# Patient Record
Sex: Female | Born: 1950 | Race: Black or African American | Hispanic: No | Smoking: Former smoker
Health system: Southern US, Community
[De-identification: ages and names within clinical notes are randomized; demographics above are authoritative.]

## PROBLEM LIST (undated history)

## (undated) DIAGNOSIS — I10 Essential (primary) hypertension: Secondary | ICD-10-CM

## (undated) DIAGNOSIS — J159 Unspecified bacterial pneumonia: Secondary | ICD-10-CM

## (undated) DIAGNOSIS — E785 Hyperlipidemia, unspecified: Secondary | ICD-10-CM

## (undated) DIAGNOSIS — R011 Cardiac murmur, unspecified: Secondary | ICD-10-CM

## (undated) DIAGNOSIS — C801 Malignant (primary) neoplasm, unspecified: Secondary | ICD-10-CM

## (undated) DIAGNOSIS — J45909 Unspecified asthma, uncomplicated: Secondary | ICD-10-CM

## (undated) DIAGNOSIS — G4730 Sleep apnea, unspecified: Secondary | ICD-10-CM

## (undated) HISTORY — DX: Essential (primary) hypertension: I10

## (undated) HISTORY — DX: Hyperlipidemia, unspecified: E78.5

## (undated) HISTORY — DX: Unspecified asthma, uncomplicated: J45.909

## (undated) HISTORY — DX: Unspecified bacterial pneumonia: J15.9

## (undated) HISTORY — DX: Sleep apnea, unspecified: G47.30

## (undated) HISTORY — DX: Morbid (severe) obesity due to excess calories: E66.01

---
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
## 2002-02-20 ENCOUNTER — Encounter: Payer: Self-pay | Admitting: Family Medicine

## 2002-02-20 ENCOUNTER — Ambulatory Visit (HOSPITAL_COMMUNITY): Admission: RE | Admit: 2002-02-20 | Discharge: 2002-02-20 | Payer: Self-pay | Admitting: Family Medicine

## 2002-09-19 ENCOUNTER — Ambulatory Visit (HOSPITAL_COMMUNITY): Admission: RE | Admit: 2002-09-19 | Discharge: 2002-09-19 | Payer: Self-pay | Admitting: Family Medicine

## 2002-09-19 ENCOUNTER — Encounter: Payer: Self-pay | Admitting: Family Medicine

## 2002-11-20 ENCOUNTER — Emergency Department (HOSPITAL_COMMUNITY): Admission: EM | Admit: 2002-11-20 | Discharge: 2002-11-20 | Payer: Self-pay | Admitting: Emergency Medicine

## 2002-12-08 ENCOUNTER — Emergency Department (HOSPITAL_COMMUNITY): Admission: EM | Admit: 2002-12-08 | Discharge: 2002-12-08 | Payer: Self-pay | Admitting: Emergency Medicine

## 2005-02-10 ENCOUNTER — Ambulatory Visit: Payer: Self-pay | Admitting: Family Medicine

## 2005-02-15 ENCOUNTER — Ambulatory Visit: Payer: Self-pay | Admitting: Family Medicine

## 2005-02-17 ENCOUNTER — Ambulatory Visit (HOSPITAL_COMMUNITY): Admission: RE | Admit: 2005-02-17 | Discharge: 2005-02-17 | Payer: Self-pay | Admitting: Family Medicine

## 2005-04-13 ENCOUNTER — Ambulatory Visit: Payer: Self-pay | Admitting: Family Medicine

## 2005-05-31 ENCOUNTER — Ambulatory Visit: Payer: Self-pay | Admitting: Family Medicine

## 2005-09-27 ENCOUNTER — Ambulatory Visit: Payer: Self-pay | Admitting: Family Medicine

## 2005-10-13 ENCOUNTER — Ambulatory Visit: Payer: Self-pay | Admitting: Family Medicine

## 2006-02-08 ENCOUNTER — Ambulatory Visit: Payer: Self-pay | Admitting: Family Medicine

## 2006-04-17 ENCOUNTER — Ambulatory Visit: Payer: Self-pay | Admitting: Family Medicine

## 2006-05-09 ENCOUNTER — Ambulatory Visit: Payer: Self-pay | Admitting: Family Medicine

## 2006-09-19 ENCOUNTER — Ambulatory Visit: Payer: Self-pay | Admitting: Family Medicine

## 2006-10-23 ENCOUNTER — Ambulatory Visit (HOSPITAL_COMMUNITY): Admission: RE | Admit: 2006-10-23 | Discharge: 2006-10-23 | Payer: Self-pay | Admitting: Family Medicine

## 2006-11-28 ENCOUNTER — Encounter: Payer: Self-pay | Admitting: Family Medicine

## 2006-11-28 LAB — CONVERTED CEMR LAB
ALT: 8 units/L (ref 0–35)
AST: 12 units/L (ref 0–37)
BUN: 11 mg/dL (ref 6–23)
Basophils Relative: 0 % (ref 0–1)
Chloride: 101 meq/L (ref 96–112)
Cholesterol: 207 mg/dL — ABNORMAL HIGH (ref 0–200)
Eosinophils Absolute: 0.2 10*3/uL (ref 0.0–0.7)
Eosinophils Relative: 3 % (ref 0–5)
Glucose, Bld: 94 mg/dL (ref 70–99)
LDL Cholesterol: 131 mg/dL — ABNORMAL HIGH (ref 0–99)
Lymphs Abs: 2.2 10*3/uL (ref 0.7–3.3)
Monocytes Absolute: 0.5 10*3/uL (ref 0.2–0.7)
Monocytes Relative: 7 % (ref 3–11)
Potassium: 4.1 meq/L (ref 3.5–5.3)
RBC: 5.19 M/uL — ABNORMAL HIGH (ref 3.87–5.11)
RDW: 15.6 % — ABNORMAL HIGH (ref 11.5–14.0)
Sodium: 139 meq/L (ref 135–145)
Total Bilirubin: 0.6 mg/dL (ref 0.3–1.2)
VLDL: 15 mg/dL (ref 0–40)
WBC: 7.6 10*3/uL (ref 4.0–10.5)

## 2006-12-04 ENCOUNTER — Ambulatory Visit: Payer: Self-pay | Admitting: Family Medicine

## 2007-05-14 ENCOUNTER — Ambulatory Visit: Payer: Self-pay | Admitting: Family Medicine

## 2007-09-25 ENCOUNTER — Ambulatory Visit: Payer: Self-pay | Admitting: Family Medicine

## 2007-09-27 DIAGNOSIS — E66813 Obesity, class 3: Secondary | ICD-10-CM | POA: Insufficient documentation

## 2007-09-27 DIAGNOSIS — Z6841 Body Mass Index (BMI) 40.0 and over, adult: Secondary | ICD-10-CM

## 2007-09-27 DIAGNOSIS — I10 Essential (primary) hypertension: Secondary | ICD-10-CM

## 2007-09-27 DIAGNOSIS — J45991 Cough variant asthma: Secondary | ICD-10-CM

## 2007-11-18 ENCOUNTER — Telehealth: Payer: Self-pay | Admitting: Family Medicine

## 2007-11-23 ENCOUNTER — Emergency Department (HOSPITAL_COMMUNITY): Admission: EM | Admit: 2007-11-23 | Discharge: 2007-11-23 | Payer: Self-pay | Admitting: Emergency Medicine

## 2008-05-13 ENCOUNTER — Ambulatory Visit: Payer: Self-pay | Admitting: Family Medicine

## 2008-05-16 ENCOUNTER — Encounter: Payer: Self-pay | Admitting: Family Medicine

## 2009-01-15 ENCOUNTER — Ambulatory Visit: Payer: Self-pay | Admitting: Family Medicine

## 2009-01-15 DIAGNOSIS — R5383 Other fatigue: Secondary | ICD-10-CM

## 2009-01-15 DIAGNOSIS — R5381 Other malaise: Secondary | ICD-10-CM

## 2009-01-15 DIAGNOSIS — H547 Unspecified visual loss: Secondary | ICD-10-CM | POA: Insufficient documentation

## 2009-01-16 ENCOUNTER — Telehealth: Payer: Self-pay | Admitting: Family Medicine

## 2009-01-16 DIAGNOSIS — B369 Superficial mycosis, unspecified: Secondary | ICD-10-CM | POA: Insufficient documentation

## 2009-01-19 ENCOUNTER — Telehealth: Payer: Self-pay | Admitting: Family Medicine

## 2009-05-26 ENCOUNTER — Encounter (INDEPENDENT_AMBULATORY_CARE_PROVIDER_SITE_OTHER): Payer: Self-pay | Admitting: *Deleted

## 2009-08-19 ENCOUNTER — Encounter: Payer: Self-pay | Admitting: Family Medicine

## 2009-08-20 ENCOUNTER — Inpatient Hospital Stay (HOSPITAL_COMMUNITY): Admission: EM | Admit: 2009-08-20 | Discharge: 2009-08-22 | Payer: Self-pay | Admitting: Emergency Medicine

## 2009-08-22 ENCOUNTER — Encounter: Payer: Self-pay | Admitting: Family Medicine

## 2009-08-24 ENCOUNTER — Telehealth: Payer: Self-pay | Admitting: Family Medicine

## 2009-08-26 ENCOUNTER — Telehealth: Payer: Self-pay | Admitting: Physician Assistant

## 2009-08-27 ENCOUNTER — Encounter: Payer: Self-pay | Admitting: Family Medicine

## 2009-09-10 ENCOUNTER — Ambulatory Visit: Payer: Self-pay | Admitting: Family Medicine

## 2009-09-10 DIAGNOSIS — J159 Unspecified bacterial pneumonia: Secondary | ICD-10-CM | POA: Insufficient documentation

## 2009-09-10 DIAGNOSIS — G4730 Sleep apnea, unspecified: Secondary | ICD-10-CM | POA: Insufficient documentation

## 2009-09-11 ENCOUNTER — Encounter: Payer: Self-pay | Admitting: Family Medicine

## 2009-09-14 ENCOUNTER — Telehealth: Payer: Self-pay | Admitting: Family Medicine

## 2009-09-21 ENCOUNTER — Telehealth: Payer: Self-pay | Admitting: Family Medicine

## 2009-10-26 ENCOUNTER — Telehealth: Payer: Self-pay | Admitting: Family Medicine

## 2009-12-15 ENCOUNTER — Emergency Department (HOSPITAL_COMMUNITY): Admission: EM | Admit: 2009-12-15 | Discharge: 2009-12-15 | Payer: Self-pay | Admitting: Emergency Medicine

## 2009-12-28 ENCOUNTER — Telehealth: Payer: Self-pay | Admitting: Family Medicine

## 2010-01-11 ENCOUNTER — Ambulatory Visit: Payer: Self-pay | Admitting: Family Medicine

## 2010-01-11 DIAGNOSIS — R7301 Impaired fasting glucose: Secondary | ICD-10-CM

## 2010-01-12 ENCOUNTER — Encounter: Payer: Self-pay | Admitting: Family Medicine

## 2010-01-14 LAB — CONVERTED CEMR LAB
Basophils Absolute: 0 10*3/uL (ref 0.0–0.1)
Calcium: 10 mg/dL (ref 8.4–10.5)
Chloride: 101 meq/L (ref 96–112)
Cholesterol: 205 mg/dL — ABNORMAL HIGH (ref 0–200)
Glucose, Bld: 101 mg/dL — ABNORMAL HIGH (ref 70–99)
HCT: 42.6 % (ref 36.0–46.0)
HDL: 50 mg/dL (ref >39)
Hemoglobin: 13.7 g/dL (ref 12.0–15.0)
LDL Cholesterol: 125 mg/dL — ABNORMAL HIGH (ref 0–99)
Lymphocytes Relative: 31 % (ref 12–46)
MCHC: 32.2 g/dL (ref 30.0–36.0)
Potassium: 4.2 meq/L (ref 3.5–5.3)
TSH: 1.609 microintl units/mL (ref 0.350–4.500)
Triglycerides: 149 mg/dL (ref <150)

## 2010-05-17 ENCOUNTER — Encounter: Payer: Self-pay | Admitting: Family Medicine

## 2010-05-24 ENCOUNTER — Ambulatory Visit: Payer: Self-pay | Admitting: Family Medicine

## 2010-05-31 ENCOUNTER — Encounter: Payer: Self-pay | Admitting: Family Medicine

## 2010-05-31 LAB — CONVERTED CEMR LAB: Hgb A1c MFr Bld: 5.6 % (ref <5.7)

## 2010-07-04 ENCOUNTER — Encounter: Payer: Self-pay | Admitting: Family Medicine

## 2010-07-05 ENCOUNTER — Encounter: Payer: Self-pay | Admitting: Family Medicine

## 2010-07-13 NOTE — Assessment & Plan Note (Signed)
Summary: office visit   Vital Signs:  Patient profile:   60 year old female Menstrual status:  postmenopausal Height:      65 inches Weight:      345 pounds BMI:     57.62 O2 Sat:      92 % Pulse rate:   78 / minute Pulse rhythm:   regular Resp:     16 per minute BP sitting:   130 / 78  (left arm) Cuff size:   xl  Vitals Entered By: Everitt Amber LPN (September 10, 2009 3:11 PM)  Nutrition Counseling: Patient's BMI is greater than 25 and therefore counseled on weight management options. CC: ER follow up for pneumonia   CC:  ER follow up for pneumonia.  History of Present Illness: Pt hospitalised March 9 to 12 for left lower lobe pneumonia, she was also d/cd on oxygen for use with ambulation through advanced.  She has gained weight, and states she has neither been exercising or sticking with a diet to lose weight. She denies depression or anxiety. Shedenies wheezing or chest congestion with productive cough. She has some stiffness of the large joints as wel as back pain. Sehc/o high cost of her inhalers and is requesting help.  Pt aMBULATED IN THE OFFICE AND DESATRATED UNDER 90%WITHIN 3 MONUTES. She reports excessive snoring and daytime sleepiness, needs sleep study  Current Medications (verified): 1)  Proventil Hfa 108 (90 Base) Mcg/act  Aers (Albuterol Sulfate) .... Two Puffs Every Four To Six Hours As Needed 2)  Symbicort 80-4.5 Mcg/act  Aero (Budesonide-Formoterol Fumarate) .... Two Puffs Two Times A Day 3)  Maxzide-25 37.5-25 Mg Tabs (Triamterene-Hctz) .... Take 1 Tablet By Mouth Once A Day  Allergies (verified): 1)  ! * Tessalon Perles 2)  ! * Altace  Review of Systems      See HPI General:  Complains of fatigue. Eyes:  Denies blurring and discharge. ENT:  Denies nasal congestion, postnasal drainage, and sinus pressure. CV:  Denies chest pain or discomfort, palpitations, and swelling of feet. GI:  Denies abdominal pain, constipation, diarrhea, nausea, and  vomiting. GU:  Denies dysuria and urinary frequency. MS:  See HPI. Endo:  Denies cold intolerance, excessive hunger, excessive thirst, excessive urination, heat intolerance, polyuria, and weight change. Heme:  Denies abnormal bruising and bleeding. Allergy:  Complains of seasonal allergies.  Physical Exam  General:  alert, well-hydrated, and overweight-appearing.  HEENT: No facial asymmetry,  EOMI, No sinus tenderness, TM's Clear, oropharynx  pink and moist.   Chest:adequate airentry throughout, no crackles or wheezes CVS: S1, S2, No murmurs, No S3.   Abd: Soft, Nontender.  MS: Adequate ROM spine, hips, shoulders and knees.  Ext: No edema.   CNS: CN 2-12 intact, power tone and sensation normal throughout.   Skinno breakldown or exudate Psych: Good eye contact, normal affect.  Memory intact, not anxious or depressed appearing.     Impression & Recommendations:  Problem # 1:  BACTERIAL PNEUMONIA (ICD-482.9) Assessment Improved  Orders: CXR- 2view (CXR), rept between 4 to 6 weeks after presentation to ensure clearing  Problem # 2:  ASTHMA (ICD-493.90) Assessment: Improved  The following medications were removed from the medication list:    Albuterol Sulfate (2.5 Mg/77ml) 0.083% Nebu (Albuterol sulfate) ..... One q 4 hrs as needed via nebulizer Her updated medication list for this problem includes:    Proventil Hfa 108 (90 Base) Mcg/act Aers (Albuterol sulfate) .Marland Kitchen..Marland Kitchen Two puffs every four to six hours as needed  Symbicort 80-4.5 Mcg/act Aero (Budesonide-formoterol fumarate) .Marland Kitchen..Marland Kitchen Two puffs two times a day  Problem # 3:  HYPERTENSION (ICD-401.9) Assessment: Unchanged  Her updated medication list for this problem includes:    Maxzide-25 37.5-25 Mg Tabs (Triamterene-hctz) .Marland Kitchen... Take 1 tablet by mouth once a day  BP today: 130/78 Prior BP: 110/80 (01/15/2009)  Labs Reviewed: K+: 4.1 (11/28/2006) Creat: : 0.64 (11/28/2006)   Chol: 207 (11/28/2006)   HDL: 61 (11/28/2006)    LDL: 131 (11/28/2006)   TG: 73 (11/28/2006)  Problem # 4:  OBESITY (ICD-278.00) Assessment: Deteriorated  Ht: 65 (09/10/2009)   Wt: 345 (09/10/2009)   BMI: 57.62 (09/10/2009)  Complete Medication List: 1)  Proventil Hfa 108 (90 Base) Mcg/act Aers (Albuterol sulfate) .... Two puffs every four to six hours as needed 2)  Symbicort 80-4.5 Mcg/act Aero (Budesonide-formoterol fumarate) .... Two puffs two times a day 3)  Maxzide-25 37.5-25 Mg Tabs (Triamterene-hctz) .... Take 1 tablet by mouth once a day  Other Orders: Neurology Referral (Neuro)  Patient Instructions: 1)  F/U end April 2)  You will be referred for a sleep study I believe you have sleep apnea. 3)  You need a CXr  September 21, 2009 4)  Continue current meds  Patient walked up and down hall 4 times and her o2 dropped to 88%.

## 2010-07-13 NOTE — Assessment & Plan Note (Signed)
Summary: office visit   Vital Signs:  Patient profile:   60 year old female Menstrual status:  postmenopausal Height:      65 inches Weight:      335 pounds BMI:     55.95 O2 Sat:      94 % Pulse rate:   68 / minute Pulse rhythm:   regular Resp:     16 per minute BP sitting:   122 / 80  (left arm) Cuff size:   xl   Vitals Entered By: Everitt Amber LPN (January 11, 2010 8:16 AM)  Nutrition Counseling: Patient's BMI is greater than 25 and therefore counseled on weight management options. CC: ER follow up, pneumonia in left lung   CC:  ER follow up and pneumonia in left lung.  History of Present Illness: Brandy Dean is in for routine f/u as well as f/u from a hospitalisation in March for bacterial pneumonia. sdhe clearly missed the initial f/u appt and needs a cXR. She denies any current cough , sputum, fevr or chills. She has no wheezingf, and no longer smokes.ng on dietary change and has succeeded in a 10 pound weight loss. She still is not exerciing. She does have some styressd and mild depression, however , much less han beforej  Current Medications (verified): 1)  Proventil Hfa 108 (90 Base) Mcg/act  Aers (Albuterol Sulfate) .... Two Puffs Every Four To Six Hours As Needed 2)  Symbicort 80-4.5 Mcg/act  Aero (Budesonide-Formoterol Fumarate) .... Two Puffs Two Times A Day 3)  Maxzide-25 37.5-25 Mg Tabs (Triamterene-Hctz) .... Take 1 Tablet By Mouth Once A Day 4)  Albuterol Sulfate (2.5 Mg/35ml) 0.083% Nebu (Albuterol Sulfate) .... One Vial Per Nebulizer Every 4 Hours As Needed 5)  Clotrimazole-Betamethasone 1-0.05 % Crea (Clotrimazole-Betamethasone) .... Apply Two Times A Day As Needed To Affected Areas  Allergies (verified): 1)  ! * Tessalon Perles 2)  ! * Altace  Past History:  Past medical, surgical, family and social histories (including risk factors) reviewed for relevance to current acute and chronic problems.  Past Medical History: Hypertension Morbid Obesity Bronchial  asthma Nicotine addiction.quit in 2009 Prob sleep apnea  Past Surgical History: Reviewed history from 09/27/2007 and no changes required. Caesarean section x2 (1988 and 1991) ? Right and Left keloid ears? 1968 Partial left lobectomy? 1968  Family History: Reviewed history from 09/27/2007 and no changes required. Mom deceased CVA,Htn, Dad deceased Esophgeal cancer Sister 1 obesity Brother 2 healthy  Social History: Reviewed history from 09/27/2007 and no changes required. employed separated x 2 years(2009) 3 children Quit smoking since 2007  Alcohol use-no Drug use-no  Review of Systems      See HPI General:  Complains of fatigue and sleep disorder; denies chills, loss of appetite, and malaise; classic signs of sleep apnea, needs study. Eyes:  Denies blurring and discharge. ENT:  Denies hoarseness, nasal congestion, sinus pressure, and sore throat. CV:  Complains of shortness of breath with exertion; denies chest pain or discomfort, palpitations, and swelling of feet. Resp:  Complains of shortness of breath; denies cough, sputum productive, and wheezing. GI:  Denies abdominal pain, constipation, diarrhea, nausea, and vomiting. GU:  Denies dysuria and urinary frequency. Brandy:  Denies joint pain and stiffness. Derm:  Denies itching and rash. Neuro:  Denies headaches and seizures. Psych:  Complains of anxiety and depression; denies easily tearful, irritability, mental problems, suicidal thoughts/plans, thoughts of violence, and unusual visions or sounds. Endo:  Denies excessive thirst and heat intolerance. Heme:  Denies abnormal bruising and bleeding. Allergy:  Complains of seasonal allergies.  Physical Exam  General:  Well-developed,morbidly obese,in no acute distress; alert,appropriate and cooperative throughout examination HEENT: No facial asymmetry,  EOMI, No sinus tenderness, TM's Clear, oropharynx  pink and moist.   Chest: Clear to auscultation bilaterally.  CVS: S1,  S2, No murmurs, No S3.   Abd: Soft, Nontender.  Brandy: Adequate ROM spine, hips, shoulders and knees.  Ext: No edema.   CNS: CN 2-12 intact, power tone and sensation normal throughout.   Skin: Intact, no visible lesions or rashes.  Psych: Good eye contact, normal affect.  Memory intact, not anxious or depressed appearing.    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  Problem # 2:  BACTERIAL PNEUMONIA (ICD-482.9) Assessment: Improved  Orders: CXR- 2view (CXR)  Problem # 3:  SLEEP APNEA (ICD-780.57) Assessment: Comment Only pt to reschedule study  Problem # 4:  OBESITY (ICD-278.00) Assessment: Improved  Ht: 65 (01/11/2010)   Wt: 335 (01/11/2010)   BMI: 55.95 (01/11/2010)  Problem # 5:  HYPERTENSION (ICD-401.9) Assessment: Unchanged  Her updated medication list for this problem includes:    Maxzide-25 37.5-25 Mg Tabs (Triamterene-hctz) .Marland Kitchen... Take 1 tablet by mouth once a day  Orders: T-Basic Metabolic Panel [phone])  BP today: 122/80 Prior BP: 130/78 (09/10/2009)  Labs Reviewed: K+: 4.1 (11/28/2006) Creat: : 0.64 (11/28/2006)   Chol: 207 (11/28/2006)   HDL: 61 (11/28/2006)   LDL: 131 (11/28/2006)   TG: 73 (11/28/2006)  Problem # 6:  ASTHMA (ICD-493.90) Assessment: Unchanged  Her updated medication list for this problem includes:    Proventil Hfa 108 (90 Base) Mcg/act Aers (Albuterol sulfate) .Marland Kitchen..Marland Kitchen Two puffs every four to six hours as needed    Symbicort 80-4.5 Mcg/act Aero (Budesonide-formoterol fumarate) .Marland Kitchen..Marland Kitchen Two puffs two times a day    Albuterol Sulfate (2.5 Mg/70ml) 0.083% Nebu (Albuterol sulfate) ..... One vial per nebulizer every 4 hours as needed  Complete Medication List: 1)  Proventil Hfa 108 (90 Base) Mcg/act Aers (Albuterol sulfate) .... Two puffs every four to six  hours as needed 2)  Symbicort 80-4.5 Mcg/act Aero (Budesonide-formoterol fumarate) .... Two puffs two times a day 3)  Maxzide-25 37.5-25 Mg Tabs (Triamterene-hctz) .... Take 1 tablet by mouth once a day 4)  Albuterol Sulfate (2.5 Mg/25ml) 0.083% Nebu (Albuterol sulfate) .... One vial per nebulizer every 4 hours as needed 5)  Clotrimazole-betamethasone 1-0.05 % Crea (Clotrimazole-betamethasone) .... Apply two times a day as needed to affected areas  Other Orders: T-Lipid Profile ([id number]) T-TSH [phone]) T-CBC w/Diff [id number]) Radiology Referral (Radiology)  Patient Instructions: 1)  Please schedule a follow-up appointment in 4 months. 2)  It is important that you exercise regularly at least 20 minutes 5 times a week. If you develop chest pain, have severe difficulty breathing, or feel very tired , stop exercising immediately and seek medical attention. 3)  You need to lose weight. Consider a lower calorie diet and regular exercise.  4)  BMP prior to visit, ICD-9: 5)  Lipid Panel prior to visit, ICD-9: 6)  TSH prior to visit, ICD-9:   fasting today 7)  CBC w/ Diff prior to visit, ICD-9: 8)  HbgA1C prior to visit, ICD-9: 9)  mamo will be scheduled, also you need a cxr to ensure the pnemonia is cleared. 10)  pls contact Dr Ronal Fear office  and attempt to reschedule your sleep study, you absolutely need it. Prescriptions: MAXZIDE-25 37.5-25 MG TABS (TRIAMTERENE-HCTZ) Take 1 tablet by mouth once a day  #30.0 Each x 3   Entered by:   Everitt Amber LPN   Authorized by:   Syliva Overman MD   Signed by:   Everitt Amber LPN on 16/03/9603   Method used:   Electronically to        [facility] [address] [phone]* (retail)       [address] [place], [place]  [address]       Ph: [phone]       Fax: [phone]   RxID:   [id number] SYMBICORT 80-4.5 MCG/ACT  AERO (BUDESONIDE-FORMOTEROL FUMARATE) two puffs two times a day  #1 x 3   Entered by:   Everitt Amber LPN   Authorized  by:   Syliva Overman MD   Signed by:   Everitt Amber LPN on 53/66/4403   Method used:   Electronically to        [facility] [address] [phone]* (retail)       [address] [place], [place]  [address]       Ph: [phone]       Fax: [phone]   RxID:   [id number] PROVENTIL HFA 108 (90 BASE) MCG/ACT  AERS (ALBUTEROL SULFATE) two puffs every four to six hours as needed  #1 x 3   Entered by:   Everitt Amber LPN   Authorized by:   Syliva Overman MD   Signed by:   Everitt Amber LPN on 25/42/7062   Method used:   Electronically to        [facility] [address] [phone]* (retail)       [address] [place]  [address]       Ph: [phone]       Fax: [phone]   RxID:   [id number]

## 2010-07-13 NOTE — Progress Notes (Signed)
Summary: rx  Phone Note Call from Patient   Summary of Call: pt needs to get albutrol for neb. [facility] [phone] Initial call taken by: Rudene Anda,  August 24, 2009 10:38 AM  Follow-up for Phone Call        refill x1 only, advise her she needs ov , she is asthmatic see old med list pls Follow-up by: Adella Hare LPN,  August 24, 2009 10:57 AM  Additional Follow-up for Phone Call Additional follow up Details #1::        there is no nebulizer meds on med list past or present Additional Follow-up by: Adella Hare LPN,  August 24, 2009 2:44 PM    Additional Follow-up for Phone Call Additional follow up Details #2::    I sent prescription to pharmacy for albuterol inhalation soln for nebulizer treatments. Pls make sure pt schedules an appt  Follow-up by: Esperanza Sheets PA,  August 24, 2009 3:19 PM  Additional Follow-up for Phone Call Additional follow up Details #3:: Details for Additional Follow-up Action Taken: appt 3/24 Additional Follow-up by: Adella Hare LPN,  August 24, 2009 4:24 PM  New/Updated Medications: ALBUTEROL SULFATE (2.5 MG/3ML) 0.083% NEBU (ALBUTEROL SULFATE) one q 4 hrs as needed via nebulizer Prescriptions: ALBUTEROL SULFATE (2.5 MG/3ML) 0.083% NEBU (ALBUTEROL SULFATE) one q 4 hrs as needed via nebulizer  #100 x 0   Entered and Authorized by:   Esperanza Sheets PA   Signed by:   Esperanza Sheets PA on 08/24/2009   Method used:   Electronically to        [facility] [address] [phone]* (retail)       [address], [place]  [address]       Ph: [phone]       Fax: [phone]   RxID:   [id number]

## 2010-07-13 NOTE — Progress Notes (Signed)
Summary: NO SHOWS  Phone Note Call from Patient   Summary of Call: DR. Harriett Sine OFFICE CALLED AND SAID SHE DID NOT AGAIN TODAY THAT IS THREE NO SHOWS Initial call taken by: Lind Guest,  December 28, 2009 4:40 PM  Follow-up for Phone Call        noted Follow-up by: Syliva Overman MD,  December 28, 2009 5:35 PM

## 2010-07-13 NOTE — Letter (Signed)
Summary: 2nd missed letter  2nd missed letter   Imported By: Lind Guest 05/19/2010 10:49:41  _____________________________________________________________________  External Attachment:    Type:   Image     Comment:   External Document

## 2010-07-13 NOTE — Progress Notes (Signed)
Summary: MASK AND TUBING  Phone Note Call from Patient   Summary of Call: NEEDS  MASK AND TUBING FOR THE NEUB. SEND TO [place] [facility] Initial call taken by: Lind Guest,  August 26, 2009 11:08 AM  Follow-up for Phone Call        if you want to give can you hand write this out for me? Follow-up by: Everitt Amber LPN,  August 26, 2009 11:17 AM  Additional Follow-up for Phone Call Additional follow up Details #1::        Handwritten Additional Follow-up by: Esperanza Sheets PA,  August 26, 2009 11:27 AM

## 2010-07-13 NOTE — Medication Information (Signed)
Summary: Tax adviser   Imported By: Lind Guest 08/27/2009 07:59:34  _____________________________________________________________________  External Attachment:    Type:   Image     Comment:   External Document

## 2010-07-13 NOTE — Letter (Signed)
Summary: handicapp card  handicapp card   Imported By: Lind Guest 09/11/2009 13:40:03  _____________________________________________________________________  External Attachment:    Type:   Image     Comment:   External Document

## 2010-07-13 NOTE — Letter (Signed)
Summary: Discharge Summary  Discharge Summary   Imported By: Lind Guest 09/11/2009 08:30:14  _____________________________________________________________________  External Attachment:    Type:   Image     Comment:   External Document

## 2010-07-13 NOTE — Letter (Signed)
Summary: asking patient to contact our office  asking patient to contact our office   Imported By: Curtis Sites 01/14/2010 14:34:50  _____________________________________________________________________  External Attachment:    Type:   Image     Comment:   External Document

## 2010-07-13 NOTE — Progress Notes (Signed)
Summary: NO SHOW  Phone Note Call from Patient   Summary of Call: WAS A NO SHOW AT DR. Harriett Sine TODAY Initial call taken by: Lind Guest,  Oct 26, 2009 4:40 PM  Follow-up for Phone Call        noted Follow-up by: Syliva Overman MD,  Oct 26, 2009 4:43 PM

## 2010-07-13 NOTE — Progress Notes (Signed)
  Phone Note From Pharmacy   Caller: advanced Summary of Call: recieved order for an overnite for overnight pulse ox to see if she qualifies for nocturnal oxygen, however patient is already on 3 liters continuously, day and night  [phone] darilyn Initial call taken by: Adella Hare LPN,  September 14, 2009 9:21 AM  Follow-up for Phone Call        oxernight order cancelled. Patient on continuous per dr Sherrie Mustache Follow-up by: Everitt Amber LPN,  September 14, 2009 2:43 PM

## 2010-07-13 NOTE — Letter (Signed)
Summary: home health advance  home health advance   Imported By: Lind Guest 09/11/2009 13:40:49  _____________________________________________________________________  External Attachment:    Type:   Image     Comment:   External Document

## 2010-07-13 NOTE — Progress Notes (Signed)
Summary: advance  Phone Note Call from Patient   Summary of Call: pt has a question about advance coming out to do her overnight pulse [phone] Initial call taken by: Rudene Anda,  September 21, 2009 11:38 AM  Follow-up for Phone Call        returned call, left message Follow-up by: Adella Hare LPN,  September 21, 2009 3:35 PM  Additional Follow-up for Phone Call Additional follow up Details #1::        patient aware Additional Follow-up by: Adella Hare LPN,  September 21, 2009 4:33 PM

## 2010-07-15 NOTE — Assessment & Plan Note (Signed)
Summary: office visit   Vital Signs:  Patient profile:   60 year old female Menstrual status:  postmenopausal Height:      65 inches Weight:      336.75 pounds BMI:     56.24 O2 Sat:      97 % on Room air Pulse rate:   72 / minute Pulse rhythm:   regular Resp:     16 per minute BP sitting:   110 / 82  (left arm)  Vitals Entered By: Adella Hare LPN (May 31, 2010 5:01 PM)  Nutrition Counseling: Patient's BMI is greater than 25 and therefore counseled on weight management options.  O2 Flow:  Room air CC: follow-up visit Is Patient Diabetic? No Comments did not bring meds but states med list is accurate   CC:  follow-up visit.  History of Present Illness: Reports  that she has been doing well She unfortunately has not been persitent in her weight loss efforts, and her weightis unchanged.she intends to change this.. Denies recent fever or chills. Denies sinus pressure, nasal congestion , ear pain or sore throat. Denies chest congestion, or cough productive of sputum. She has had no recent astma flare Denies chest pain, palpitations, PND, orthopnea or leg swelling. Denies abdominal pain, nausea, vomitting, diarrhea or constipation. Denies change in bowel movements or bloody stool. Denies dysuria , frequency, incontinence or hesitancy. Denies  joint pain, swelling, or reduced mobility. Denies headaches, vertigo, seizures. Denies depression, anxiety or insomnia. Denies  rash, lesions, or itch.     Current Medications (verified): 1)  Proventil Hfa 108 (90 Base) Mcg/act  Aers (Albuterol Sulfate) .... Two Puffs Every Four To Six Hours As Needed 2)  Symbicort 80-4.5 Mcg/act  Aero (Budesonide-Formoterol Fumarate) .... Two Puffs Two Times A Day 3)  Maxzide-25 37.5-25 Mg Tabs (Triamterene-Hctz) .... Take 1 Tablet By Mouth Once A Day 4)  Albuterol Sulfate (2.5 Mg/63ml) 0.083% Nebu (Albuterol Sulfate) .... One Vial Per Nebulizer Every 4 Hours As Needed 5)   Clotrimazole-Betamethasone 1-0.05 % Crea (Clotrimazole-Betamethasone) .... Apply Two Times A Day As Needed To Affected Areas  Allergies (verified): 1)  ! * Tessalon Perles 2)  ! * Altace  Past History:  Past medical history reviewed for relevance to current acute and chronic problems.  Past Medical History: Hypertension Morbid Obesity Bronchial asthma Nicotine addiction.quit in 2009 Prob sleep apnea hospitalised March 9 to 12,2011 with bacterial pneumonia  Review of Systems      See HPI Eyes:  Denies blurring and discharge. Endo:  Denies cold intolerance, excessive hunger, excessive thirst, excessive urination, and heat intolerance. Heme:  Denies abnormal bruising, bleeding, and enlarge lymph nodes. Allergy:  Complains of seasonal allergies; denies hives or rash and itching eyes.  Physical Exam  General:  Well-developed,morbidly obese,in no acute distress; alert,appropriate and cooperative throughout examination HEENT: No facial asymmetry,  EOMI, No sinus tenderness, TM's Clear, oropharynx  pink and moist.   Chest: Clear to auscultation bilaterally.  CVS: S1, S2, No murmurs, No S3.   Abd: Soft, Nontender.  MS: Adequate ROM spine, hips, shoulders and knees.  Ext: No edema.   CNS: CN 2-12 intact, power tone and sensation normal throughout.   Skin: Intact, no visible lesions or rashes.  Psych: Good eye contact, normal affect.  Memory intact, not anxious or depressed appearing.    Impression & Recommendations:  Problem # 1:  IMPAIRED FASTING GLUCOSE (ICD-790.21) Assessment Comment Only  Orders: T- Hemoglobin A1C (16109-60454), was 6.1 in sept 2011 Pt advised  to reduce carbohydrate intake, espescially sweets, and to start regular physical activity, at least 30 minutes 5 days weekly, to enable weight loss, and reduce the risk of becoming diabetic   Problem # 2:  OBESITY (ICD-278.00) Assessment: Unchanged  Ht: 65 (05/31/2010)   Wt: 336.75 (05/31/2010)   BMI: 56.24  (05/31/2010) therapeutic lifestyle change discussed and encouraged  Problem # 3:  HYPERTENSION (ICD-401.9) Assessment: Improved  Her updated medication list for this problem includes:    Maxzide-25 37.5-25 Mg Tabs (Triamterene-hctz) .Marland Kitchen... Take 1 tablet by mouth once a day  BP today: 110/82 Prior BP: 122/80 (01/11/2010)  Labs Reviewed: K+: 4.2 (01/11/2010) Creat: : 0.61 (01/11/2010)   Chol: 205 (01/11/2010)   HDL: 50 (01/11/2010)   LDL: 125 (01/11/2010)   TG: 149 (01/11/2010)  Complete Medication List: 1)  Proventil Hfa 108 (90 Base) Mcg/act Aers (Albuterol sulfate) .... Two puffs every four to six hours as needed 2)  Symbicort 80-4.5 Mcg/act Aero (Budesonide-formoterol fumarate) .... Two puffs two times a day 3)  Maxzide-25 37.5-25 Mg Tabs (Triamterene-hctz) .... Take 1 tablet by mouth once a day 4)  Albuterol Sulfate (2.5 Mg/20ml) 0.083% Nebu (Albuterol sulfate) .... One vial per nebulizer every 4 hours as needed 5)  Clotrimazole-betamethasone 1-0.05 % Crea (Clotrimazole-betamethasone) .... Apply two times a day as needed to affected areas  Patient Instructions: 1)  Please schedule a follow-up appointment in 4 months. 2)  It is important that you exercise regularly at least 20 minutes 5 times a week. If you develop chest pain, have severe difficulty breathing, or feel very tired , stop exercising immediately and seek medical attention. 3)  You need to lose weight. Consider a lower calorie diet and regular exercise.  4)  Pt advised to reduce carbohydrate intake, espescially sweets, and to start regular physical activity, at least 30 minutes 5 days weekly, to enable weight loss, and reduce the risk of becoming diabetic  5)  HbgA1C prior to visit, ICD-9:  today 6)  [place] is due pls schedule   Orders Added: 1)  Est. Patient Level IV [[address]109] 2)  T- Hemoglobin A1C [83036-23375]   symbicort 80/4.5 samples given [id number] D00 03/13

## 2010-07-30 ENCOUNTER — Emergency Department (HOSPITAL_COMMUNITY): Payer: BC Managed Care – PPO

## 2010-07-30 ENCOUNTER — Emergency Department (HOSPITAL_COMMUNITY)
Admission: EM | Admit: 2010-07-30 | Discharge: 2010-07-30 | Disposition: A | Discharge status: Home / Self Care | Payer: BC Managed Care – PPO | Attending: Emergency Medicine | Admitting: Emergency Medicine

## 2010-07-30 DIAGNOSIS — R059 Cough, unspecified: Secondary | ICD-10-CM | POA: Insufficient documentation

## 2010-07-30 DIAGNOSIS — J45909 Unspecified asthma, uncomplicated: Secondary | ICD-10-CM | POA: Insufficient documentation

## 2010-07-30 DIAGNOSIS — R0789 Other chest pain: Secondary | ICD-10-CM | POA: Insufficient documentation

## 2010-07-30 DIAGNOSIS — R05 Cough: Secondary | ICD-10-CM | POA: Insufficient documentation

## 2010-07-30 DIAGNOSIS — R0609 Other forms of dyspnea: Secondary | ICD-10-CM | POA: Insufficient documentation

## 2010-07-30 DIAGNOSIS — R0602 Shortness of breath: Secondary | ICD-10-CM | POA: Insufficient documentation

## 2010-07-30 DIAGNOSIS — E669 Obesity, unspecified: Secondary | ICD-10-CM | POA: Insufficient documentation

## 2010-07-30 DIAGNOSIS — I10 Essential (primary) hypertension: Secondary | ICD-10-CM | POA: Insufficient documentation

## 2010-07-30 DIAGNOSIS — R0989 Other specified symptoms and signs involving the circulatory and respiratory systems: Secondary | ICD-10-CM | POA: Insufficient documentation

## 2010-08-17 ENCOUNTER — Telehealth (INDEPENDENT_AMBULATORY_CARE_PROVIDER_SITE_OTHER): Payer: Self-pay | Admitting: *Deleted

## 2010-08-24 ENCOUNTER — Telehealth (INDEPENDENT_AMBULATORY_CARE_PROVIDER_SITE_OTHER): Payer: Self-pay | Admitting: *Deleted

## 2010-08-24 NOTE — Progress Notes (Signed)
Summary: sample  Phone Note Call from Patient   Summary of Call: pt wants to know if we have a sample of symcort. [phone] Initial call taken by: Rudene Anda,  August 17, 2010 3:47 PM  Follow-up for Phone Call        advised we are expecting samples early next week and will call her Follow-up by: Everitt Amber LPN,  August 19, 2010 4:15 PM

## 2010-08-26 ENCOUNTER — Telehealth: Payer: Self-pay | Admitting: Family Medicine

## 2010-08-31 NOTE — Progress Notes (Signed)
  Phone Note Call from Patient   Summary of Call: Pt called in wanting Symbicort. I told her we were expecting it anytime. She said her asthma was flaring up without it and we did not have any openings per [facility] so patient was going to go to the ER and I told her that as soon as the samples came I would call her  Called back and said she had a coupon for free symbicort and to send rx to [facility] Initial call taken by: Everitt Amber LPN,  August 24, 2010 9:53 AM    Prescriptions: SYMBICORT 80-4.5 MCG/ACT  AERO (BUDESONIDE-FORMOTEROL FUMARATE) two puffs two times a day  #1 x 0   Entered by:   Everitt Amber LPN   Authorized by:   Syliva Overman MD   Signed by:   Everitt Amber LPN on 56/21/3086   Method used:   Electronically to        [facility] [address] [phone]* (retail)       [address], [place]  [address]       Ph: [phone]       Fax: [phone]   RxID:   [id number]

## 2010-08-31 NOTE — Progress Notes (Signed)
Summary: bp medicine  Phone Note Call from Patient   Summary of Call: send  bp medicine to [facility] in [place] Initial call taken by: Lind Guest,  August 26, 2010 3:10 PM    Prescriptions: MAXZIDE-25 37.5-25 MG TABS (TRIAMTERENE-HCTZ) Take 1 tablet by mouth once a day  #30.0 Each x 1   Entered by:   Adella Hare LPN   Authorized by:   Syliva Overman MD   Signed by:   Adella Hare LPN on 16/03/9603   Method used:   Electronically to        [facility]  [address] (retail)       [address]       [place], [place]  [address]       Ph: [phone]       Fax: [phone]   RxID:   [id number]

## 2010-09-05 LAB — CBC
Hemoglobin: 12.9 g/dL (ref 12.0–15.0)
Hemoglobin: 13.5 g/dL (ref 12.0–15.0)
MCHC: 34.3 g/dL (ref 30.0–36.0)
MCV: 82.5 fL (ref 78.0–100.0)
RBC: 4.57 MIL/uL (ref 3.87–5.11)
RDW: 15.2 % (ref 11.5–15.5)
RDW: 15.7 % — ABNORMAL HIGH (ref 11.5–15.5)
WBC: 10.8 10*3/uL — ABNORMAL HIGH (ref 4.0–10.5)
WBC: 9.8 10*3/uL (ref 4.0–10.5)

## 2010-09-05 LAB — COMPREHENSIVE METABOLIC PANEL
ALT: 12 U/L (ref 0–35)
CO2: 30 mEq/L (ref 19–32)
Calcium: 9.8 mg/dL (ref 8.4–10.5)
Creatinine, Ser: 0.53 mg/dL (ref 0.4–1.2)
Potassium: 4.4 mEq/L (ref 3.5–5.1)
Sodium: 138 mEq/L (ref 135–145)
Total Protein: 8 g/dL (ref 6.0–8.3)

## 2010-09-05 LAB — DIFFERENTIAL
Basophils Relative: 0 % (ref 0–1)
Eosinophils Relative: 0 % (ref 0–5)
Lymphocytes Relative: 30 % (ref 12–46)
Lymphs Abs: 0.8 10*3/uL (ref 0.7–4.0)
Lymphs Abs: 2.9 10*3/uL (ref 0.7–4.0)
Monocytes Absolute: 0.3 10*3/uL (ref 0.1–1.0)
Monocytes Relative: 3 % (ref 3–12)
Monocytes Relative: 8 % (ref 3–12)
Neutro Abs: 5.8 10*3/uL (ref 1.7–7.7)
Neutro Abs: 9.6 10*3/uL — ABNORMAL HIGH (ref 1.7–7.7)

## 2010-09-05 LAB — GLUCOSE, CAPILLARY
Glucose-Capillary: 100 mg/dL — ABNORMAL HIGH (ref 70–99)
Glucose-Capillary: 109 mg/dL — ABNORMAL HIGH (ref 70–99)
Glucose-Capillary: 116 mg/dL — ABNORMAL HIGH (ref 70–99)
Glucose-Capillary: 132 mg/dL — ABNORMAL HIGH (ref 70–99)
Glucose-Capillary: 176 mg/dL — ABNORMAL HIGH (ref 70–99)

## 2010-09-05 LAB — BASIC METABOLIC PANEL
BUN: 12 mg/dL (ref 6–23)
CO2: 33 mEq/L — ABNORMAL HIGH (ref 19–32)
Calcium: 9.4 mg/dL (ref 8.4–10.5)
Chloride: 98 mEq/L (ref 96–112)
Creatinine, Ser: 0.64 mg/dL (ref 0.4–1.2)
GFR calc non Af Amer: >60 mL/min (ref >60)

## 2010-09-05 LAB — POCT CARDIAC MARKERS
CKMB, poc: 1.6 ng/mL (ref 1.0–8.0)
Myoglobin, poc: 57 ng/mL (ref 12–200)

## 2010-09-05 LAB — T4, FREE: Free T4: 1.2 ng/dL (ref 0.80–1.80)

## 2010-09-30 ENCOUNTER — Encounter: Payer: Self-pay | Admitting: Family Medicine

## 2010-10-01 ENCOUNTER — Encounter: Payer: Self-pay | Admitting: Family Medicine

## 2010-10-04 ENCOUNTER — Ambulatory Visit: Payer: Self-pay | Admitting: Family Medicine

## 2010-11-24 ENCOUNTER — Encounter: Payer: Self-pay | Admitting: Family Medicine

## 2010-11-25 ENCOUNTER — Encounter: Payer: Self-pay | Admitting: Family Medicine

## 2010-11-29 ENCOUNTER — Encounter: Payer: Self-pay | Admitting: Family Medicine

## 2010-11-29 ENCOUNTER — Other Ambulatory Visit: Payer: Self-pay | Admitting: Family Medicine

## 2010-11-29 ENCOUNTER — Encounter: Payer: BC Managed Care – PPO | Admitting: Family Medicine

## 2010-11-29 ENCOUNTER — Encounter: Payer: Self-pay | Admitting: *Deleted

## 2010-11-29 ENCOUNTER — Ambulatory Visit (INDEPENDENT_AMBULATORY_CARE_PROVIDER_SITE_OTHER): Payer: BC Managed Care – PPO | Admitting: Family Medicine

## 2010-11-29 VITALS — BP 130/88 | HR 68 | Resp 16 | Ht 63.0 in | Wt 338.4 lb

## 2010-11-29 DIAGNOSIS — J45909 Unspecified asthma, uncomplicated: Secondary | ICD-10-CM

## 2010-11-29 DIAGNOSIS — R7301 Impaired fasting glucose: Secondary | ICD-10-CM

## 2010-11-29 DIAGNOSIS — R3129 Other microscopic hematuria: Secondary | ICD-10-CM

## 2010-11-29 DIAGNOSIS — H547 Unspecified visual loss: Secondary | ICD-10-CM

## 2010-11-29 DIAGNOSIS — Z0000 Encounter for general adult medical examination without abnormal findings: Secondary | ICD-10-CM

## 2010-11-29 DIAGNOSIS — Z0289 Encounter for other administrative examinations: Secondary | ICD-10-CM

## 2010-11-29 DIAGNOSIS — I10 Essential (primary) hypertension: Secondary | ICD-10-CM

## 2010-11-29 DIAGNOSIS — E669 Obesity, unspecified: Secondary | ICD-10-CM

## 2010-11-29 LAB — POCT URINALYSIS DIPSTICK
Bilirubin, UA: NEGATIVE
Ketones, UA: NEGATIVE
Leukocytes, UA: NEGATIVE
Protein, UA: NEGATIVE
Spec Grav, UA: 1.02
pH, UA: 5.5

## 2010-11-29 MED ORDER — CLOTRIMAZOLE-BETAMETHASONE 1-0.05 % EX CREA: TOPICAL_CREAM | Freq: Two times a day (BID) | CUTANEOUS | Status: DC

## 2010-11-29 NOTE — Patient Instructions (Addendum)
F/u in 4 months.  It is important that you exercise regularly at least 30 minutes 5 times a week. If you develop chest pain, have severe difficulty breathing, or feel very tired, stop exercising immediately and seek medical attention    A healthy diet is rich in fruit, vegetables and whole grains. Poultry fish, nuts and beans are a healthy choice for protein rather then red meat. A low sodium diet and drinking 64 ounces of water daily is generally recommended. Oils and sweet should be limited. Carbohydrates especially for those who are diabetic or overweight, should be limited to 34-45 gram per meal. It is important to eat on a regular schedule, at least 3 times daily. Snacks should be primarily fruits, vegetables or nuts. Weight loss goal is 10 pounds   Mammogram is past due, we will schedule asap  Fasting labs asap  You need to get your eye exam , pls schedule an appt wit your eye doctor soon, since you have abnormal vision  It is important for you to take BP meds daily

## 2010-11-29 NOTE — Progress Notes (Signed)
  Subjective:    Patient ID: Brandy Dean, female    DOB: May 18, 1951, 60 y.o.   MRN: [id number]  HPI Pt in for physical exam required by her job. She refuses pelvic exam. She has no concerns, states hjer asthma is controlled, no recent flare, she is faithful with prescribed meds. Her weight is unchanged, she is not consistent in weight loss attempts Review of Systems Denies recent fever or chills. Denies sinus pressure, nasal congestion, ear pain or sore throat. Denies chest congestion, productive cough or wheezing. Denies chest pains, palpitations, paroxysmal nocturnal dyspnea, orthopnea and leg swelling Denies abdominal pain, nausea, vomiting,diarrhea or constipation.  Denies rectal bleeding or change in bowel movement. Denies dysuria, frequency, hesitancy or incontinence. Denies joint pain, swelling and limitation in mobility. Denies headaches, seizure, numbness, or tingling. Denies depression, anxiety or insomnia. Denies skin break down or rash.        Objective:   Physical Exam Patient alert and oriented and in no Cardiopulmonary distress.  HEENT: No facial asymmetry, EOMI, no sinus tenderness, TM's clear, Oropharynx pink and moist.  Neck supple no adenopathy.  Chest: Clear to auscultation bilaterally.  CVS: S1, S2 no murmurs, no S3.  ABD: Soft non tender. Bowel sounds normal.  Ext: No edema  MS: Adequate ROM spine, shoulders, hips and knees.  Skin: Intact, no ulcerations or rash noted.  Psych: Good eye contact, normal affect. Memory intact not anxious or depressed appearing.  CNS: CN 2-12 intact, power, tone and sensation normal throughout.        Assessment & Plan:

## 2010-12-02 LAB — URINE CULTURE

## 2010-12-08 NOTE — Assessment & Plan Note (Signed)
Controlled, no change in medication  

## 2010-12-08 NOTE — Assessment & Plan Note (Signed)
Reports "floaters" advised to follow with opthalmology

## 2010-12-08 NOTE — Assessment & Plan Note (Signed)
Deteriorated. Patient re-educated about  the importance of commitment to a  minimum of 150 minutes of exercise per week. The importance of healthy food choices with portion control discussed. Encouraged to start a food diary, count calories and to consider  joining a support group. Sample diet sheets offered. Goals set by the patient for the next several months.    

## 2011-01-17 ENCOUNTER — Telehealth: Payer: Self-pay | Admitting: Family Medicine

## 2011-01-17 NOTE — Telephone Encounter (Signed)
Ask pt to send in the refill request and ok 1 refill pls and let her know

## 2011-01-19 ENCOUNTER — Other Ambulatory Visit: Payer: Self-pay | Admitting: Family Medicine

## 2011-01-19 ENCOUNTER — Telehealth: Payer: Self-pay | Admitting: Family Medicine

## 2011-01-19 DIAGNOSIS — R05 Cough: Secondary | ICD-10-CM

## 2011-01-19 DIAGNOSIS — R059 Cough, unspecified: Secondary | ICD-10-CM

## 2011-01-19 MED ORDER — CHLORPHENIRAMINE-HYDROCODONE 8-10 MG/5ML PO LQCR: 5.0000 mL | Freq: Two times a day (BID) | ORAL | Status: DC | PRN

## 2011-01-19 NOTE — Telephone Encounter (Signed)
Noted and this is a duplicate message that has been sent to provider for review

## 2011-01-19 NOTE — Telephone Encounter (Signed)
States she is at different pharmacy and they do not have a record of it, states it has codeine in it

## 2011-01-19 NOTE — Telephone Encounter (Signed)
tussionex script printed, pls fax in after you spk with her

## 2011-01-20 ENCOUNTER — Telehealth: Payer: Self-pay | Admitting: Family Medicine

## 2011-01-20 MED ORDER — CHLORPHENIRAMINE-HYDROCODONE 8-10 MG/5ML PO LQCR: 5.0000 mL | Freq: Two times a day (BID) | ORAL | Status: DC | PRN

## 2011-01-20 NOTE — Telephone Encounter (Signed)
Med sent.

## 2011-01-20 NOTE — Telephone Encounter (Signed)
Patient aware it was sent in.

## 2011-03-04 ENCOUNTER — Other Ambulatory Visit: Payer: Self-pay | Admitting: Family Medicine

## 2011-03-18 NOTE — Telephone Encounter (Signed)
Error - delete msg.

## 2011-04-02 ENCOUNTER — Other Ambulatory Visit: Payer: Self-pay | Admitting: Family Medicine

## 2011-04-20 ENCOUNTER — Ambulatory Visit (HOSPITAL_COMMUNITY)
Admission: RE | Admit: 2011-04-20 | Discharge: 2011-04-20 | Disposition: A | Discharge status: Home / Self Care | Payer: BC Managed Care – PPO | Source: Ambulatory Visit | Attending: Family Medicine | Admitting: Family Medicine

## 2011-04-20 ENCOUNTER — Encounter: Payer: Self-pay | Admitting: Family Medicine

## 2011-04-20 ENCOUNTER — Ambulatory Visit (INDEPENDENT_AMBULATORY_CARE_PROVIDER_SITE_OTHER): Payer: BC Managed Care – PPO | Admitting: Family Medicine

## 2011-04-20 ENCOUNTER — Other Ambulatory Visit: Payer: Self-pay | Admitting: Family Medicine

## 2011-04-20 DIAGNOSIS — R0609 Other forms of dyspnea: Secondary | ICD-10-CM

## 2011-04-20 DIAGNOSIS — R05 Cough: Secondary | ICD-10-CM

## 2011-04-20 DIAGNOSIS — R5381 Other malaise: Secondary | ICD-10-CM

## 2011-04-20 DIAGNOSIS — R0602 Shortness of breath: Secondary | ICD-10-CM | POA: Insufficient documentation

## 2011-04-20 DIAGNOSIS — I10 Essential (primary) hypertension: Secondary | ICD-10-CM

## 2011-04-20 DIAGNOSIS — R5383 Other fatigue: Secondary | ICD-10-CM

## 2011-04-20 DIAGNOSIS — Z79899 Other long term (current) drug therapy: Secondary | ICD-10-CM

## 2011-04-20 DIAGNOSIS — R059 Cough, unspecified: Secondary | ICD-10-CM

## 2011-04-20 DIAGNOSIS — Z139 Encounter for screening, unspecified: Secondary | ICD-10-CM

## 2011-04-20 DIAGNOSIS — R7301 Impaired fasting glucose: Secondary | ICD-10-CM

## 2011-04-20 DIAGNOSIS — E785 Hyperlipidemia, unspecified: Secondary | ICD-10-CM

## 2011-04-20 DIAGNOSIS — J45909 Unspecified asthma, uncomplicated: Secondary | ICD-10-CM

## 2011-04-20 MED ORDER — IPRATROPIUM BROMIDE 0.02 % IN SOLN
0.5000 mg | Freq: Once | RESPIRATORY_TRACT | Status: AC
  Administered 2011-04-20: 0.5 mg via RESPIRATORY_TRACT

## 2011-04-20 MED ORDER — ALBUTEROL SULFATE (2.5 MG/3ML) 0.083% IN NEBU
2.5000 mg | INHALATION_SOLUTION | Freq: Once | RESPIRATORY_TRACT | Status: AC
  Administered 2011-04-20: 2.5 mg via RESPIRATORY_TRACT

## 2011-04-20 NOTE — Progress Notes (Signed)
  Subjective:    Patient ID: Brandy Dean, female    DOB: 08-04-1950, 60 y.o.   MRN: [id number]  HPI Pt reports being sick x 1 month. Hoarse, shortness of breath with cough and increased wheezing, denies fever or chills or productive cough. Reports increase exertional fatigue. Occasional chest discomfort for the past month, just "doesn't feel well"Denie PND, orhtopnea or leg swelling   Review of Systems See HPI Denies recent fever or chills. Denies sinus pressure, nasal congestion, ear pain or sore throat. Denies abdominal pain, nausea, vomiting,diarrhea or constipation.   Denies dysuria, frequency, hesitancy or incontinence. Denies joint pain, swelling and limitation in mobility. Denies headaches, seizures, numbness, or tingling. Denies depression, anxiety or insomnia. Denies skin break down or rash.        Objective:   Physical Exam Patient alert and oriented and in mild  cardiopulmonary distress.  HEENT: No facial asymmetry, EOMI, no sinus tenderness,  oropharynx pink and moist.  Neck supple no adenopathy.  Chest: decreased air entry bilaterally with wheezing  CVS: S1, S2 no murmurs, no S3.  ABD: Soft non tender. Bowel sounds normal.  Ext: No edema  MS: Adequate ROM spine, shoulders, hips and knees.  Skin: Intact, no ulcerations or rash noted.  Psych: Good eye contact, normal affect. Memory intact not anxious or depressed appearing.  CNS: CN 2-12 intact, power, tone and sensation normal throughout.        Assessment & Plan:

## 2011-04-20 NOTE — Patient Instructions (Addendum)
F/u in 2 months.  You need a CXR asap, it is ordered  Fasting labs today .  You need to order a mammogram you have not had one in years   Congrats on weight loss keep it up  The numbness in your hand is likely due to carpal tunnel syndrome, nerve conduction tests can be ordered to further evaluate

## 2011-04-20 NOTE — Assessment & Plan Note (Signed)
1 month h/o exertinal dypnea, will refer for card eval

## 2011-04-21 LAB — BASIC METABOLIC PANEL
Chloride: 100 mEq/L (ref 96–112)
Potassium: 4.1 mEq/L (ref 3.5–5.3)
Sodium: 141 mEq/L (ref 135–145)

## 2011-04-21 LAB — CBC WITH DIFFERENTIAL/PLATELET
Basophils Absolute: 0 10*3/uL (ref 0.0–0.1)
Basophils Relative: 0 % (ref 0–1)
HCT: 44.1 % (ref 36.0–46.0)
Hemoglobin: 14.1 g/dL (ref 12.0–15.0)
Lymphocytes Relative: 34 % (ref 12–46)
MCHC: 32 g/dL (ref 30.0–36.0)
Monocytes Absolute: 0.5 10*3/uL (ref 0.1–1.0)
Neutro Abs: 4.6 10*3/uL (ref 1.7–7.7)
Neutrophils Relative %: 57 % (ref 43–77)
RDW: 14.8 % (ref 11.5–15.5)
WBC: 7.9 10*3/uL (ref 4.0–10.5)

## 2011-04-21 LAB — HEPATIC FUNCTION PANEL
AST: 15 U/L (ref 0–37)
Alkaline Phosphatase: 57 U/L (ref 39–117)
Bilirubin, Direct: 0.1 mg/dL (ref 0.0–0.3)
Indirect Bilirubin: 0.6 mg/dL (ref 0.0–0.9)
Total Bilirubin: 0.7 mg/dL (ref 0.3–1.2)

## 2011-04-21 LAB — LIPID PANEL
HDL: 58 mg/dL (ref >39)
LDL Cholesterol: 164 mg/dL — ABNORMAL HIGH (ref 0–99)
Total CHOL/HDL Ratio: 4 Ratio
VLDL: 12 mg/dL (ref 0–40)

## 2011-04-21 LAB — HEMOGLOBIN A1C
Hgb A1c MFr Bld: 6 % — ABNORMAL HIGH (ref <5.7)
Mean Plasma Glucose: 126 mg/dL — ABNORMAL HIGH (ref <117)

## 2011-04-21 LAB — TSH: TSH: 1.106 u[IU]/mL (ref 0.350–4.500)

## 2011-04-24 DIAGNOSIS — E785 Hyperlipidemia, unspecified: Secondary | ICD-10-CM | POA: Insufficient documentation

## 2011-04-24 DIAGNOSIS — E782 Mixed hyperlipidemia: Secondary | ICD-10-CM | POA: Insufficient documentation

## 2011-04-24 MED ORDER — METFORMIN HCL 500 MG PO TABS: 500.0000 mg | ORAL_TABLET | Freq: Every day | ORAL | Status: DC

## 2011-04-24 MED ORDER — PRAVASTATIN SODIUM 40 MG PO TABS: 40.0000 mg | ORAL_TABLET | Freq: Every day | ORAL | Status: DC

## 2011-04-24 NOTE — Assessment & Plan Note (Signed)
Controlled, no change in medication  

## 2011-04-24 NOTE — Assessment & Plan Note (Signed)
Elevated hba1c, start she is aware and agrees, will alos follow a low carb diet after attending class

## 2011-04-24 NOTE — Assessment & Plan Note (Signed)
Uncontrolled neb treatment administered at OV

## 2011-04-24 NOTE — Assessment & Plan Note (Signed)
Elevated LDL multiple cardiac risk factors low fat diet information given, pt also to start medication

## 2011-04-25 ENCOUNTER — Ambulatory Visit: Payer: BC Managed Care – PPO | Admitting: Family Medicine

## 2011-04-25 MED ORDER — ALBUTEROL SULFATE HFA 108 (90 BASE) MCG/ACT IN AERS: 2.0000 | INHALATION_SPRAY | Freq: Four times a day (QID) | RESPIRATORY_TRACT | Status: DC | PRN

## 2011-04-25 NOTE — Progress Notes (Signed)
Addended by: Abner Greenspan on: 04/25/2011 10:48 AM   Modules accepted: Orders, Medications

## 2011-04-26 ENCOUNTER — Other Ambulatory Visit: Payer: Self-pay | Admitting: Family Medicine

## 2011-04-26 MED ORDER — IPRATROPIUM BROMIDE 0.02 % IN SOLN: 500.0000 ug | Freq: Four times a day (QID) | RESPIRATORY_TRACT | Status: DC

## 2011-04-28 ENCOUNTER — Encounter: Payer: Self-pay | Admitting: Family Medicine

## 2011-04-29 ENCOUNTER — Ambulatory Visit: Payer: BC Managed Care – PPO | Admitting: Cardiology

## 2011-05-02 ENCOUNTER — Ambulatory Visit (HOSPITAL_COMMUNITY)
Admission: RE | Admit: 2011-05-02 | Discharge: 2011-05-02 | Disposition: A | Discharge status: Home / Self Care | Payer: BC Managed Care – PPO | Source: Ambulatory Visit | Attending: Family Medicine | Admitting: Family Medicine

## 2011-05-02 DIAGNOSIS — Z139 Encounter for screening, unspecified: Secondary | ICD-10-CM

## 2011-05-02 DIAGNOSIS — Z1231 Encounter for screening mammogram for malignant neoplasm of breast: Secondary | ICD-10-CM | POA: Insufficient documentation

## 2011-05-10 ENCOUNTER — Encounter: Payer: Self-pay | Admitting: Cardiology

## 2011-05-11 ENCOUNTER — Encounter: Payer: Self-pay | Admitting: *Deleted

## 2011-05-11 ENCOUNTER — Ambulatory Visit (INDEPENDENT_AMBULATORY_CARE_PROVIDER_SITE_OTHER): Payer: BC Managed Care – PPO | Admitting: Cardiology

## 2011-05-11 ENCOUNTER — Encounter: Payer: Self-pay | Admitting: Cardiology

## 2011-05-11 DIAGNOSIS — R011 Cardiac murmur, unspecified: Secondary | ICD-10-CM

## 2011-05-11 DIAGNOSIS — R079 Chest pain, unspecified: Secondary | ICD-10-CM

## 2011-05-11 DIAGNOSIS — R0602 Shortness of breath: Secondary | ICD-10-CM

## 2011-05-11 DIAGNOSIS — R7301 Impaired fasting glucose: Secondary | ICD-10-CM

## 2011-05-11 NOTE — Assessment & Plan Note (Signed)
Largely atypical in description.

## 2011-05-11 NOTE — Progress Notes (Signed)
Clinical Summary Brandy Dean is a 60 y.o.female referred for cardiology consultation by Dr. Lodema Hong with recent symptoms of chest pain and shortness of breath. She reports a history of asthma, relatively well-controlled on current regimen. She reports NYHA class 2-3 chronic dyspnea on exertion at baseline, also intermittent "tingling" in her chest that is largely sporadic. She states that the symptoms have been somewhat more prominent within the last few months. Mild sense of palpitations at times, but no dizziness or syncope.  She had recent lab work concerning for type 2 diabetes mellitus, also showing hyperlipidemia with LDL 164, HDL 58. Recent ECG is reviewed below.  She reports a long-term history of heart murmur since childhood, not clearly assessed by echocardiography based on record review. She has not undergone any recent ischemic evaluation.  She states that she would like to increase her activity and start working on her diet with a goal of weight loss.   Allergies  Allergen Reactions  . Benzonatate   . Ramipril     Medication list reviewed.  Past Medical History  Diagnosis Date  . Essential hypertension, benign   . Morbid obesity   . Bronchial asthma   . Sleep apnea   . Bacterial pneumonia     March  9-12,  2011    Past Surgical History  Procedure Date  . Cesarean section 1988 & 1991  . Right and left keloid ears 1968  . Partial left lobectomy 1968    Family History  Problem Relation Age of Onset  . Obesity Sister   . Esophageal cancer Father   . Stroke Mother   . Hypertension Mother     Social History Brandy Dean reports that she quit smoking about 3 years ago. Her smoking use included Cigarettes. She does not have any smokeless tobacco history on file. Brandy Dean reports that she does not drink alcohol.  Review of Systems No fevers or chills, no cough or hemoptysis. No orthopnea or PND. As outlined above, otherwise negative.  Physical Examination Filed Vitals:    05/11/11 1542  BP: 134/81  Pulse: 61   Morbidly obese woman in no acute distress. HEENT: Conjunctiva and lids normal, oropharynx clear. Neck: Supple, increased girth, no obvious elevated JVP or bruits. Lungs: Clear breath sounds, no active wheezing, nonlabored. Cardiac: Indistinct PMI, regular rate and rhythm, 2-3/6 systolic murmur heard best at the base. No S3. Abdomen: Nontender, bowel sounds present, no guarding. Extremities: No pitting edema, distal pulses 1-2+. Musculoskeletal: No kyphosis. Skin: Warm and dry. Neuropsychiatric: Alert and oriented x3, affect appropriate.  ECG Recent tracing from Dr. Anthony Sar evaluation shows sinus rhythm with mildly increased voltage, sinus arrhythmia.  Studies Chest x-ray 04/20/2011: Findings: The cardiac shadow is stable. Some mild linear atelectasis is again noted in the left mid lung. The lungs are otherwise well aerated without focal infiltrate. No sizable effusion is seen. Degenerative changes of the thoracic spine are noted.  IMPRESSION: No change from prior exam. No acute abnormality is noted.     Problem List and Plan

## 2011-05-11 NOTE — Assessment & Plan Note (Signed)
Seems to be relatively chronic, and has a history of asthma, although reportedly with good control of asthma on her present regimen. She has had intermittent atypical chest pain symptoms as well, somewhat more prominent within the last few months. Baseline ECG is nonspecific. Cardiac risk factors include morbid obesity, borderline type 2 diabetes mellitus, and hyperlipidemia with LDL of 164. She reports no definite premature cardiovascular disease in her family. She has had no ischemic workup as yet, and we will attempt an exercise echocardiogram for further evaluation.

## 2011-05-11 NOTE — Assessment & Plan Note (Signed)
Recently diagnosed, on medical therapy. Diet and weight loss also recommended.

## 2011-05-11 NOTE — Patient Instructions (Signed)
Your physician recommends that you schedule a follow-up appointment in: No follow up necessary unless echo is abnormal  Your physician has requested that you have an echocardiogram. Echocardiography is a painless test that uses sound waves to create images of your heart. It provides your doctor with information about the size and shape of your heart and how well your heart's chambers and valves are working. This procedure takes approximately one hour. There are no restrictions for this procedure.  Your physician has requested that you have a stress echocardiogram. For further information please visit [email or web address] Please follow instruction sheet as given.

## 2011-05-11 NOTE — Assessment & Plan Note (Signed)
Reportedly long-standing. Plan will be an echocardiogram for objective structural assessment, particularly in light of her symptoms.

## 2011-05-12 ENCOUNTER — Encounter: Payer: Self-pay | Admitting: Cardiology

## 2011-05-24 ENCOUNTER — Other Ambulatory Visit (HOSPITAL_COMMUNITY): Payer: BC Managed Care – PPO

## 2011-05-24 ENCOUNTER — Ambulatory Visit (HOSPITAL_COMMUNITY)
Admission: RE | Admit: 2011-05-24 | Discharge: 2011-05-24 | Disposition: A | Discharge status: Home / Self Care | Payer: BC Managed Care – PPO | Source: Ambulatory Visit | Attending: Cardiology | Admitting: Cardiology

## 2011-05-24 ENCOUNTER — Encounter (HOSPITAL_COMMUNITY): Payer: Self-pay | Admitting: Cardiology

## 2011-05-24 DIAGNOSIS — I369 Nonrheumatic tricuspid valve disorder, unspecified: Secondary | ICD-10-CM

## 2011-05-24 DIAGNOSIS — R0602 Shortness of breath: Secondary | ICD-10-CM

## 2011-05-24 DIAGNOSIS — I10 Essential (primary) hypertension: Secondary | ICD-10-CM | POA: Insufficient documentation

## 2011-05-24 DIAGNOSIS — G4733 Obstructive sleep apnea (adult) (pediatric): Secondary | ICD-10-CM | POA: Insufficient documentation

## 2011-05-24 DIAGNOSIS — R011 Cardiac murmur, unspecified: Secondary | ICD-10-CM | POA: Insufficient documentation

## 2011-05-24 DIAGNOSIS — R0989 Other specified symptoms and signs involving the circulatory and respiratory systems: Secondary | ICD-10-CM | POA: Insufficient documentation

## 2011-05-24 DIAGNOSIS — R079 Chest pain, unspecified: Secondary | ICD-10-CM

## 2011-05-24 DIAGNOSIS — E785 Hyperlipidemia, unspecified: Secondary | ICD-10-CM | POA: Insufficient documentation

## 2011-05-24 DIAGNOSIS — R0609 Other forms of dyspnea: Secondary | ICD-10-CM | POA: Insufficient documentation

## 2011-05-24 DIAGNOSIS — R072 Precordial pain: Secondary | ICD-10-CM

## 2011-05-24 NOTE — Progress Notes (Signed)
*  PRELIMINARY RESULTS* Echocardiogram 2D Echocardiogram has been performed.  Brandy Dean 05/24/2011, 9:23 AM

## 2011-05-24 NOTE — Progress Notes (Signed)
*  PRELIMINARY RESULTS* Echocardiogram Echocardiogram Stress Test has been performed.  Conrad Williamsport 05/24/2011, 11:20 AM

## 2011-05-24 NOTE — Progress Notes (Signed)
Stress Lab Nurses Notes - [facility]  BRIGHTYN MOZER 05/24/2011  Reason for doing test: Chest Pain and Dyspnea Type of test: Stress Echo Nurse performing test: Parke Poisson, RN Nuclear Medicine Tech: Not Applicable Echo Tech: Karrie Doffing MD performing test: Ival Bible & Joni Reining NP Family MD: Lodema Hong Test explained and consent signed: yes IV started: No IV started Symptoms: SOB , Wheezing  (patients inhaler used)  & coughing during recovery. Treatment/Intervention: None Reason test stopped: fatigue and SOB After recovery IV was: NA Patient to return to Nuc. Med at :NA Patient discharged: Home Patient's Condition upon discharge was: stable Comments: During test peak BP 192/88 & HR 116.  Recovery BP 148/82 &HR 70.  Symptoms resolved in recovery. Erskine Speed T

## 2011-06-25 ENCOUNTER — Other Ambulatory Visit: Payer: Self-pay | Admitting: Family Medicine

## 2011-07-01 ENCOUNTER — Encounter: Payer: Self-pay | Admitting: Family Medicine

## 2011-07-05 ENCOUNTER — Encounter: Payer: Self-pay | Admitting: Family Medicine

## 2011-07-05 ENCOUNTER — Ambulatory Visit (INDEPENDENT_AMBULATORY_CARE_PROVIDER_SITE_OTHER): Payer: BC Managed Care – PPO | Admitting: Family Medicine

## 2011-07-05 VITALS — BP 130/76 | HR 58 | Resp 16 | Ht 63.0 in | Wt 323.0 lb

## 2011-07-05 DIAGNOSIS — E669 Obesity, unspecified: Secondary | ICD-10-CM

## 2011-07-05 DIAGNOSIS — E785 Hyperlipidemia, unspecified: Secondary | ICD-10-CM

## 2011-07-05 DIAGNOSIS — J45909 Unspecified asthma, uncomplicated: Secondary | ICD-10-CM

## 2011-07-05 DIAGNOSIS — I10 Essential (primary) hypertension: Secondary | ICD-10-CM

## 2011-07-05 DIAGNOSIS — R7301 Impaired fasting glucose: Secondary | ICD-10-CM

## 2011-07-05 NOTE — Assessment & Plan Note (Signed)
Improved with symbiicort

## 2011-07-05 NOTE — Patient Instructions (Signed)
F/u in 4 month.  Fasting lipid, chem 7 , hepatic and HBA1C feb 7 or after   Fasting lipid, cmp and HBa1C in 4 months, before next visit.  It is important that you exercise regularly at least 30 minutes 5 times a week. If you develop chest pain, have severe difficulty breathing, or feel very tired, stop exercising immediately and seek medical attention    A healthy diet is rich in fruit, vegetables and whole grains. Poultry fish, nuts and beans are a healthy choice for protein rather then red meat. A low sodium diet and drinking 64 ounces of water daily is generally recommended. Oils and sweet should be limited. Carbohydrates especially for those who are diabetic or overweight, should be limited to 34-45 gram per meal. It is important to eat on a regular schedule, at least 3 times daily. Snacks should be primarily fruits, vegetables or nuts.   PLEASE LOG INTO "my fitness pal" this takes he guesswork out of calorie control

## 2011-07-05 NOTE — Assessment & Plan Note (Signed)
Controlled, no change in medication  

## 2011-07-05 NOTE — Assessment & Plan Note (Signed)
Deteriorated. Patient re-educated about  the importance of commitment to a  minimum of 150 minutes of exercise per week. The importance of healthy food choices with portion control discussed. Encouraged to start a food diary, count calories and to consider  joining a support group. Sample diet sheets offered. Goals set by the patient for the next several months.    

## 2011-07-06 ENCOUNTER — Other Ambulatory Visit: Payer: Self-pay | Admitting: Family Medicine

## 2011-07-09 NOTE — Assessment & Plan Note (Signed)
The importance of weight loss and limitation of carbs is discussed to reduce the risk of developing diabetes

## 2011-07-09 NOTE — Assessment & Plan Note (Signed)
Uncontrolled, low fat diet discussed and encouraged, also pt to continue prescription meds, will recheck level at next visit

## 2011-07-09 NOTE — Progress Notes (Signed)
  Subjective:    Patient ID: Brandy Dean, female    DOB: 28-Jun-1950, 61 y.o.   MRN: [id number]  HPI The PT is here for follow up and re-evaluation of chronic medical conditions, medication management and review of any available recent lab and radiology data.  Preventive health is updated, specifically  Cancer screening and Immunization.   Questions or concerns regarding consultations or procedures which the PT has had in the interim are  addressed. The PT denies any adverse reactions to current medications since the last visit.  There are no new concerns.  There are no specific complaints  She is motivated to taking better care of her health and is embarking on weight loss through dietary change and increased activity      Review of Systems See HPI Denies recent fever or chills. Denies sinus pressure, nasal congestion, ear pain or sore throat. Denies chest congestion, productive cough or wheezing. Denies chest pains, palpitations and leg swelling Denies abdominal pain, nausea, vomiting,diarrhea or constipation.   Denies dysuria, frequency, hesitancy or incontinence. Denies joint pain, swelling and limitation in mobility. Denies headaches, seizures, numbness, or tingling. Denies depression, anxiety or insomnia. Denies skin break down or rash.        Objective:   Physical Exam Patient alert and oriented and in no cardiopulmonary distress.  HEENT: No facial asymmetry, EOMI, no sinus tenderness,  oropharynx pink and moist.  Neck supple no adenopathy.  Chest: Clear to auscultation bilaterally.  CVS: S1, S2 no murmurs, no S3.  ABD: Soft non tender. Bowel sounds normal.  Ext: No edema  MS:  Though reduced ROM spine, shoulders, hips and knees.  Skin: Intact, no ulcerations or rash noted.  Psych: Good eye contact, normal affect. Memory intact not anxious or depressed appearing.  CNS: CN 2-12 intact, power, tone and sensation normal throughout.        Assessment &  Plan:

## 2011-07-12 ENCOUNTER — Other Ambulatory Visit: Payer: Self-pay

## 2011-07-12 MED ORDER — BUDESONIDE-FORMOTEROL FUMARATE 80-4.5 MCG/ACT IN AERO: INHALATION_SPRAY | RESPIRATORY_TRACT | Status: DC

## 2011-07-12 MED ORDER — TRIAMTERENE-HCTZ 37.5-25 MG PO TABS: ORAL_TABLET | ORAL | Status: DC

## 2011-08-31 ENCOUNTER — Emergency Department (HOSPITAL_COMMUNITY): Payer: BC Managed Care – PPO

## 2011-08-31 ENCOUNTER — Emergency Department (HOSPITAL_COMMUNITY)
Admission: EM | Admit: 2011-08-31 | Discharge: 2011-08-31 | Disposition: A | Discharge status: Home / Self Care | Payer: BC Managed Care – PPO | Attending: Emergency Medicine | Admitting: Emergency Medicine

## 2011-08-31 ENCOUNTER — Encounter (HOSPITAL_COMMUNITY): Payer: Self-pay

## 2011-08-31 DIAGNOSIS — R509 Fever, unspecified: Secondary | ICD-10-CM | POA: Insufficient documentation

## 2011-08-31 DIAGNOSIS — R630 Anorexia: Secondary | ICD-10-CM | POA: Insufficient documentation

## 2011-08-31 DIAGNOSIS — J189 Pneumonia, unspecified organism: Secondary | ICD-10-CM

## 2011-08-31 DIAGNOSIS — R0989 Other specified symptoms and signs involving the circulatory and respiratory systems: Secondary | ICD-10-CM | POA: Insufficient documentation

## 2011-08-31 DIAGNOSIS — R05 Cough: Secondary | ICD-10-CM | POA: Insufficient documentation

## 2011-08-31 DIAGNOSIS — R059 Cough, unspecified: Secondary | ICD-10-CM | POA: Insufficient documentation

## 2011-08-31 DIAGNOSIS — I10 Essential (primary) hypertension: Secondary | ICD-10-CM | POA: Insufficient documentation

## 2011-08-31 DIAGNOSIS — Z79899 Other long term (current) drug therapy: Secondary | ICD-10-CM | POA: Insufficient documentation

## 2011-08-31 MED ORDER — IPRATROPIUM BROMIDE 0.02 % IN SOLN
0.5000 mg | Freq: Once | RESPIRATORY_TRACT | Status: AC
Start: 2011-08-31 — End: 2011-08-31
  Administered 2011-08-31: 0.5 mg via RESPIRATORY_TRACT
  Filled 2011-08-31: qty 2.5

## 2011-08-31 MED ORDER — ALBUTEROL SULFATE (5 MG/ML) 0.5% IN NEBU
5.0000 mg | INHALATION_SOLUTION | Freq: Once | RESPIRATORY_TRACT | Status: AC
  Administered 2011-08-31: 5 mg via RESPIRATORY_TRACT
  Filled 2011-08-31: qty 1

## 2011-08-31 MED ORDER — LEVOFLOXACIN 500 MG PO TABS: 500.0000 mg | ORAL_TABLET | Freq: Every day | ORAL | Status: AC

## 2011-08-31 MED ORDER — LEVOFLOXACIN 500 MG PO TABS
750.0000 mg | ORAL_TABLET | Freq: Once | ORAL | Status: AC
  Administered 2011-08-31: 750 mg via ORAL
  Filled 2011-08-31: qty 4
  Filled 2011-08-31: qty 1

## 2011-08-31 NOTE — ED Notes (Signed)
Pt c/o cough with chest congestion for 2 days with fevers.

## 2011-08-31 NOTE — ED Provider Notes (Signed)
History   This chart was scribed for Brandy Melter, MD by Sofie Rower. The patient was seen in room APA06/APA06 and the patient's care was started at 10:45PM.    CSN: [id number]  Arrival date & time 08/31/11  2019   None     Chief Complaint  Patient presents with  . Cough    (Consider location/radiation/quality/duration/timing/severity/associated sxs/prior treatment) HPI  Brandy Dean is a 61 y.o. female who presents to the Emergency Department complaining of moderate, constant cough onset two days ago with associated symptoms of fever, decreased appetite. Pt states "her chest is full of congestion". Modifying factors include albuterol, symbicort which moderately relieve the pain.  Pt denies smoking,   PCP is Dr. Lodema Hong.  Past Medical History  Diagnosis Date  . Essential hypertension, benign   . Morbid obesity   . Bronchial asthma   . Sleep apnea   . Bacterial pneumonia     March  9-12,  2011    Past Surgical History  Procedure Date  . Cesarean section 1988 & 1991  . Right and left keloid ears 1968  . Partial left lobectomy 1968    Family History  Problem Relation Age of Onset  . Obesity Sister   . Esophageal cancer Father   . Stroke Mother   . Hypertension Mother     History  Substance Use Topics  . Smoking status: Former Smoker    Types: Cigarettes    Quit date: 06/14/2007  . Smokeless tobacco: Not on file  . Alcohol Use: No    OB History    Grav Para Term Preterm Abortions TAB SAB Ect Mult Living                  Review of Systems  All other systems reviewed and are negative.    10 Systems reviewed and are negative for acute change except as noted in the HPI.   Allergies  Benzonatate and Ramipril  Home Medications   Current Outpatient Rx  Name Route Sig Dispense Refill  . ALBUTEROL SULFATE HFA 108 (90 BASE) MCG/ACT IN AERS Inhalation Inhale 2 puffs into the lungs every 6 (six) hours as needed. 1 each 3  . BUDESONIDE-FORMOTEROL  FUMARATE 80-4.5 MCG/ACT IN AERO  INHALE TWO PUFFS BY MOUTH TWICE DAILY 10.2 g 4  . CHLORPHENIRAMINE-HYDROCODONE 8-10 MG/5ML PO LQCR Oral Take 5 mLs by mouth every 12 (twelve) hours as needed for cough. 300 mL 0  . IPRATROPIUM BROMIDE 0.02 % IN SOLN Nebulization Take 2.5 mLs (500 mcg total) by nebulization 4 (four) times daily. 75 mL 12  . TRIAMTERENE-HCTZ 37.5-25 MG PO TABS  TAKE ONE TABLET BY MOUTH EVERY DAY 30 tablet 4  . LEVOFLOXACIN 500 MG PO TABS Oral Take 1 tablet (500 mg total) by mouth daily. 10 tablet 0    BP 130/78  Pulse 89  Temp(Src) 99.8 F (37.7 C) (Oral)  Resp 22  Ht 5\' 4"  (1.626 m)  Wt 320 lb (145.151 kg)  BMI 54.93 kg/m2  SpO2 95%  Physical Exam  Nursing note and vitals reviewed. Constitutional: She is oriented to person, place, and time. She appears well-developed and well-nourished.  HENT:  Head: Normocephalic and atraumatic.  Right Ear: External ear normal.  Left Ear: External ear normal.  Nose: Nose normal.  Mouth/Throat: Oropharynx is clear and moist.  Eyes: Conjunctivae and EOM are normal. Pupils are equal, round, and reactive to light. No scleral icterus.  Neck: Normal range of motion and phonation  normal. Neck supple.  Cardiovascular: Normal rate, regular rhythm and intact distal pulses.   Pulmonary/Chest: Effort normal. She has rales (bilaterally). She exhibits no tenderness.       Decreased air movement.   Abdominal: Soft. She exhibits no distension. There is no tenderness. There is no guarding.  Musculoskeletal: Normal range of motion.  Neurological: She is alert and oriented to person, place, and time. She has normal strength. She exhibits normal muscle tone.  Skin: Skin is warm and dry.  Psychiatric: She has a normal mood and affect. Her behavior is normal. Judgment and thought content normal.    ED Course  Procedures (including critical care time)  DIAGNOSTIC STUDIES: Oxygen Saturation is 95% on room air, adequate by my interpretation.     COORDINATION OF CARE:     Labs Reviewed - No data to display Dg Chest 2 View  08/31/2011  *RADIOLOGY REPORT*  Clinical Data: Cough.  Chest congestion.  Fever.  CHEST - 2 VIEW  Comparison: 04/19/2012  Findings: There are patchy infiltrates in the left lower lobe.  No dense consolidation, collapse or effusion.  Heart size is normal. The aorta is unfolded.  No significant bony finding.  IMPRESSION: Patchy left lower lobe bronchopneumonia.  Original Report Authenticated By: Thomasenia Sales, M.D.     1. Community acquired pneumonia     10:46PM- EDP at bedside discusses treatment plan.   MDM  Cough, with pneumonia, normal vital signs. She is stable for discharge with outpatient management. Suspect community-acquired bacterial pneumonia.     I personally performed the services described in this documentation, which was scribed in my presence. The recorded information has been reviewed and considered.     Brandy Melter, MD 08/31/11 [phone]

## 2011-08-31 NOTE — Discharge Instructions (Signed)
Use her nebulizer every 3-4 hours as needed for cough or trouble breathing. Take Tylenol for fever. See your Dr. if not better in 2-3 days.   Pneumonia, Adult Pneumonia is an infection of the lungs. It may be caused by a germ (virus or bacteria). Some types of pneumonia can spread easily from person to person. This can happen when you cough or sneeze. HOME CARE  Only take medicine as told by your doctor.   Take your medicine (antibiotics) as told. Finish it even if you start to feel better.   Do not smoke.   You may use a vaporizer or humidifier in your room. This can help loosen thick spit (mucus).   Sleep so you are almost sitting up (semi-upright). This helps reduce coughing.   Rest.  A shot (vaccine) can help prevent pneumonia. Shots are often advised for:  People over 27 years old.   Patients on chemotherapy.   People with long-term (chronic) lung problems.   People with immune system problems.  GET HELP RIGHT AWAY IF:   You are getting worse.   You cannot control your cough, and you are losing sleep.   You cough up blood.   Your pain gets worse, even with medicine.   You have a fever.   Any of your problems are getting worse, not better.   You have shortness of breath or chest pain.  MAKE SURE YOU:   Understand these instructions.   Will watch your condition.   Will get help right away if you are not doing well or get worse.  Document Released: 11/16/2007 Document Revised: 05/19/2011 Document Reviewed: 08/20/2010 [facility] Patient Information 2012 [place], [place].

## 2011-10-11 ENCOUNTER — Other Ambulatory Visit: Payer: Self-pay | Admitting: Family Medicine

## 2011-10-25 ENCOUNTER — Encounter: Payer: Self-pay | Admitting: Family Medicine

## 2011-10-25 ENCOUNTER — Ambulatory Visit (INDEPENDENT_AMBULATORY_CARE_PROVIDER_SITE_OTHER): Payer: BC Managed Care – PPO | Admitting: Family Medicine

## 2011-10-25 VITALS — BP 128/74 | HR 68 | Resp 18 | Ht 63.0 in | Wt 316.0 lb

## 2011-10-25 DIAGNOSIS — R7303 Prediabetes: Secondary | ICD-10-CM

## 2011-10-25 DIAGNOSIS — R7309 Other abnormal glucose: Secondary | ICD-10-CM

## 2011-10-25 DIAGNOSIS — Z283 Underimmunization status: Secondary | ICD-10-CM

## 2011-10-25 DIAGNOSIS — R7301 Impaired fasting glucose: Secondary | ICD-10-CM

## 2011-10-25 DIAGNOSIS — J4540 Moderate persistent asthma, uncomplicated: Secondary | ICD-10-CM

## 2011-10-25 DIAGNOSIS — I10 Essential (primary) hypertension: Secondary | ICD-10-CM

## 2011-10-25 DIAGNOSIS — R5381 Other malaise: Secondary | ICD-10-CM

## 2011-10-25 DIAGNOSIS — E669 Obesity, unspecified: Secondary | ICD-10-CM

## 2011-10-25 DIAGNOSIS — E8881 Metabolic syndrome: Secondary | ICD-10-CM

## 2011-10-25 DIAGNOSIS — E785 Hyperlipidemia, unspecified: Secondary | ICD-10-CM

## 2011-10-25 DIAGNOSIS — J45909 Unspecified asthma, uncomplicated: Secondary | ICD-10-CM

## 2011-10-25 DIAGNOSIS — R5383 Other fatigue: Secondary | ICD-10-CM

## 2011-10-25 NOTE — Progress Notes (Signed)
  Subjective:    Patient ID: Brandy Dean, female    DOB: 1950/10/22, 61 y.o.   MRN: [id number]  HPI The PT is here for follow up and re-evaluation of chronic medical conditions, medication management and review of any available recent lab and radiology data.  Preventive health is updated, specifically  Cancer screening and Immunization.   Questions or concerns regarding consultations or procedures which the PT has had in the interim are  addressed. The PT denies any adverse reactions to current medications since the last visit.  There are no new concerns. Uses her rescue inhaler 1 to 2 times per week There are no specific complaints       Review of Systems See HPI Denies recent fever or chills. Denies sinus pressure, nasal congestion, ear pain or sore throat. Denies chest congestion, productive cough or wheezing. Denies chest pains, palpitations and leg swelling Denies abdominal pain, nausea, vomiting,diarrhea or constipation.   Denies dysuria, frequency, hesitancy or incontinence. Denies joint pain, swelling and limitation in mobility. Denies headaches, seizures, numbness, or tingling. Denies depression, anxiety or insomnia. Denies skin break down or rash.       Objective:   Physical Exam Patient alert and oriented and in no cardiopulmonary distress.  HEENT: No facial asymmetry, EOMI, no sinus tenderness,  oropharynx pink and moist.  Neck supple no adenopathy.  Chest: Clear to auscultation bilaterally.  CVS: S1, S2 no murmurs, no S3.  ABD: Soft non tender. Bowel sounds normal.  Ext: No edema  MS: Adequate ROM spine, shoulders, hips and knees.  Skin: Intact, no ulcerations or rash noted.  Psych: Good eye contact, normal affect. Memory intact not anxious or depressed appearing.  CNS: CN 2-12 intact, power, tone and sensation normal throughout.        Assessment & Plan:

## 2011-10-25 NOTE — Patient Instructions (Signed)
F/u in September  Fasting lipid, chem 7, HBA1C  In September  A healthy diet is rich in fruit, vegetables and whole grains. Poultry fish, nuts and beans are a healthy choice for protein rather then red meat. A low sodium diet and drinking 64 ounces of water daily is generally recommended. Oils and sweet should be limited. Carbohydrates especially for those who are diabetic or overweight, should be limited to 30-45 gram per meal. It is important to eat on a regular schedule, at least 3 times daily. Snacks should be primarily fruits, vegetables or nuts.  It is important that you exercise regularly at least 30 minutes 5 times a week. If you develop chest pain, have severe difficulty breathing, or feel very tired, stop exercising immediately and seek medical attention   You will be referred for a colonoscopy in July   You have lost 30 pounds in the last 2 years, congrats!

## 2011-11-01 MED ORDER — CLOTRIMAZOLE-BETAMETHASONE 1-0.05 % EX CREA: TOPICAL_CREAM | Freq: Two times a day (BID) | CUTANEOUS | Status: DC

## 2011-11-07 DIAGNOSIS — Z283 Underimmunization status: Secondary | ICD-10-CM | POA: Insufficient documentation

## 2011-11-07 DIAGNOSIS — E8881 Metabolic syndrome: Secondary | ICD-10-CM | POA: Insufficient documentation

## 2011-11-07 DIAGNOSIS — R7303 Prediabetes: Secondary | ICD-10-CM | POA: Insufficient documentation

## 2011-11-07 NOTE — Assessment & Plan Note (Signed)
Continues to refuse the TdAP and pneumonia vaccines which she needs, re education done

## 2011-11-07 NOTE — Assessment & Plan Note (Signed)
Controlled, no change in medication  

## 2011-11-07 NOTE — Assessment & Plan Note (Signed)
The importance of  Continued lifestyle change to improve health is stressed

## 2011-11-07 NOTE — Assessment & Plan Note (Signed)
Improved. Pt applauded on succesful weight loss through lifestyle change, and encouraged to continue same. Weight loss goal set for the next several months.  

## 2011-11-07 NOTE — Assessment & Plan Note (Signed)
Requires and uses daily medication, and still relies on fairly regular use of rescue mediocation

## 2011-11-07 NOTE — Assessment & Plan Note (Signed)
Hyperlipidemia:Low fat diet discussed and encouraged.  Updated labs needed, will encourage statin use due to multiple co-morbidities

## 2011-12-13 ENCOUNTER — Other Ambulatory Visit: Payer: Self-pay | Admitting: Family Medicine

## 2012-01-10 ENCOUNTER — Other Ambulatory Visit: Payer: Self-pay | Admitting: Family Medicine

## 2012-03-05 ENCOUNTER — Ambulatory Visit (INDEPENDENT_AMBULATORY_CARE_PROVIDER_SITE_OTHER): Payer: BC Managed Care – PPO | Admitting: Family Medicine

## 2012-03-05 ENCOUNTER — Encounter: Payer: Self-pay | Admitting: Family Medicine

## 2012-03-05 VITALS — BP 130/82 | HR 84 | Resp 18 | Ht 63.0 in | Wt 297.0 lb

## 2012-03-05 DIAGNOSIS — R7303 Prediabetes: Secondary | ICD-10-CM

## 2012-03-05 DIAGNOSIS — J4540 Moderate persistent asthma, uncomplicated: Secondary | ICD-10-CM

## 2012-03-05 DIAGNOSIS — E785 Hyperlipidemia, unspecified: Secondary | ICD-10-CM

## 2012-03-05 DIAGNOSIS — Z23 Encounter for immunization: Secondary | ICD-10-CM

## 2012-03-05 DIAGNOSIS — I10 Essential (primary) hypertension: Secondary | ICD-10-CM

## 2012-03-05 DIAGNOSIS — R7309 Other abnormal glucose: Secondary | ICD-10-CM

## 2012-03-05 DIAGNOSIS — J45909 Unspecified asthma, uncomplicated: Secondary | ICD-10-CM

## 2012-03-05 DIAGNOSIS — R7301 Impaired fasting glucose: Secondary | ICD-10-CM

## 2012-03-05 DIAGNOSIS — E669 Obesity, unspecified: Secondary | ICD-10-CM

## 2012-03-05 DIAGNOSIS — Z283 Underimmunization status: Secondary | ICD-10-CM

## 2012-03-05 NOTE — Progress Notes (Signed)
  Subjective:    Patient ID: Brandy Dean, female    DOB: 05-15-1951, 61 y.o.   MRN: [id number]  HPI The PT is here for follow up and re-evaluation of chronic medical conditions, medication management and review of any available recent lab and radiology data.  Preventive health is updated, specifically  Cancer screening and Immunization.   Questions or concerns regarding consultations or procedures which the PT has had in the interim are  addressed. The PT denies any adverse reactions to current medications since the last visit.  There are no new concerns. Has worked consistently and succesfully on lifestyle change, with excellent weight loss. She feels better There are no specific complaints Asthma well controlled with daily symbcort       Review of Systems See HPI Denies recent fever or chills. Denies sinus pressure, nasal congestion, ear pain or sore throat. Denies chest congestion, productive cough or wheezing. Denies chest pains, palpitations and leg swelling Denies abdominal pain, nausea, vomiting,diarrhea or constipation.   Denies dysuria, frequency, hesitancy or incontinence. Denies joint pain, swelling and limitation in mobility. Denies headaches, seizures, numbness, or tingling. Denies depression, anxiety or insomnia. Denies skin break down or rash.        Objective:   Physical Exam Patient alert and oriented and in no cardiopulmonary distress.  HEENT: No facial asymmetry, EOMI, no sinus tenderness,  oropharynx pink and moist.  Neck supple no adenopathy.  Chest: Clear to auscultation bilaterally.  CVS: S1, S2 no murmurs, no S3.  ABD: Soft non tender. Bowel sounds normal.  Ext: No edema  MS: Adequate ROM spine, shoulders, hips and knees.  Skin: Intact, no ulcerations or rash noted.  Psych: Good eye contact, normal affect. Memory intact not anxious or depressed appearing.  CNS: CN 2-12 intact, power, tone and sensation normal throughout.          Assessment & Plan:

## 2012-03-05 NOTE — Patient Instructions (Addendum)
F/u in 4.5 monTH  TDAP and flu vaccine  Today..congrats, with one tylenol  Fasting lipid, cmp , hBA1C  Past due please get this week  CONGRATS on excellent weight loss keep it up!  Mammogram due in Nov please sched

## 2012-03-18 NOTE — Assessment & Plan Note (Signed)
Controlled, no change in medication DASH diet and commitment to daily physical activity for a minimum of 30 minutes discussed and encouraged, as a part of hypertension management. The importance of attaining a healthy weight is also discussed.  

## 2012-03-18 NOTE — Assessment & Plan Note (Signed)
Patient educated about the importance of limiting  Carbohydrate intake , the need to commit to daily physical activity for a minimum of 30 minutes , and to commit weight loss. The fact that changes in all these areas will reduce or eliminate all together the development of diabetes is stressed.   Updated lab needed 

## 2012-03-18 NOTE — Assessment & Plan Note (Signed)
Improved symptom control with daily symbicort

## 2012-03-18 NOTE — Assessment & Plan Note (Signed)
Uncontrolled, pt refusing med at Brandy Dean time, dietary modification only. With other co morbidites, it i imperative this improves. Updated labs needed

## 2012-03-18 NOTE — Assessment & Plan Note (Signed)
Refusing pneumovac today

## 2012-03-18 NOTE — Assessment & Plan Note (Signed)
Improved. Pt applauded on succesful weight loss through lifestyle change, and encouraged to continue same. Weight loss goal set for the next several months.  

## 2012-07-07 ENCOUNTER — Other Ambulatory Visit: Payer: Self-pay | Admitting: Family Medicine

## 2012-07-09 ENCOUNTER — Other Ambulatory Visit: Payer: Self-pay | Admitting: Family Medicine

## 2012-07-23 ENCOUNTER — Ambulatory Visit: Payer: BC Managed Care – PPO | Admitting: Family Medicine

## 2012-07-24 ENCOUNTER — Encounter: Payer: Self-pay | Admitting: Family Medicine

## 2012-08-09 ENCOUNTER — Ambulatory Visit (INDEPENDENT_AMBULATORY_CARE_PROVIDER_SITE_OTHER): Payer: BC Managed Care – PPO | Admitting: Family Medicine

## 2012-08-09 VITALS — BP 142/84 | HR 80 | Resp 16 | Wt 301.0 lb

## 2012-08-09 DIAGNOSIS — Z2911 Encounter for prophylactic immunotherapy for respiratory syncytial virus (RSV): Secondary | ICD-10-CM

## 2012-08-09 DIAGNOSIS — R7303 Prediabetes: Secondary | ICD-10-CM

## 2012-08-09 DIAGNOSIS — R7309 Other abnormal glucose: Secondary | ICD-10-CM

## 2012-08-09 DIAGNOSIS — E669 Obesity, unspecified: Secondary | ICD-10-CM

## 2012-08-09 DIAGNOSIS — J4540 Moderate persistent asthma, uncomplicated: Secondary | ICD-10-CM

## 2012-08-09 DIAGNOSIS — B369 Superficial mycosis, unspecified: Secondary | ICD-10-CM

## 2012-08-09 DIAGNOSIS — I10 Essential (primary) hypertension: Secondary | ICD-10-CM

## 2012-08-09 NOTE — Progress Notes (Signed)
  Subjective:    Patient ID: Brandy Dean, female    DOB: 10-06-1950, 62 y.o.   MRN: [id number]  HPI The PT is here for follow up and re-evaluation of chronic medical conditions, medication management and review of any available recent lab and radiology data.  Preventive health is updated, specifically  Cancer screening and Immunization.   Questions or concerns regarding consultations or procedures which the PT has had in the interim are  addressed. The PT denies any adverse reactions to current medications since the last visit.  There are no new concerns.  There are no specific complaints       Review of Systems See HPI Denies recent fever or chills. Denies sinus pressure, nasal congestion, ear pain or sore throat. Denies chest congestion, productive cough or wheezing. Denies chest pains, palpitations and leg swelling Denies abdominal pain, nausea, vomiting,diarrhea or constipation.   Denies dysuria, frequency, hesitancy or incontinence. Denies joint pain, swelling and limitation in mobility. Denies headaches, seizures, numbness, or tingling. Denies depression, anxiety or insomnia. Denies skin break down or rash.        Objective:   Physical Exam  Patient alert and oriented and in no cardiopulmonary distress.  HEENT: No facial asymmetry, EOMI, no sinus tenderness,  oropharynx pink and moist.  Neck supple no adenopathy.  Chest: Clear to auscultation bilaterally.  CVS: S1, S2 no murmurs, no S3.  ABD: Soft non tender. Bowel sounds normal.  Ext: No edema  MS: Adequate ROM spine, shoulders, hips and knees.  Skin: Intact, no ulcerations or rash noted.  Psych: Good eye contact, normal affect. Memory intact not anxious or depressed appearing.  CNS: CN 2-12 intact, power, tone and sensation normal throughout.       Assessment & Plan:

## 2012-08-09 NOTE — Patient Instructions (Addendum)
F/u in 4 to 4.5 month  Zostavax and pneumonia vaccines today.  It is important that you exercise regularly at least 30 minutes 5 times a week. If you develop chest pain, have severe difficulty breathing, or feel very tired, stop exercising immediately and seek medical attention    A healthy diet is rich in fruit, vegetables and whole grains. Poultry fish, nuts and beans are a healthy choice for protein rather then red meat. A low sodium diet and drinking 64 ounces of water daily is generally recommended. Oils and sweet should be limited. Carbohydrates especially for those who are diabetic or overweight, should be limited to 34-45 gram per meal. It is important to eat on a regular schedule, at least 3 times daily. Snacks should be primarily fruits, vegetables or nuts.   Blood pressure is a little higher than it should be ideally so work on exercuise and weight loss  OK to use the symbicort 160mg  sample provided this one time

## 2012-08-12 ENCOUNTER — Encounter: Payer: Self-pay | Admitting: Family Medicine

## 2012-08-12 NOTE — Assessment & Plan Note (Signed)
Sub optimal control. No med change. DASH diet and commitment to daily physical activity for a minimum of 30 minutes discussed and encouraged, as a part of hypertension management. The importance of attaining a healthy weight is also discussed.

## 2012-08-12 NOTE — Assessment & Plan Note (Signed)
Patient educated about the importance of limiting  Carbohydrate intake , the need to commit to daily physical activity for a minimum of 30 minutes , and to commit weight loss. The fact that changes in all these areas will reduce or eliminate all together the development of diabetes is stressed.   Updated lab needed 

## 2012-08-12 NOTE — Assessment & Plan Note (Signed)
Controlled, no change in medication Sample of symbicort 160 given to pt, her chronic dose ois unchanged however

## 2012-08-12 NOTE — Assessment & Plan Note (Signed)
Deteriorated. Patient re-educated about  the importance of commitment to a  minimum of 150 minutes of exercise per week. The importance of healthy food choices with portion control discussed. Encouraged to start a food diary, count calories and to consider  joining a support group. Sample diet sheets offered. Goals set by the patient for the next several months.    

## 2012-08-12 NOTE — Assessment & Plan Note (Signed)
No current skin lesion

## 2012-08-17 ENCOUNTER — Other Ambulatory Visit: Payer: Self-pay | Admitting: Family Medicine

## 2012-08-29 ENCOUNTER — Other Ambulatory Visit: Payer: Self-pay | Admitting: Family Medicine

## 2012-09-05 ENCOUNTER — Telehealth: Payer: Self-pay | Admitting: Family Medicine

## 2012-09-05 DIAGNOSIS — R7303 Prediabetes: Secondary | ICD-10-CM

## 2012-09-05 NOTE — Telephone Encounter (Signed)
Pls let py know I received her labs. Cholesterol is high total is 231, LDL is 141, needs to reduce fried and fatty iood intake. pls order, she needs HBA1C asap, this was not done she is an established prediabetic want to check to see that she has not become diabetic, the lab is non fasting

## 2012-09-14 ENCOUNTER — Other Ambulatory Visit: Payer: Self-pay | Admitting: Family Medicine

## 2012-09-19 NOTE — Telephone Encounter (Signed)
Called and left message for patient to return call.  

## 2012-09-20 NOTE — Addendum Note (Signed)
Addended by: Abner Greenspan on: 09/20/2012 03:00 PM   Modules accepted: Orders

## 2012-09-20 NOTE — Telephone Encounter (Signed)
Pt aware.

## 2012-11-20 ENCOUNTER — Other Ambulatory Visit: Payer: Self-pay

## 2012-11-20 MED ORDER — CLOTRIMAZOLE-BETAMETHASONE 1-0.05 % EX CREA: TOPICAL_CREAM | CUTANEOUS | Status: DC

## 2012-12-12 ENCOUNTER — Ambulatory Visit: Payer: BC Managed Care – PPO | Admitting: Family Medicine

## 2012-12-12 ENCOUNTER — Other Ambulatory Visit: Payer: Self-pay | Admitting: Family Medicine

## 2013-01-16 ENCOUNTER — Other Ambulatory Visit: Payer: Self-pay | Admitting: Family Medicine

## 2013-02-06 ENCOUNTER — Other Ambulatory Visit: Payer: Self-pay | Admitting: Family Medicine

## 2013-02-13 ENCOUNTER — Other Ambulatory Visit: Payer: Self-pay | Admitting: Family Medicine

## 2013-02-26 ENCOUNTER — Other Ambulatory Visit: Payer: Self-pay

## 2013-02-26 MED ORDER — TRIAMTERENE-HCTZ 37.5-25 MG PO TABS: ORAL_TABLET | ORAL | Status: DC

## 2013-03-04 ENCOUNTER — Ambulatory Visit: Payer: BC Managed Care – PPO | Admitting: Family Medicine

## 2013-03-06 ENCOUNTER — Encounter: Payer: Self-pay | Admitting: Family Medicine

## 2013-03-06 ENCOUNTER — Ambulatory Visit (INDEPENDENT_AMBULATORY_CARE_PROVIDER_SITE_OTHER): Payer: BC Managed Care – PPO | Admitting: Family Medicine

## 2013-03-06 VITALS — BP 136/74 | HR 76 | Resp 18 | Ht 63.0 in | Wt 309.0 lb

## 2013-03-06 DIAGNOSIS — R5381 Other malaise: Secondary | ICD-10-CM

## 2013-03-06 DIAGNOSIS — R7309 Other abnormal glucose: Secondary | ICD-10-CM

## 2013-03-06 DIAGNOSIS — M79609 Pain in unspecified limb: Secondary | ICD-10-CM

## 2013-03-06 DIAGNOSIS — J4540 Moderate persistent asthma, uncomplicated: Secondary | ICD-10-CM

## 2013-03-06 DIAGNOSIS — M79644 Pain in right finger(s): Secondary | ICD-10-CM

## 2013-03-06 DIAGNOSIS — E669 Obesity, unspecified: Secondary | ICD-10-CM

## 2013-03-06 DIAGNOSIS — I10 Essential (primary) hypertension: Secondary | ICD-10-CM

## 2013-03-06 DIAGNOSIS — E785 Hyperlipidemia, unspecified: Secondary | ICD-10-CM

## 2013-03-06 DIAGNOSIS — R7303 Prediabetes: Secondary | ICD-10-CM

## 2013-03-06 DIAGNOSIS — J45909 Unspecified asthma, uncomplicated: Secondary | ICD-10-CM

## 2013-03-06 LAB — CBC
HCT: 39.8 % (ref 36.0–46.0)
Hemoglobin: 13.2 g/dL (ref 12.0–15.0)
MCV: 82.2 fL (ref 78.0–100.0)
RBC: 4.84 MIL/uL (ref 3.87–5.11)
WBC: 7.6 10*3/uL (ref 4.0–10.5)

## 2013-03-06 LAB — LIPID PANEL
Cholesterol: 206 mg/dL — ABNORMAL HIGH (ref 0–200)
Total CHOL/HDL Ratio: 3.2 Ratio
Triglycerides: 89 mg/dL (ref <150)
VLDL: 18 mg/dL (ref 0–40)

## 2013-03-06 LAB — COMPREHENSIVE METABOLIC PANEL
Albumin: 4.3 g/dL (ref 3.5–5.2)
BUN: 12 mg/dL (ref 6–23)
CO2: 31 mEq/L (ref 19–32)
Calcium: 10.1 mg/dL (ref 8.4–10.5)
Chloride: 99 mEq/L (ref 96–112)
Creat: 0.68 mg/dL (ref 0.50–1.10)
Glucose, Bld: 98 mg/dL (ref 70–99)
Potassium: 3.7 mEq/L (ref 3.5–5.3)

## 2013-03-06 MED ORDER — CEPHALEXIN 500 MG PO CAPS: 500.0000 mg | ORAL_CAPSULE | Freq: Four times a day (QID) | ORAL | Status: AC

## 2013-03-06 NOTE — Patient Instructions (Addendum)
F/u in 4.5 month, call if you need me before  Lipid, cmp, HBA1c, CBc, TSH and Vit D today  Keflex sent in for infected left index finger and you are also referred to dr Romeo Apple, call tomorrow about the appt  You need the flu vaccine  Please schedule your mammogram

## 2013-03-06 NOTE — Progress Notes (Signed)
  Subjective:    Patient ID: Brandy Dean, female    DOB: 1950-10-04, 62 y.o.   MRN: [id number]  HPI 1 week h/o right index pain and swelling , no blunt trauma but patient has been picking at the nail Denies flare of asthma states the symbicort works well., want s to continue  flu vaccine today, and still refusing colonoscopy and pap No recent fever or chills , no yellow green sputum, requests cough syrup for night time use as needed  Review of Systems See HPI Denies recent fever or chills. Denies sinus pressure, nasal congestion, ear pain or sore throat. Denies chest congestion, productive cough or wheezing. Denies chest pains, palpitations and leg swelling Denies abdominal pain, nausea, vomiting,diarrhea or constipation.   Denies dysuria, frequency, hesitancy or incontinence. Denies headaches, seizures, numbness, or tingling. Denies depression, anxiety or insomnia.        Objective:   Physical Exam  Patient alert and oriented and in no cardiopulmonary distress.  HEENT: No facial asymmetry, EOMI, no sinus tenderness,  oropharynx pink and moist.  Neck supple no adenopathy.  Chest: Clear to auscultation bilaterally.  CVS: S1, S2 no murmurs, no S3.  ABD: Soft non tender. Bowel sounds normal.  Ext: No edema  MS: Adequate ROM spine, shoulders, hips and knees.Right index finger swollen and mildly erythematous, no obvious bony deformity or skin breakdown  Skin: Intact, no ulcerations or rash noted.  Psych: Good eye contact, normal affect. Memory intact not anxious or depressed appearing.  CNS: CN 2-12 intact, power, tone and sensation normal throughout.       Assessment & Plan:

## 2013-03-07 ENCOUNTER — Telehealth: Payer: Self-pay | Admitting: Family Medicine

## 2013-03-07 ENCOUNTER — Telehealth: Payer: Self-pay | Admitting: Orthopedic Surgery

## 2013-03-07 LAB — HEMOGLOBIN A1C: Mean Plasma Glucose: 128 mg/dL — ABNORMAL HIGH (ref <117)

## 2013-03-07 LAB — TSH: TSH: 1.409 u[IU]/mL (ref 0.350–4.500)

## 2013-03-07 NOTE — Telephone Encounter (Signed)
I have read the note

## 2013-03-07 NOTE — Assessment & Plan Note (Signed)
Acute pain and swelling of right mid finger with limitation in mobility, antibiotic coverage and ortho eval

## 2013-03-07 NOTE — Assessment & Plan Note (Addendum)
Detyeriorated Patient educated about the importance of limiting  Carbohydrate intake , the need to commit to daily physical activity for a minimum of 30 minutes , and to commit weight loss. The fact that changes in all these areas will reduce or eliminate all together the development of diabetes is stressed.

## 2013-03-07 NOTE — Assessment & Plan Note (Signed)
Controlled, no change in medication DASH diet and commitment to daily physical activity for a minimum of 30 minutes discussed and encouraged, as a part of hypertension management. The importance of attaining a healthy weight is also discussed.  

## 2013-03-07 NOTE — Telephone Encounter (Signed)
Notified primary care office via fax and note entered into referral; left voice message for patient to notify of status.

## 2013-03-07 NOTE — Assessment & Plan Note (Signed)
Improved. Hyperlipidemia:Low fat diet discussed and encouraged.   

## 2013-03-07 NOTE — Telephone Encounter (Signed)
Received note from Dr. Lodema Hong that chart note is completed.  Please review and advise.

## 2013-03-07 NOTE — Assessment & Plan Note (Signed)
Controlled, no change in medication  

## 2013-03-07 NOTE — Assessment & Plan Note (Signed)
Deteriorated. Patient re-educated about  the importance of commitment to a  minimum of 150 minutes of exercise per week. The importance of healthy food choices with portion control discussed. Encouraged to start a food diary, count calories and to consider  joining a support group. Sample diet sheets offered. Goals set by the patient for the next several months.    

## 2013-03-07 NOTE — Telephone Encounter (Signed)
Go to er if having that much of a problem

## 2013-03-07 NOTE — Telephone Encounter (Signed)
Note is not complete  I ll wait to review when Dr simpsons note is complete

## 2013-03-07 NOTE — Telephone Encounter (Signed)
Called, spoke with patient and relalyed per Dr. Mort Sawyers note, and also, offered first available appointment in our office, which is Monday, 03/11/13, due to Dr Harrison's surgery schedule.  Patient mentioned "urgent care" - we reviewed Dr Harrison's note regarding E.R, which she does not wish to do.  Advised to contact Dr. Anthony Sar office, and we will contact them also.  Patient is at work# [phone] until 4:20pm today.

## 2013-03-07 NOTE — Telephone Encounter (Signed)
Please review referral from Dr. Syliva Overman regarding patient Brandy Dean, for evaluation and treatment for problem:  "Infected Right index finger."   Notes are in patient's [facility] Epic chart.  Please advise.  Patient ph# [phone].

## 2013-03-08 ENCOUNTER — Other Ambulatory Visit: Payer: Self-pay | Admitting: Family Medicine

## 2013-03-08 ENCOUNTER — Other Ambulatory Visit: Payer: Self-pay

## 2013-03-08 MED ORDER — ALBUTEROL SULFATE HFA 108 (90 BASE) MCG/ACT IN AERS: 2.0000 | INHALATION_SPRAY | Freq: Four times a day (QID) | RESPIRATORY_TRACT | Status: DC | PRN

## 2013-03-08 MED ORDER — TRIAMTERENE-HCTZ 37.5-25 MG PO TABS: ORAL_TABLET | ORAL | Status: DC

## 2013-03-08 MED ORDER — BUDESONIDE-FORMOTEROL FUMARATE 80-4.5 MCG/ACT IN AERO: INHALATION_SPRAY | RESPIRATORY_TRACT | Status: DC

## 2013-03-08 NOTE — Telephone Encounter (Signed)
Urgent care ok   Er ok

## 2013-03-08 NOTE — Telephone Encounter (Signed)
Primary care, Dr. Anthony Sar office, as of 03/07/13, in process of advising and referring to orthopedist in [place], per LuAnn.

## 2013-03-09 ENCOUNTER — Other Ambulatory Visit: Payer: Self-pay | Admitting: Family Medicine

## 2013-03-12 ENCOUNTER — Telehealth: Payer: Self-pay | Admitting: Family Medicine

## 2013-03-14 ENCOUNTER — Other Ambulatory Visit: Payer: Self-pay

## 2013-03-14 MED ORDER — HYDROCOD POLST-CHLORPHEN POLST 10-8 MG/5ML PO LQCR: ORAL | Status: DC

## 2013-03-14 MED ORDER — ERGOCALCIFEROL 1.25 MG (50000 UT) PO CAPS: 50000.0000 [IU] | ORAL_CAPSULE | ORAL | Status: DC

## 2013-03-14 NOTE — Telephone Encounter (Signed)
Brandy Dean spoke with Brandy Dean

## 2013-03-14 NOTE — Addendum Note (Signed)
Addended by: Kandis Fantasia B on: 03/14/2013 10:09 AM   Modules accepted: Orders

## 2013-04-30 ENCOUNTER — Telehealth: Payer: Self-pay

## 2013-04-30 NOTE — Telephone Encounter (Signed)
Patient states that she took her cough syrup to the pharmacy and they didn't have the full amount so they gave her a small amount and told her she could come back for the rest but the laws changed and when she went back they wouldn't give her the rest without a prescription. I asked her what symptoms she was having and she said none. I asked her if she had a cough and she said well not right now but she likes to have it on hand for when she does because it "knocks it right out" Please advise

## 2013-04-30 NOTE — Telephone Encounter (Signed)
pls print script for last cough syrup she got , I will sign. Explain to pt it has in codeine and should be used only if needed, explain the codeine is addictive as well as now   She will need to have the dispensed amt last for at least 4 month, unable to refill, will need an appt and she should not need to have this available any more often than every 4 month  When she is coughing excessively, more than likely it is her asthma that is uncontrolled pls let her know, and tha twill need OV and prednisone

## 2013-05-01 MED ORDER — HYDROCOD POLST-CHLORPHEN POLST 10-8 MG/5ML PO LQCR: ORAL | Status: DC

## 2013-05-01 NOTE — Telephone Encounter (Signed)
Called patient, no answer 

## 2013-05-01 NOTE — Telephone Encounter (Signed)
Called and left message for patient to return call.  

## 2013-05-02 NOTE — Telephone Encounter (Signed)
Patient aware.  Will come and collect script.

## 2013-05-12 ENCOUNTER — Other Ambulatory Visit: Payer: Self-pay | Admitting: Family Medicine

## 2013-06-07 ENCOUNTER — Emergency Department (HOSPITAL_COMMUNITY): Payer: BC Managed Care – PPO

## 2013-06-07 ENCOUNTER — Emergency Department (HOSPITAL_COMMUNITY)
Admission: EM | Admit: 2013-06-07 | Discharge: 2013-06-07 | Disposition: A | Discharge status: Home / Self Care | Payer: BC Managed Care – PPO | Attending: Emergency Medicine | Admitting: Emergency Medicine

## 2013-06-07 ENCOUNTER — Encounter (HOSPITAL_COMMUNITY): Payer: Self-pay | Admitting: Emergency Medicine

## 2013-06-07 DIAGNOSIS — R0789 Other chest pain: Secondary | ICD-10-CM | POA: Insufficient documentation

## 2013-06-07 DIAGNOSIS — Z8701 Personal history of pneumonia (recurrent): Secondary | ICD-10-CM | POA: Insufficient documentation

## 2013-06-07 DIAGNOSIS — J45901 Unspecified asthma with (acute) exacerbation: Secondary | ICD-10-CM | POA: Insufficient documentation

## 2013-06-07 DIAGNOSIS — Z791 Long term (current) use of non-steroidal anti-inflammatories (NSAID): Secondary | ICD-10-CM | POA: Insufficient documentation

## 2013-06-07 DIAGNOSIS — Z87891 Personal history of nicotine dependence: Secondary | ICD-10-CM | POA: Insufficient documentation

## 2013-06-07 DIAGNOSIS — Z79899 Other long term (current) drug therapy: Secondary | ICD-10-CM | POA: Insufficient documentation

## 2013-06-07 DIAGNOSIS — I10 Essential (primary) hypertension: Secondary | ICD-10-CM | POA: Insufficient documentation

## 2013-06-07 DIAGNOSIS — J40 Bronchitis, not specified as acute or chronic: Secondary | ICD-10-CM

## 2013-06-07 MED ORDER — PREDNISONE 20 MG PO TABS: 40.0000 mg | ORAL_TABLET | Freq: Every day | ORAL | Status: DC

## 2013-06-07 MED ORDER — ALBUTEROL SULFATE 1.25 MG/3ML IN NEBU: 1.0000 | INHALATION_SOLUTION | Freq: Four times a day (QID) | RESPIRATORY_TRACT | Status: DC | PRN

## 2013-06-07 MED ORDER — GUAIFENESIN-CODEINE 100-10 MG/5ML PO SYRP: 5.0000 mL | ORAL_SOLUTION | Freq: Three times a day (TID) | ORAL | Status: DC | PRN

## 2013-06-07 MED ORDER — ALBUTEROL SULFATE (5 MG/ML) 0.5% IN NEBU
5.0000 mg | INHALATION_SOLUTION | Freq: Once | RESPIRATORY_TRACT | Status: AC
  Administered 2013-06-07: 5 mg via RESPIRATORY_TRACT
  Filled 2013-06-07: qty 1

## 2013-06-07 NOTE — ED Notes (Signed)
Pt c/o cold for 6 days, this morning with sob and chest tightness, hx of asthma, took inhaler today and symibcort

## 2013-06-07 NOTE — ED Notes (Signed)
Patient with no complaints at this time. Respirations even and unlabored. Skin warm/dry. Discharge instructions reviewed with patient at this time. Patient given opportunity to voice concerns/ask questions. Patient discharged at this time and left Emergency Department with steady gait.   

## 2013-06-10 ENCOUNTER — Telehealth: Payer: Self-pay | Admitting: Family Medicine

## 2013-06-10 DIAGNOSIS — R5381 Other malaise: Secondary | ICD-10-CM

## 2013-06-10 DIAGNOSIS — R079 Chest pain, unspecified: Secondary | ICD-10-CM

## 2013-06-10 DIAGNOSIS — R7302 Impaired glucose tolerance (oral): Secondary | ICD-10-CM

## 2013-06-10 DIAGNOSIS — I10 Essential (primary) hypertension: Secondary | ICD-10-CM

## 2013-06-10 DIAGNOSIS — R7303 Prediabetes: Secondary | ICD-10-CM

## 2013-06-10 DIAGNOSIS — Z1239 Encounter for other screening for malignant neoplasm of breast: Secondary | ICD-10-CM

## 2013-06-10 DIAGNOSIS — E8881 Metabolic syndrome: Secondary | ICD-10-CM

## 2013-06-10 DIAGNOSIS — R9431 Abnormal electrocardiogram [ECG] [EKG]: Secondary | ICD-10-CM

## 2013-06-10 DIAGNOSIS — E785 Hyperlipidemia, unspecified: Secondary | ICD-10-CM

## 2013-06-10 NOTE — Telephone Encounter (Signed)
Pls order and fax to lab to be drawn in am fasting lipid, cmp and EGFr and hBa1c, she will have ED f/u this Wednesday, she is aware of lab order being sent in I have spoken with her

## 2013-06-10 NOTE — Telephone Encounter (Signed)
Ed f/u call, has  Abn EKG , changed from 2012, needs card re eval, will need fasting labs in am, will let nurse know to fax over order for fasting lipid, cmp and EGFr and hBa1C Will request mammo to be scheduled, states she will go Will address colonoscopy next at visit also pap

## 2013-06-10 NOTE — Addendum Note (Signed)
Addended by: Abner Greenspan on: 06/10/2013 10:20 AM   Modules accepted: Orders

## 2013-06-10 NOTE — Telephone Encounter (Signed)
Lab order faxed.

## 2013-06-10 NOTE — Telephone Encounter (Signed)
Pls sched ED follow up on 12/31 at 2:15, pt is aware and states she will come. i am referring her to cardiology for f/u af change in EKG with c/o chest pain, saw  Dr Gracy Racer in 2012, further studies were recommended, did not opt for that. Pls sched a mammogram states she will go, none in 3 years!Finishes work at 3:30so best option probably on a Monday since they open for longer hrs. Referrals are entered , pls put her on this  Wed pm at 2:30pm with me, thank you

## 2013-06-11 LAB — LIPID PANEL
LDL Cholesterol: 135 mg/dL — ABNORMAL HIGH (ref 0–99)
VLDL: 20 mg/dL (ref 0–40)

## 2013-06-11 LAB — COMPLETE METABOLIC PANEL WITH GFR
ALT: 13 U/L (ref 0–35)
AST: 18 U/L (ref 0–37)
Albumin: 4.1 g/dL (ref 3.5–5.2)
Alkaline Phosphatase: 46 U/L (ref 39–117)
Calcium: 9.9 mg/dL (ref 8.4–10.5)
Chloride: 98 mEq/L (ref 96–112)
Creat: 0.74 mg/dL (ref 0.50–1.10)
GFR, Est African American: >89 mL/min
Potassium: 4.6 mEq/L (ref 3.5–5.3)
Sodium: 138 mEq/L (ref 135–145)
Total Bilirubin: 0.8 mg/dL (ref 0.3–1.2)
Total Protein: 7.2 g/dL (ref 6.0–8.3)

## 2013-06-11 LAB — HEMOGLOBIN A1C: Mean Plasma Glucose: 128 mg/dL — ABNORMAL HIGH (ref <117)

## 2013-06-12 ENCOUNTER — Ambulatory Visit (INDEPENDENT_AMBULATORY_CARE_PROVIDER_SITE_OTHER): Payer: BC Managed Care – PPO | Admitting: Family Medicine

## 2013-06-12 ENCOUNTER — Encounter: Payer: Self-pay | Admitting: Family Medicine

## 2013-06-12 VITALS — BP 134/82 | HR 76 | Resp 18 | Ht 63.0 in | Wt 303.0 lb

## 2013-06-12 DIAGNOSIS — E8881 Metabolic syndrome: Secondary | ICD-10-CM

## 2013-06-12 DIAGNOSIS — R7309 Other abnormal glucose: Secondary | ICD-10-CM

## 2013-06-12 DIAGNOSIS — I10 Essential (primary) hypertension: Secondary | ICD-10-CM

## 2013-06-12 DIAGNOSIS — R7303 Prediabetes: Secondary | ICD-10-CM

## 2013-06-12 DIAGNOSIS — R911 Solitary pulmonary nodule: Secondary | ICD-10-CM

## 2013-06-12 DIAGNOSIS — R9431 Abnormal electrocardiogram [ECG] [EKG]: Secondary | ICD-10-CM

## 2013-06-12 DIAGNOSIS — E785 Hyperlipidemia, unspecified: Secondary | ICD-10-CM

## 2013-06-12 DIAGNOSIS — J4540 Moderate persistent asthma, uncomplicated: Secondary | ICD-10-CM

## 2013-06-12 DIAGNOSIS — R5381 Other malaise: Secondary | ICD-10-CM

## 2013-06-12 DIAGNOSIS — J45991 Cough variant asthma: Secondary | ICD-10-CM

## 2013-06-12 DIAGNOSIS — I4510 Unspecified right bundle-branch block: Secondary | ICD-10-CM

## 2013-06-12 DIAGNOSIS — E669 Obesity, unspecified: Secondary | ICD-10-CM

## 2013-06-12 DIAGNOSIS — J45909 Unspecified asthma, uncomplicated: Secondary | ICD-10-CM

## 2013-06-12 MED ORDER — METFORMIN HCL ER 500 MG PO TB24: 500.0000 mg | ORAL_TABLET | Freq: Every day | ORAL | Status: AC

## 2013-06-12 MED ORDER — METFORMIN HCL ER 500 MG PO TB24: 500.0000 mg | ORAL_TABLET | Freq: Every day | ORAL | Status: DC

## 2013-06-12 NOTE — Patient Instructions (Addendum)
Pelvic exam and follow up in 3 month, call if you need me before  You are referred back to cardiology due to a change in your EKG and incomplete testing when last seen  You are referred for a chest CT scan due t to lung nodule  You will start metformin one daily to help with blood sugar control and hopefully also weight loss as you are pre diabetic  Pls keep mammogram appt  HBA1C chem 7 and EGFR in 3 month  Please commit to reducing sugar intake and fatty foods and losing weight , you need to do this to improve your health

## 2013-06-12 NOTE — ED Provider Notes (Signed)
CSN: [id number]     Arrival date & time 06/07/13  1558 History   First MD Initiated Contact with Patient 06/07/13 1614     Chief Complaint  Patient presents with  . Shortness of Breath   (Consider location/radiation/quality/duration/timing/severity/associated sxs/prior Treatment) HPI  62yf with cough and chest tightness. Has been going for almost a week. Constant and has no seemed to improve at all which is why came to ED today. No fever or chills. Cough is occasionally productive for whitish sputum. Hesitant to endorse chest pain, but says "I just feel right." No unusual leg pain or swelling. Hx of asthma. Some relief with inhaler. No sick contacts.   Past Medical History  Diagnosis Date  . Essential hypertension, benign   . Morbid obesity   . Bronchial asthma   . Sleep apnea   . Bacterial pneumonia     March  9-12,  2011   Past Surgical History  Procedure Laterality Date  . Cesarean section  1988 & 1991  . Right and left keloid ears  1968  . Partial left lobectomy  1968   Family History  Problem Relation Age of Onset  . Obesity Sister   . Esophageal cancer Father   . Stroke Mother   . Hypertension Mother    History  Substance Use Topics  . Smoking status: Former Smoker    Types: Cigarettes    Quit date: 06/14/2007  . Smokeless tobacco: Not on file  . Alcohol Use: No   OB History   Grav Para Term Preterm Abortions TAB SAB Ect Mult Living                 Review of Systems  All systems reviewed and negative, other than as noted in HPI.   Allergies  Benzonatate  Home Medications   Current Outpatient Rx  Name  Route  Sig  Dispense  Refill  . albuterol (PROVENTIL HFA) 108 (90 BASE) MCG/ACT inhaler   Inhalation   Inhale 2 puffs into the lungs every 6 (six) hours as needed.   1 each   3   . budesonide-formoterol (SYMBICORT) 80-4.5 MCG/ACT inhaler   Inhalation   Inhale 1 puff into the lungs 2 (two) times daily.         . chlorpheniramine-HYDROcodone  (TUSSIONEX) 10-8 MG/5ML LQCR   Oral   Take 5 mLs by mouth every 12 (twelve) hours as needed for cough.         . clotrimazole-betamethasone (LOTRISONE) cream   Topical   Apply 1 application topically 2 (two) times daily as needed (FOR IRRITATION).         Marland Kitchen ipratropium-albuterol (DUONEB) 0.5-2.5 (3) MG/3ML SOLN   Nebulization   Take 3 mLs by nebulization every 6 (six) hours as needed.         . triamterene-hydrochlorothiazide (MAXZIDE-25) 37.5-25 MG per tablet   Oral   Take 1 tablet by mouth every morning.         Marland Kitchen albuterol (ACCUNEB) 1.25 MG/3ML nebulizer solution   Nebulization   Take 3 mLs (1.25 mg total) by nebulization every 6 (six) hours as needed for wheezing.   75 mL   12   . guaiFENesin-codeine (ROBITUSSIN AC) 100-10 MG/5ML syrup   Oral   Take 5 mLs by mouth 3 (three) times daily as needed for cough.   120 mL   0   . metFORMIN (GLUCOPHAGE-XR) 500 MG 24 hr tablet   Oral   Take 1 tablet (500 mg  total) by mouth daily with breakfast.   30 tablet   5    BP 118/61  Pulse 74  Temp(Src) 98.9 F (37.2 C) (Oral)  Resp 20  Ht 5\' 4"  (1.626 m)  Wt 309 lb (140.161 kg)  BMI 53.01 kg/m2  SpO2 93% Physical Exam  Nursing note and vitals reviewed. Constitutional: She appears well-developed and well-nourished. No distress.  Laying in bed. NAD. Obese.   HENT:  Head: Normocephalic and atraumatic.  Eyes: Conjunctivae are normal. Right eye exhibits no discharge. Left eye exhibits no discharge.  Neck: Neck supple.  Cardiovascular: Normal rate, regular rhythm and normal heart sounds.  Exam reveals no gallop and no friction rub.   No murmur heard. Pulmonary/Chest: Effort normal and breath sounds normal. No respiratory distress. She has no wheezes.  Mild expiratory wheezing. Speaking in complete sentences. No accessory muscle usage.   Abdominal: Soft. She exhibits no distension. There is no tenderness.  Musculoskeletal: She exhibits no edema and no tenderness.  Lower  extremities symmetric as compared to each other. No calf tenderness. Negative Homan's. No palpable cords.   Neurological: She is alert.  Skin: Skin is warm and dry.  Psychiatric: She has a normal mood and affect. Her behavior is normal. Thought content normal.    ED Course  Procedures (including critical care time) Labs Review Labs Reviewed - No data to display Imaging Review No results found.  EKG Interpretation    Date/Time:  Friday June 07 2013 18:21:14 EST Ventricular Rate:  72 PR Interval:  170 QRS Duration: 94 QT Interval:  382 QTC Calculation: 418 R Axis:   -21 Text Interpretation:  Normal sinus rhythm with sinus arrhythmia Incomplete right bundle branch block Minimal voltage criteria for LVH, may be normal variant Septal infarct , age undetermined Abnormal ECG When compared with ECG of 20-Aug-2009 02:03, No significant change was found ED PHYSICIAN INTERPRETATION AVAILABLE IN [facility] Confirmed by TEST, RECORD ([id number]) on 06/09/2013 8:22:38 AM            MDM   1. Bronchitis    62yf with cough a chest tightness. Atypical for ACS. EKG with no overt ischemic changes. Doubt PE. Suspect bronchitis. Afebrile. No increased WOB. Mild wheezing on exam. CXR w/o infiltrate, but nodule which will need routine follow-up. Plan course of steroids and PRN cough meds/inhaler. Return precautions discussed. Outpt FU.     Raeford Razor, MD 06/12/13 [phone]

## 2013-06-12 NOTE — Progress Notes (Signed)
   Subjective:    Patient ID: Brandy Dean, female    DOB: [birth year], 62 y.o.   MRN: [id number]  HPI Pt  Recently in the Ed and brought in for f/u asthma flare, as well as other outstanding issues of concern States breathing much improved, and does report adherence to maintenance therapy. Record review revealed change in her EKG with new RBB, as well as a persistent lung nodule which requires further imaging.Previously cardiology was incomplete as pt opted out of chemical stress testing at the time, and remains incapable of adequate testing by treadmill only. She will follow through with cardiology recommendations    Review of Systems See HPI Denies recent fever or chills. Denies sinus pressure, nasal congestion, ear pain or sore throat. Denies chest congestion, productive cough or wheezing. Denies chest pains, palpitations , PND, orthopnea and leg swelling Denies abdominal pain, nausea, vomiting,diarrhea or constipation.   Denies dysuria, frequency, hesitancy or incontinence. Denies joint pain, swelling and limitation in mobility. Denies headaches, seizures, numbness, or tingling. Denies depression, anxiety or insomnia. Denies skin break down or rash.        Objective:   Physical Exam  Patient alert and oriented and in no cardiopulmonary distress.  HEENT: No facial asymmetry, EOMI, no sinus tenderness,  oropharynx pink and moist.  Neck supple no adenopathy.  Chest: Clear to auscultation bilaterally.  CVS: S1, S2 no murmurs, no S3.  ABD: Soft non tender. Bowel sounds normal.  Ext: No edema  MS: Adequate ROM spine, shoulders, hips and knees.  Skin: Intact, no ulcerations or rash noted.  Psych: Good eye contact, normal affect. Memory intact not anxious or depressed appearing.  CNS: CN 2-12 intact, power, tone and sensation normal throughout.       Assessment & Plan:

## 2013-06-12 NOTE — Telephone Encounter (Signed)
Patient is coming in at 3:15 today.  

## 2013-06-13 NOTE — Assessment & Plan Note (Signed)
Pt to start metformin Patient educated about the importance of limiting  Carbohydrate intake , the need to commit to daily physical activity for a minimum of 30 minutes , and to commit weight loss. The fact that changes in all these areas will reduce or eliminate all together the development of diabetes is stressed.

## 2013-06-13 NOTE — Assessment & Plan Note (Signed)
H/o nicotine use , needs chest scan

## 2013-06-13 NOTE — Assessment & Plan Note (Signed)
New RBB reported on EKG from recent ED visit . Record review reveals incomplete cardiology , pt will be referred fo cardiology re eval

## 2013-06-13 NOTE — Assessment & Plan Note (Signed)
Pt aware of her increased CV risk and the need to  Modify lifestyle to lower this

## 2013-06-13 NOTE — Assessment & Plan Note (Signed)
Elevated LDL worsened. :Low fat diet discussed and encouraged. Re eval in 4 to 6 month

## 2013-06-13 NOTE — Assessment & Plan Note (Signed)
Unchanged. Patient re-educated about  the importance of commitment to a  minimum of 150 minutes of exercise per week. The importance of healthy food choices with portion control discussed. Encouraged to start a food diary, count calories and to consider  joining a support group. Sample diet sheets offered. Goals set by the patient for the next several months.    

## 2013-06-13 NOTE — Assessment & Plan Note (Signed)
Controlled, no change in medication DASH diet and commitment to daily physical activity for a minimum of 30 minutes discussed and encouraged, as a part of hypertension management. The importance of attaining a healthy weight is also discussed.  

## 2013-06-13 NOTE — Assessment & Plan Note (Signed)
Improved following recent ED visit, importance of maintaining daily preventive therapy is stressed

## 2013-06-18 ENCOUNTER — Other Ambulatory Visit: Payer: Self-pay | Admitting: Family Medicine

## 2013-06-18 ENCOUNTER — Ambulatory Visit: Payer: BC Managed Care – PPO | Admitting: Adult Health

## 2013-06-18 ENCOUNTER — Ambulatory Visit (HOSPITAL_COMMUNITY)
Admission: RE | Admit: 2013-06-18 | Discharge: 2013-06-18 | Disposition: A | Discharge status: Home / Self Care | Payer: BC Managed Care – PPO | Source: Ambulatory Visit | Attending: Family Medicine | Admitting: Family Medicine

## 2013-06-18 DIAGNOSIS — R918 Other nonspecific abnormal finding of lung field: Secondary | ICD-10-CM | POA: Insufficient documentation

## 2013-06-18 DIAGNOSIS — K8020 Calculus of gallbladder without cholecystitis without obstruction: Secondary | ICD-10-CM

## 2013-06-18 DIAGNOSIS — R911 Solitary pulmonary nodule: Secondary | ICD-10-CM

## 2013-06-20 ENCOUNTER — Encounter: Payer: Self-pay | Admitting: Adult Health

## 2013-06-20 ENCOUNTER — Ambulatory Visit (INDEPENDENT_AMBULATORY_CARE_PROVIDER_SITE_OTHER): Payer: BC Managed Care – PPO | Admitting: Adult Health

## 2013-06-20 VITALS — BP 142/59 | HR 75 | Ht 64.0 in

## 2013-06-20 DIAGNOSIS — R9431 Abnormal electrocardiogram [ECG] [EKG]: Secondary | ICD-10-CM

## 2013-06-20 DIAGNOSIS — I10 Essential (primary) hypertension: Secondary | ICD-10-CM

## 2013-06-20 DIAGNOSIS — E669 Obesity, unspecified: Secondary | ICD-10-CM

## 2013-06-20 NOTE — Assessment & Plan Note (Signed)
BP is well controlled but a little elevated on today's visit. Echo will be completed to evaluate diastolic function and LVH.

## 2013-06-20 NOTE — Assessment & Plan Note (Signed)
Patient is advised to increase exercise and lose weight by restricting calories. She states she is working toward that goal Breathing status is limiting some of her activity. She may need to be seen by pulmonologist should symptoms become more frequent or worsen

## 2013-06-20 NOTE — Progress Notes (Signed)
HPI: Brandy Dean is a 63 year old patient of Dr. Diona Browner was then lost to followup since 2012 we are seeing for ongoing assessment and management of your cardiac association class XXIII chronic dyspnea ON exertion. And unspecified chest pain. She has a long history of heart murmur since childhood was not clearly assessed by echocardiogram on recent evaluation 2 years ago. The patient was scheduled for an exercise echocardiogram for evaluation of her symptoms.   This was completed, with study nondiagnostic in light of failure to achieve adequate heart rate response. Appears that patient may have had asthmatic exacerbation with exercise, used an MDI at the end of the study per Ms. Nayellie Sanseverino. She did not have ischemic wall motion abnormalities, however at suboptimal heart rate, this is not diagnostic. Options would be to either consider more aggressive treatment of asthma as well as weight loss to see how symptoms fair, versus considering a pharmacologic stress test, perhaps a dobutamine echocardiogram could be performed since her echocardiographic images were adequate.   She comes today after having had a severe cold with cough and congestion with need for inhaled steroids, abx and expectorants. EKG was completed revealing LVH and incomplete RBBB which is unchanged from previous EKG in 2012. She continues to have issues with her breathing but is improved from acute infection.   Allergies  Allergen Reactions  . Benzonatate Shortness Of Breath    TRIGGERS ASTHMA ATTACK    Current Outpatient Prescriptions  Medication Sig Dispense Refill  . albuterol (ACCUNEB) 1.25 MG/3ML nebulizer solution Take 3 mLs (1.25 mg total) by nebulization every 6 (six) hours as needed for wheezing.  75 mL  12  . albuterol (PROVENTIL HFA) 108 (90 BASE) MCG/ACT inhaler Inhale 2 puffs into the lungs every 6 (six) hours as needed.  1 each  3  . budesonide-formoterol (SYMBICORT) 80-4.5 MCG/ACT inhaler Inhale 1 puff into the lungs  2 (two) times daily.      . chlorpheniramine-HYDROcodone (TUSSIONEX) 10-8 MG/5ML LQCR Take 5 mLs by mouth every 12 (twelve) hours as needed for cough.      . clotrimazole-betamethasone (LOTRISONE) cream Apply 1 application topically 2 (two) times daily as needed (FOR IRRITATION).      Marland Kitchen ipratropium-albuterol (DUONEB) 0.5-2.5 (3) MG/3ML SOLN Take 3 mLs by nebulization every 6 (six) hours as needed.      . metFORMIN (GLUCOPHAGE-XR) 500 MG 24 hr tablet Take 1 tablet (500 mg total) by mouth daily with breakfast.  30 tablet  5  . triamterene-hydrochlorothiazide (MAXZIDE-25) 37.5-25 MG per tablet Take 1 tablet by mouth every morning.      . [DISCONTINUED] pravastatin (PRAVACHOL) 40 MG tablet Take 1 tablet (40 mg total) by mouth daily.  30 tablet  5   No current facility-administered medications for this visit.    Past Medical History  Diagnosis Date  . Essential hypertension, benign   . Morbid obesity   . Bronchial asthma   . Sleep apnea   . Bacterial pneumonia     March  9-12,  2011    Past Surgical History  Procedure Laterality Date  . Cesarean section  1988 & 1991  . Right and left keloid ears  1968  . Partial left lobectomy  1968    ROS: Review of systems complete and found to be negative unless listed above PHYSICAL EXAM BP 142/59  Pulse 75  Ht 5\' 4"  (1.626 m)  SpO2 96%  General: Well developed, well nourished, morbidly obese in no acute distress Head: Eyes PERRLA,  No xanthomas.   Normal cephalic and atramatic  Lungs: Clear bilaterally to auscultation and percussion. Heart: HRRR S1 S2, with 1/6 systolic murmur..  Pulses are 2+ & equal.            No carotid bruit. No JVD.  No abdominal bruits. No femoral bruits. Abdomen: Bowel sounds are positive, abdomen soft and non-tender without masses or                  Hernia's noted. Msk:  Back normal, normal gait. Normal strength and tone for age. Extremities: No clubbing, cyanosis or edema.  DP +1 Neuro: Alert and oriented X  3. Psych:  Good affect, responds appropriately  EKG: NSR with Incomplete RBBB, LVH rate of 56 bpm.  ASSESSMENT AND PLAN

## 2013-06-20 NOTE — Progress Notes (Signed)
Name: Brandy Dean    DOB: [birth year]  Age: 63 y.o.  MR#: [id number]       PCP:  Tula Nakayama, MD      Insurance: Payor: [employer] [place] / Plan: [place] PPO / Product Type: *No Product type* /   CC:    Chief Complaint  Patient presents with  . Chest Pain    VS Filed Vitals:   06/20/13 1606  BP: 142/59  Pulse: 75  Height: _0  (1.626 m)  SpO2: 96%    Weights Current Weight  06/12/13 303 lb (137.44 kg)  06/07/13 309 lb (140.161 kg)  03/06/13 309 lb 0.6 oz (140.18 kg)    Blood Pressure  BP Readings from Last 3 Encounters:  06/20/13 142/59  06/12/13 134/82  06/07/13 118/61     Admit date:  (Not on file) Last encounter with RMR:  Visit date not found   Allergy Benzonatate  Current Outpatient Prescriptions  Medication Sig Dispense Refill  . albuterol (ACCUNEB) 1.25 MG/3ML nebulizer solution Take 3 mLs (1.25 mg total) by nebulization every 6 (six) hours as needed for wheezing.  75 mL  12  . albuterol (PROVENTIL HFA) 108 (90 BASE) MCG/ACT inhaler Inhale 2 puffs into the lungs every 6 (six) hours as needed.  1 each  3  . budesonide-formoterol (SYMBICORT) 80-4.5 MCG/ACT inhaler Inhale 1 puff into the lungs 2 (two) times daily.      . chlorpheniramine-HYDROcodone (TUSSIONEX) 10-8 MG/5ML LQCR Take 5 mLs by mouth every 12 (twelve) hours as needed for cough.      . clotrimazole-betamethasone (LOTRISONE) cream Apply 1 application topically 2 (two) times daily as needed (FOR IRRITATION).      Marland Kitchen ipratropium-albuterol (DUONEB) 0.5-2.5 (3) MG/3ML SOLN Take 3 mLs by nebulization every 6 (six) hours as needed.      . metFORMIN (GLUCOPHAGE-XR) 500 MG 24 hr tablet Take 1 tablet (500 mg total) by mouth daily with breakfast.  30 tablet  5  . triamterene-hydrochlorothiazide (MAXZIDE-25) 37.5-25 MG per tablet Take 1 tablet by mouth every morning.      . [DISCONTINUED] pravastatin (PRAVACHOL) 40 MG tablet Take 1 tablet (40 mg total) by mouth daily.  30 tablet  5   No  current facility-administered medications for this visit.    Discontinued Meds:    Medications Discontinued During This Encounter  Medication Reason  . guaiFENesin-codeine (ROBITUSSIN AC) 100-10 MG/5ML syrup Error    Patient Active Problem List   Diagnosis Date Noted  . Nodule of left lung 06/12/2013  . Nonspecific abnormal electrocardiogram (ECG) (EKG) 06/12/2013  . Prediabetes 11/07/2011  . Metabolic syndrome 23/30/0762  . Chest pain 05/11/2011  . Heart murmur 05/11/2011  . Hyperlipidemia LDL goal < 100 04/24/2011  . Dyspnea on exertion 04/20/2011  . Cough 04/20/2011  . DERMATOMYCOSIS 01/16/2009  . FATIGUE 01/15/2009  . OBESITY 09/27/2007  . HYPERTENSION 09/27/2007  . Asthma, cough variant 09/27/2007    LABS    Component Value Date/Time   NA 138 06/11/2013 0943   NA 136 03/06/2013 1725   NA 141 04/20/2011 1159   K 4.6 06/11/2013 0943   K 3.7 03/06/2013 1725   K 4.1 04/20/2011 1159   CL 98 06/11/2013 0943   CL 99 03/06/2013 1725   CL 100 04/20/2011 1159   CO2 33* 06/11/2013 0943   CO2 31 03/06/2013 1725   CO2 32 04/20/2011 1159   GLUCOSE 79 06/11/2013 0943   GLUCOSE 98 03/06/2013 1725   GLUCOSE  83 04/20/2011 1159   BUN 14 06/11/2013 0943   BUN 12 03/06/2013 1725   BUN 13 04/20/2011 1159   CREATININE 0.74 06/11/2013 0943   CREATININE 0.68 03/06/2013 1725   CREATININE 0.70 04/20/2011 1159   CREATININE 0.61 01/11/2010 1936   CREATININE 0.53 08/21/2009 0541   CREATININE 0.64 08/20/2009 0300   CALCIUM 9.9 06/11/2013 0943   CALCIUM 10.1 03/06/2013 1725   CALCIUM 10.1 04/20/2011 1159   GFRNONAA >60 08/21/2009 0541   GFRNONAA >60 08/20/2009 0300   GFRAA  Value: >60        The eGFR has been calculated using the MDRD equation. This calculation has not been validated in all clinical situations. eGFR's persistently <60 mL/min signify possible Chronic Kidney Disease. 08/21/2009 0541   GFRAA  Value: >60        The eGFR has been calculated using the MDRD equation. This calculation has not  been validated in all clinical situations. eGFR's persistently <60 mL/min signify possible Chronic Kidney Disease. 08/20/2009 0300   CMP     Component Value Date/Time   NA 138 06/11/2013 0943   K 4.6 06/11/2013 0943   CL 98 06/11/2013 0943   CO2 33* 06/11/2013 0943   GLUCOSE 79 06/11/2013 0943   BUN 14 06/11/2013 0943   CREATININE 0.74 06/11/2013 0943   CREATININE 0.61 01/11/2010 1936   CALCIUM 9.9 06/11/2013 0943   PROT 7.2 06/11/2013 0943   ALBUMIN 4.1 06/11/2013 0943   AST 18 06/11/2013 0943   ALT 13 06/11/2013 0943   ALKPHOS 46 06/11/2013 0943   BILITOT 0.8 06/11/2013 0943   GFRNONAA >60 08/21/2009 0541   GFRAA  Value: >60        The eGFR has been calculated using the MDRD equation. This calculation has not been validated in all clinical situations. eGFR's persistently <60 mL/min signify possible Chronic Kidney Disease. 08/21/2009 0541       Component Value Date/Time   WBC 7.6 03/06/2013 1725   WBC 7.9 04/20/2011 1159   WBC 9.2 01/11/2010 1936   HGB 13.2 03/06/2013 1725   HGB 14.1 04/20/2011 1159   HGB 13.7 01/11/2010 1936   HCT 39.8 03/06/2013 1725   HCT 44.1 04/20/2011 1159   HCT 42.6 01/11/2010 1936   MCV 82.2 03/06/2013 1725   MCV 84.6 04/20/2011 1159   MCV 82.9 01/11/2010 1936    Lipid Panel     Component Value Date/Time   CHOL 198 06/11/2013 0943   TRIG 101 06/11/2013 0943   HDL 43 06/11/2013 0943   CHOLHDL 4.6 06/11/2013 0943   VLDL 20 06/11/2013 0943   LDLCALC 135* 06/11/2013 0943    ABG No results found for this basename: phart, pco2, pco2art, po2, po2art, hco3, tco2, acidbasedef, o2sat     Lab Results  Component Value Date   TSH 1.409 03/06/2013   BNP (last 3 results) No results found for this basename: PROBNP,  in the last 8760 hours Cardiac Panel (last 3 results) No results found for this basename: CKTOTAL, CKMB, TROPONINI, RELINDX,  in the last 72 hours  Iron/TIBC/Ferritin No results found for this basename: iron, tibc, ferritin     EKG Orders placed  during the hospital encounter of 06/07/13  . ED EKG  . ED EKG  . EKG 12-LEAD  . EKG 12-LEAD  . EKG     Prior Assessment and Plan Problem List as of 06/20/2013     Cardiovascular and Mediastinum   HYPERTENSION   Last Assessment & Plan  06/12/2013 Office Visit Written 06/13/2013  3:25 PM by Fayrene Helper, MD     Controlled, no change in medication DASH diet and commitment to daily physical activity for a minimum of 30 minutes discussed and encouraged, as a part of hypertension management. The importance of attaining a healthy weight is also discussed.       Respiratory   Asthma, cough variant   Last Assessment & Plan   06/12/2013 Office Visit Written 06/13/2013  3:25 PM by Fayrene Helper, MD     Improved following recent ED visit, importance of maintaining daily preventive therapy is stressed      Endocrine   Prediabetes   Last Assessment & Plan   06/12/2013 Office Visit Written 06/13/2013  3:37 PM by Fayrene Helper, MD     Pt to start metformin Patient educated about the importance of limiting  Carbohydrate intake , the need to commit to daily physical activity for a minimum of 30 minutes , and to commit weight loss. The fact that changes in all these areas will reduce or eliminate all together the development of diabetes is stressed.         Musculoskeletal and Integument   DERMATOMYCOSIS   Last Assessment & Plan   08/09/2012 Office Visit Written 08/12/2012  1:10 PM by Fayrene Helper, MD     No current skin lesion      Other   OBESITY   Last Assessment & Plan   06/12/2013 Office Visit Written 06/13/2013  3:34 PM by Fayrene Helper, MD     Unchanged Patient re-educated about  the importance of commitment to a  minimum of 150 minutes of exercise per week. The importance of healthy food choices with portion control discussed. Encouraged to start a food diary, count calories and to consider  joining a support group. Sample diet sheets offered. Goals set by  the patient for the next several months.       FATIGUE   Dyspnea on exertion   Last Assessment & Plan   05/11/2011 Office Visit Written 05/11/2011  4:07 PM by Satira Sark, MD     Seems to be relatively chronic, and has a history of asthma, although reportedly with good control of asthma on her present regimen. She has had intermittent atypical chest pain symptoms as well, somewhat more prominent within the last few months. Baseline ECG is nonspecific. Cardiac risk factors include morbid obesity, borderline type 2 diabetes mellitus, and hyperlipidemia with LDL of 164. She reports no definite premature cardiovascular disease in her family. She has had no ischemic workup as yet, and we will attempt an exercise echocardiogram for further evaluation.    Cough   Hyperlipidemia LDL goal < 100   Last Assessment & Plan   06/12/2013 Office Visit Written 06/13/2013  3:36 PM by Fayrene Helper, MD     Elevated LDL worsened. :Low fat diet discussed and encouraged. Re eval in 4 to 6 month      Chest pain   Last Assessment & Plan   05/11/2011 Office Visit Written 05/11/2011  4:08 PM by Satira Sark, MD     Largely atypical in description.    Heart murmur   Last Assessment & Plan   05/11/2011 Office Visit Written 05/11/2011  4:09 PM by Satira Sark, MD     Reportedly long-standing. Plan will be an echocardiogram for objective structural assessment, particularly in light of her symptoms.    Metabolic syndrome  Last Assessment & Plan   06/12/2013 Office Visit Written 06/13/2013  3:35 PM by Fayrene Helper, MD     Pt aware of her increased CV risk and the need to  Modify lifestyle to lower this    Nodule of left lung   Last Assessment & Plan   06/12/2013 Office Visit Written 06/13/2013  3:37 PM by Fayrene Helper, MD     H/o nicotine use , needs chest scan    Nonspecific abnormal electrocardiogram (ECG) (EKG)   Last Assessment & Plan   06/12/2013 Office Visit Written  06/13/2013  3:34 PM by Fayrene Helper, MD     New RBB reported on EKG from recent ED visit . Record review reveals incomplete cardiology , pt will be referred fo cardiology re eval        Imaging: Dg Chest 2 View  06/07/2013   CLINICAL DATA:  Followup left upper lobe density noted on a prior exam.  EXAM: CHEST  2 VIEW  COMPARISON:  06/07/2013 at 4:33 p.m.  FINDINGS: There is subtle irregular opacity in the left upper lobe, which has been present on prior studies most likely minor scarring. However, a small mass is not excluded.  Lungs are otherwise clear. No pleural effusion or pneumothorax. Cardiac silhouette is normal in size. The aorta is uncoiled. No mediastinal or hilar masses.  The bony thorax is demineralized with arthropathic changes noted of both shoulders.  IMPRESSION: No acute cardiopulmonary disease.  Probable mild left upper lobe scarring. Possible small left upper lobe nodule. Recommend followup unenhanced chest CT on a nonemergent basis for further evaluation.   Electronically Signed   By: Lajean Manes M.D.   On: 06/07/2013 18:26   Ct Chest Wo Contrast  06/18/2013   CLINICAL DATA:  Left upper lobe nodule questioned on recent chest x-ray.  EXAM: CT CHEST WITHOUT CONTRAST  TECHNIQUE: Multidetector CT imaging of the chest was performed following the standard protocol without IV contrast.  COMPARISON:  Chest x-ray 06/07/2013  FINDINGS: Lungs are well inflated without consolidation or effusion. There is no definite abnormality to account for the questionable chest x-ray finding. There are two 2 mm nodules over the superior segment of the left lower lobe. There is minimal linear scarring versus atelectasis over the anterior right middle lobe, lingula and left lower lobe. Heart is normal in size. There is no hilar, mediastinal or axillary adenopathy. There is minimal calcified plaque over the thoracic aorta.  Images through the upper abdomen demonstrate calcifications over the gallbladder fossa  which may represent cholelithiasis versus gallbladder wall calcification as this is incompletely evaluated on this exam. There are mild degenerative changes of the spine.  IMPRESSION: No acute cardiopulmonary disease. No definite left upper lobe abnormality.  Two adjacent 2 mm nodules over the superior segment of the left lower lobe likely benign. Recommend a follow-up noncontrast chest CT in 1 year. This recommendation follows the consensus statement: Guidelines for Management of Small Pulmonary Nodules Detected on CT Scans: A Statement from the [place] as published in Radiology 2005; 237:395-400. Online at: https://www.arnold.com/.  Calcifications over the gallbladder fossa which may be due to cholelithiasis versus gallbladder wall calcification as this is incompletely evaluated on this exam. Consider further evaluation with right upper quadrant ultrasound.   Electronically Signed   By: Marin Olp M.D.   On: 06/18/2013 16:48   Dg Chest Portable 1 View  06/07/2013   CLINICAL DATA:  Shortness of Breath  EXAM: PORTABLE CHEST -  1 VIEW  COMPARISON:  August 31, 2011  FINDINGS: There is a 1.5 x 0.9 cm opacity either within or overlying the posterior left 3rd rib. Elsewhere lungs are clear. Heart is enlarged with normal pulmonary vascularity. No adenopathy. There is arthropathy in both shoulders.  IMPRESSION: Cardiac enlargement. No edema or consolidation. Opacity either in or overlying the posterior left 3rd rib. Advise upright PA and lateral chest radiograph when patient is clinically able to further assess. Alternatively, noncontrast chest CT to further evaluate this area would be a reasonable alternative. This finding was not convincingly present on the most recent prior study.   Electronically Signed   By: Lowella Grip M.D.   On: 06/07/2013 16:41

## 2013-06-20 NOTE — Assessment & Plan Note (Signed)
No complaints of chest pain at this time. No planned stress testing. EKG is unchanged from previous EKG 2012

## 2013-06-20 NOTE — Patient Instructions (Signed)
Your physician recommends that you schedule a follow-up appointment in: 3 months with Dr Diona BrownerMcDowell  Your physician has requested that you have an echocardiogram. Echocardiography is a painless test that uses sound waves to create images of your heart. It provides your doctor with information about the size and shape of your heart and how well your heart's chambers and valves are working. This procedure takes approximately one hour. There are no restrictions for this procedure.

## 2013-06-24 ENCOUNTER — Ambulatory Visit (HOSPITAL_COMMUNITY)
Admission: RE | Admit: 2013-06-24 | Discharge: 2013-06-24 | Disposition: A | Discharge status: Home / Self Care | Payer: BC Managed Care – PPO | Source: Ambulatory Visit | Attending: Family Medicine | Admitting: Family Medicine

## 2013-06-24 DIAGNOSIS — Z1231 Encounter for screening mammogram for malignant neoplasm of breast: Secondary | ICD-10-CM | POA: Insufficient documentation

## 2013-06-24 DIAGNOSIS — K8020 Calculus of gallbladder without cholecystitis without obstruction: Secondary | ICD-10-CM

## 2013-06-24 DIAGNOSIS — Z1239 Encounter for other screening for malignant neoplasm of breast: Secondary | ICD-10-CM

## 2013-06-24 DIAGNOSIS — K828 Other specified diseases of gallbladder: Secondary | ICD-10-CM | POA: Insufficient documentation

## 2013-06-24 DIAGNOSIS — R7989 Other specified abnormal findings of blood chemistry: Secondary | ICD-10-CM | POA: Insufficient documentation

## 2013-07-03 ENCOUNTER — Telehealth: Payer: Self-pay

## 2013-07-03 DIAGNOSIS — K8020 Calculus of gallbladder without cholecystitis without obstruction: Secondary | ICD-10-CM

## 2013-07-03 NOTE — Telephone Encounter (Signed)
Patient changed her mind and would like to get the referral to Dr Lovell [name]

## 2013-07-03 NOTE — Telephone Encounter (Signed)
Noted, pls refer pt to Dr Lovell [name] re gallstones, referral is entered

## 2013-07-18 ENCOUNTER — Ambulatory Visit: Payer: BC Managed Care – PPO | Admitting: Family Medicine

## 2013-08-21 ENCOUNTER — Other Ambulatory Visit: Payer: Self-pay | Admitting: Family Medicine

## 2013-09-02 ENCOUNTER — Encounter: Payer: Self-pay | Admitting: Family Medicine

## 2013-09-02 ENCOUNTER — Other Ambulatory Visit (HOSPITAL_COMMUNITY)
Admission: RE | Admit: 2013-09-02 | Discharge: 2013-09-02 | Disposition: A | Discharge status: Home / Self Care | Payer: BC Managed Care – PPO | Source: Ambulatory Visit | Attending: Family Medicine | Admitting: Family Medicine

## 2013-09-02 ENCOUNTER — Ambulatory Visit (INDEPENDENT_AMBULATORY_CARE_PROVIDER_SITE_OTHER): Payer: BC Managed Care – PPO | Admitting: Family Medicine

## 2013-09-02 ENCOUNTER — Encounter (INDEPENDENT_AMBULATORY_CARE_PROVIDER_SITE_OTHER): Payer: Self-pay

## 2013-09-02 VITALS — BP 132/80 | HR 64 | Resp 18 | Ht 63.0 in | Wt 305.1 lb

## 2013-09-02 DIAGNOSIS — Z1211 Encounter for screening for malignant neoplasm of colon: Secondary | ICD-10-CM

## 2013-09-02 DIAGNOSIS — Z124 Encounter for screening for malignant neoplasm of cervix: Secondary | ICD-10-CM | POA: Insufficient documentation

## 2013-09-02 DIAGNOSIS — R87810 Cervical high risk human papillomavirus (HPV) DNA test positive: Secondary | ICD-10-CM | POA: Insufficient documentation

## 2013-09-02 DIAGNOSIS — Z0000 Encounter for general adult medical examination without abnormal findings: Secondary | ICD-10-CM

## 2013-09-02 DIAGNOSIS — E785 Hyperlipidemia, unspecified: Secondary | ICD-10-CM

## 2013-09-02 DIAGNOSIS — Z1151 Encounter for screening for human papillomavirus (HPV): Secondary | ICD-10-CM | POA: Insufficient documentation

## 2013-09-02 DIAGNOSIS — Z1212 Encounter for screening for malignant neoplasm of rectum: Secondary | ICD-10-CM

## 2013-09-02 DIAGNOSIS — R7303 Prediabetes: Secondary | ICD-10-CM

## 2013-09-02 LAB — POC HEMOCCULT BLD/STL (OFFICE/1-CARD/DIAGNOSTIC): Fecal Occult Blood, POC: NEGATIVE

## 2013-09-02 NOTE — Progress Notes (Signed)
   Subjective:    Patient ID: Brandy Dean, female    DOB: 10/15/1950, 63 y.o.   MRN: [id number]  HPI Patient is in for annual exam Health maintainance is reviewed and updated, specifically screening tests and recommended immunizations. Recent lab and radiologic data, since previous visit is also reviewed with the patient. Healthy lifestyle as far as commitment to regular physical activity, heart healthy diet , safe habits, as far as seat belt   is discussed. The importance of adequate rest is also discussed.       Review of Systems See HPI Denies recent fever or chills. Denies sinus pressure, nasal congestion, ear pain or sore throat. Denies chest congestion, productive cough or wheezing. Denies chest pains, palpitations and leg swelling Denies abdominal pain, nausea, vomiting,diarrhea or constipation.   Denies dysuria, frequency, hesitancy or incontinence. Denies joint pain, swelling and limitation in mobility. Denies headaches, seizures, numbness, or tingling. Denies depression, anxiety or insomnia. Denies skin break down or rash.        Objective:   Physical Exam Pleasant morbidly obese female, alert and oriented x 3, in no cardio-pulmonary distress. Afebrile. HEENT No facial trauma or asymetry. Sinuses non tender.  EOMI, PERTL, fundoscopic exam is normal, no hemorhage or exudate.  External ears normal, tympanic membranes clear. Oropharynx moist, no exudate,fairly  good dentition. Neck: supple, no adenopathy,JVD or thyromegaly.No bruits.  Chest: Clear to ascultation bilaterally.No crackles or wheezes. Non tender to palpation  Breast: No asymetry,no masses. No nipple discharge or inversion. No axillary or supraclavicular adenopathy  Cardiovascular system; Heart sounds normal,  S1 and  S2 ,no S3.  No murmur, or thrill. Apical beat not displaced Peripheral pulses normal.  Abdomen: Soft, non tender, no organomegaly or masses. No bruits. Bowel sounds  normal. No guarding, tenderness or rebound.  Rectal:  No mass. Guaiac negative stool.  GU: External genitalia normal. No lesions. Vaginal canal normal.white  discharge. Uterus normal size, no adnexal masses, no cervical motion or adnexal tenderness.  Musculoskeletal exam: Full though reduced  ROM of spine, hips , shoulders and knees. No deformity ,swelling or crepitus noted. No muscle wasting or atrophy.   Neurologic: Cranial nerves 2 to 12 intact. Power, tone ,sensation and reflexes normal throughout. No disturbance in gait. No tremor.  Skin: Intact, no ulceration, erythema , scaling or rash noted. Pigmentation normal throughout  Psych; Normal mood and affect. Judgement and concentration normal        Assessment & Plan:  Routine general medical examination at a health care facility Annual exam as documented. Counseling done  re healthy lifestyle involving commitment to 150 minutes exercise per week, heart healthy diet, and attaining healthy weight.The importance of adequate sleep also discussed. Regular seat belt use and safe storage  of firearms if patient has them, is also discussed. Changes in health habits are decided on by the patient with goals and time frames  set for achieving them. Immunization and cancer screening needs are specifically addressed at this visit.

## 2013-09-02 NOTE — Patient Instructions (Signed)
F/u in 4 month, call if you need me before  Please commit to regular exercise and change in diet to help with weight loss.  Goal of 2.5 pounds per month  Fasting labs befiore next visit  Still need colonoscopy

## 2013-09-10 ENCOUNTER — Encounter: Payer: Self-pay | Admitting: Family Medicine

## 2013-09-10 DIAGNOSIS — R87810 Cervical high risk human papillomavirus (HPV) DNA test positive: Secondary | ICD-10-CM | POA: Insufficient documentation

## 2013-09-15 DIAGNOSIS — Z0000 Encounter for general adult medical examination without abnormal findings: Secondary | ICD-10-CM | POA: Insufficient documentation

## 2013-09-15 NOTE — Assessment & Plan Note (Signed)
Annual exam as documented. Counseling done  re healthy lifestyle involving commitment to 150 minutes exercise per week, heart healthy diet, and attaining healthy weight.The importance of adequate sleep also discussed. Regular seat belt use and safe storage  of firearms if patient has them, is also discussed. Changes in health habits are decided on by the patient with goals and time frames  set for achieving them. Immunization and cancer screening needs are specifically addressed at this visit.  

## 2013-10-22 ENCOUNTER — Other Ambulatory Visit: Payer: Self-pay | Admitting: Family Medicine

## 2013-11-06 ENCOUNTER — Telehealth: Payer: Self-pay

## 2013-11-06 NOTE — Telephone Encounter (Signed)
Pt was referred by Dr. Lodema Hong for screening colonoscopy. LMOM for a return call.

## 2013-12-02 ENCOUNTER — Other Ambulatory Visit: Payer: Self-pay | Admitting: Family Medicine

## 2013-12-18 NOTE — Telephone Encounter (Signed)
Pt not responded to letter or phone call. Routed the referral info to [facility].

## 2014-01-03 ENCOUNTER — Other Ambulatory Visit: Payer: Self-pay | Admitting: Family Medicine

## 2014-01-06 ENCOUNTER — Other Ambulatory Visit: Payer: Self-pay

## 2014-01-06 ENCOUNTER — Telehealth: Payer: Self-pay

## 2014-01-06 MED ORDER — TRIAMTERENE-HCTZ 37.5-25 MG PO TABS: ORAL_TABLET | ORAL | Status: DC

## 2014-01-06 MED ORDER — HYDROCOD POLST-CHLORPHEN POLST 10-8 MG/5ML PO LQCR: 5.0000 mL | Freq: Two times a day (BID) | ORAL | Status: DC | PRN

## 2014-01-06 NOTE — Telephone Encounter (Signed)
pls refill tussionex x 1, let her knwo

## 2014-01-06 NOTE — Telephone Encounter (Signed)
Patient would like to know if she can have a refill on Tussionex.  Please advise.

## 2014-01-06 NOTE — Telephone Encounter (Signed)
Med refilled. Patient aware to come and collect this afternoon.

## 2014-02-25 ENCOUNTER — Other Ambulatory Visit: Payer: Self-pay | Admitting: Family Medicine

## 2014-03-18 ENCOUNTER — Other Ambulatory Visit: Payer: Self-pay | Admitting: Family Medicine

## 2014-04-30 ENCOUNTER — Other Ambulatory Visit: Payer: Self-pay | Admitting: Family Medicine

## 2014-05-06 ENCOUNTER — Ambulatory Visit (INDEPENDENT_AMBULATORY_CARE_PROVIDER_SITE_OTHER): Payer: BC Managed Care – PPO | Admitting: Family Medicine

## 2014-05-06 ENCOUNTER — Encounter: Payer: Self-pay | Admitting: Family Medicine

## 2014-05-06 VITALS — BP 146/82 | HR 98 | Resp 20 | Ht 63.0 in | Wt 320.0 lb

## 2014-05-06 DIAGNOSIS — Z91199 Patient's noncompliance with other medical treatment and regimen due to unspecified reason: Secondary | ICD-10-CM

## 2014-05-06 DIAGNOSIS — R7303 Prediabetes: Secondary | ICD-10-CM

## 2014-05-06 DIAGNOSIS — E8881 Metabolic syndrome: Secondary | ICD-10-CM

## 2014-05-06 DIAGNOSIS — Z6841 Body Mass Index (BMI) 40.0 and over, adult: Secondary | ICD-10-CM

## 2014-05-06 DIAGNOSIS — E785 Hyperlipidemia, unspecified: Secondary | ICD-10-CM

## 2014-05-06 DIAGNOSIS — Z9119 Patient's noncompliance with other medical treatment and regimen: Secondary | ICD-10-CM

## 2014-05-06 DIAGNOSIS — I10 Essential (primary) hypertension: Secondary | ICD-10-CM

## 2014-05-06 DIAGNOSIS — R7309 Other abnormal glucose: Secondary | ICD-10-CM

## 2014-05-06 DIAGNOSIS — E559 Vitamin D deficiency, unspecified: Secondary | ICD-10-CM

## 2014-05-06 DIAGNOSIS — J45991 Cough variant asthma: Secondary | ICD-10-CM

## 2014-05-06 DIAGNOSIS — R87810 Cervical high risk human papillomavirus (HPV) DNA test positive: Secondary | ICD-10-CM

## 2014-05-06 MED ORDER — HYDROCOD POLST-CHLORPHEN POLST 10-8 MG/5ML PO LQCR: 5.0000 mL | Freq: Two times a day (BID) | ORAL | Status: DC | PRN

## 2014-05-06 MED ORDER — TRIAMTERENE-HCTZ 75-50 MG PO TABS: 1.0000 | ORAL_TABLET | Freq: Every day | ORAL | Status: DC

## 2014-05-06 MED ORDER — BUDESONIDE-FORMOTEROL FUMARATE 80-4.5 MCG/ACT IN AERO: 2.0000 | INHALATION_SPRAY | Freq: Two times a day (BID) | RESPIRATORY_TRACT | Status: DC

## 2014-05-06 MED ORDER — ALBUTEROL SULFATE HFA 108 (90 BASE) MCG/ACT IN AERS: 2.0000 | INHALATION_SPRAY | Freq: Four times a day (QID) | RESPIRATORY_TRACT | Status: DC | PRN

## 2014-05-06 NOTE — Progress Notes (Signed)
Subjective:    Patient ID: Brandy Dean, female    DOB: 06/17/1950, 63 y.o.   MRN: [id number]  HPI The PT is here for follow up and re-evaluation of chronic medical conditions, medication management and review of any available recent lab and radiology data. Labs and follow up are markedly  overdue Preventive health is updated, specifically  Cancer screening and Immunization.  Refuses needed immunization and still has not her colonoscopy  The PT denies any adverse reactions to current medications since the last visit.  There are no new concerns.  There are no specific complaints    ]   Review of Systems See HPI Denies recent fever or chills. Denies sinus pressure, nasal congestion, ear pain or sore throat. Denies chest congestion, productive cough or wheezing. Denies chest pains, palpitations and leg swelling Denies abdominal pain, nausea, vomiting,diarrhea or constipation.   Denies dysuria, frequency, hesitancy or incontinence. Denies significant  joint pain, swelling and limitation in mobility. Denies headaches, seizures, numbness, or tingling. Denies depression, anxiety or insomnia. Denies skin break down or rash.        Objective:   Physical Exam BP 146/82 mmHg  Pulse 98  Resp 20  Ht 5\' 3"  (1.6 m)  Wt 320 lb (145.151 kg)  BMI 56.70 kg/m2  SpO2 93%  Patient alert and oriented and in no cardiopulmonary distress.  HEENT: No facial asymmetry, EOMI,   oropharynx pink and moist.  Neck supple no JVD, no mass.  Chest: Clear to auscultation bilaterally.  CVS: S1, S2 no murmurs, no S3.Regular rate.  ABD: Soft non tender.   Ext: No edema  MS: Adequate ROM spine, shoulders, hips and knees.  Skin: Intact, no ulcerations or rash noted.  Psych: Good eye contact, normal affect. Memory intact not anxious or depressed appearing.  CNS: CN 2-12 intact, power,  normal throughout.no focal deficits noted.        Assessment & Plan:  Essential  hypertension Uncontrolled, dose increase in medicaton DASH diet and commitment to daily physical activity for a minimum of 30 minutes discussed and encouraged, as a part of hypertension management. The importance of attaining a healthy weight is also discussed.   Asthma, cough variant Stable and controlled, no recent flare  Prediabetes Patient educated about the importance of limiting  Carbohydrate intake , the need to commit to daily physical activity for a minimum of 30 minutes , and to commit weight loss. The fact that changes in all these areas will reduce or eliminate all together the development of diabetes is stressed.   Updated lab needed and is past due.   Hyperlipidemia with target LDL less than 100 Hyperlipidemia:Low fat diet discussed and encouraged.  Updated lab needed    Morbid obesity with BMI of 50.0-59.9, adult Deteriorated. Patient re-educated about  the importance of commitment to a  minimum of 150 minutes of exercise per week. The importance of healthy food choices with portion control discussed. Encouraged to start a food diary, count calories and to consider  joining a support group. Sample diet sheets offered. Goals set by the patient for the next several months.     Metabolic syndrome The increased risk of cardiovascular disease associated with this diagnosis, and the need to consistently work on lifestyle to change this is discussed. Following  a  heart healthy diet ,commitment to 30 minutes of exercise at least 5 days per week, as well as control of blood sugar and cholesterol , and achieving a healthy weight are  all the areas to be addressed .   Medically noncompliant Pt has not kept f/u appts as scheduled with a view to encouraging h improved health, weight loss and improvement in blood sugar and cholesterol Re educated re th need to change non compliant behavior. 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

## 2014-05-06 NOTE — Patient Instructions (Addendum)
F/u in 2 month, call if you need me before  You have gained 15 pounds since you were last here. Please stop junk food, eat small portions often, mainly vegetable and fruit fresh or frozen  Please schedule mammogram, due Jan 13 or after  Come back for flu vaccine you need this  Call and get colonoscopy done please  BP is high, new dose maxzide is 50mg one daily take to the pharmacy  It is important that you exercise regularly at least 30 minutes 5 times a week. If you develop chest pain, have severe difficulty breathing, or feel very tired, stop exercising immediately and seek medical attention    Fasting lipid, cmp and EGFR, HBA1C, TSH , CBC and Vit D next week please  Commit to 64 ounces water daily 

## 2014-05-10 DIAGNOSIS — Z9119 Patient's noncompliance with other medical treatment and regimen: Secondary | ICD-10-CM | POA: Insufficient documentation

## 2014-05-10 DIAGNOSIS — Z91199 Patient's noncompliance with other medical treatment and regimen due to unspecified reason: Secondary | ICD-10-CM | POA: Insufficient documentation

## 2014-05-10 NOTE — Assessment & Plan Note (Signed)
Stable and controlled , no recent flare 

## 2014-05-10 NOTE — Assessment & Plan Note (Signed)
Hyperlipidemia:Low fat diet discussed and encouraged.  Updated lab needed 

## 2014-05-10 NOTE — Assessment & Plan Note (Signed)
The increased risk of cardiovascular disease associated with this diagnosis, and the need to consistently work on lifestyle to change this is discussed. Following  a  heart healthy diet ,commitment to 30 minutes of exercise at least 5 days per week, as well as control of blood sugar and cholesterol , and achieving a healthy weight are all the areas to be addressed .  

## 2014-05-10 NOTE — Assessment & Plan Note (Signed)
Pt has not kept f/u appts as scheduled with a view to encouraging h improved health, weight loss and improvement in blood sugar and cholesterol Re educated re th need to change non compliant behavior. Refuses flu vaccine at visit , despite being asthmatic, educated re need for this Still needs her colonoscopy states she will call to heave this done

## 2014-05-10 NOTE — Assessment & Plan Note (Signed)
Needs rept pap first week in April will schedule this appt also, pt understands the need for rept pap as this was explained at visit

## 2014-05-10 NOTE — Assessment & Plan Note (Signed)
Patient educated about the importance of limiting  Carbohydrate intake , the need to commit to daily physical activity for a minimum of 30 minutes , and to commit weight loss. The fact that changes in all these areas will reduce or eliminate all together the development of diabetes is stressed.   Updated lab needed and is past due.

## 2014-05-10 NOTE — Assessment & Plan Note (Signed)
Deteriorated. Patient re-educated about  the importance of commitment to a  minimum of 150 minutes of exercise per week. The importance of healthy food choices with portion control discussed. Encouraged to start a food diary, count calories and to consider  joining a support group. Sample diet sheets offered. Goals set by the patient for the next several months.    

## 2014-05-10 NOTE — Assessment & Plan Note (Signed)
Uncontrolled, dose increase in medicaton DASH diet and commitment to daily physical activity for a minimum of 30 minutes discussed and encouraged, as a part of hypertension management. The importance of attaining a healthy weight is also discussed.

## 2014-07-03 ENCOUNTER — Ambulatory Visit: Payer: BC Managed Care – PPO | Admitting: Family Medicine

## 2014-07-03 ENCOUNTER — Telehealth: Payer: Self-pay | Admitting: Family Medicine

## 2014-07-03 NOTE — Telephone Encounter (Signed)
plsm see response

## 2014-07-03 NOTE — Telephone Encounter (Signed)
Patient is asking for refill on Tussionex.  Is this ok?

## 2014-07-03 NOTE — Telephone Encounter (Signed)
pls remind her to have fasting labs before Feb appt which she needs to keep. Refill tussionex x 1 pls

## 2014-07-08 ENCOUNTER — Other Ambulatory Visit: Payer: Self-pay

## 2014-07-08 MED ORDER — HYDROCOD POLST-CHLORPHEN POLST 10-8 MG/5ML PO LQCR: 5.0000 mL | Freq: Two times a day (BID) | ORAL | Status: DC | PRN

## 2014-07-08 NOTE — Telephone Encounter (Signed)
Patient aware and will have labs done prior to visit.

## 2014-07-12 ENCOUNTER — Emergency Department (HOSPITAL_COMMUNITY): Payer: BC Managed Care – PPO

## 2014-07-12 ENCOUNTER — Emergency Department (HOSPITAL_COMMUNITY)
Admission: EM | Admit: 2014-07-12 | Discharge: 2014-07-12 | Disposition: A | Discharge status: Home / Self Care | Payer: BC Managed Care – PPO | Attending: Emergency Medicine | Admitting: Emergency Medicine

## 2014-07-12 ENCOUNTER — Encounter (HOSPITAL_COMMUNITY): Payer: Self-pay | Admitting: Emergency Medicine

## 2014-07-12 DIAGNOSIS — J45901 Unspecified asthma with (acute) exacerbation: Secondary | ICD-10-CM | POA: Insufficient documentation

## 2014-07-12 DIAGNOSIS — Z8669 Personal history of other diseases of the nervous system and sense organs: Secondary | ICD-10-CM | POA: Diagnosis not present

## 2014-07-12 DIAGNOSIS — R0602 Shortness of breath: Secondary | ICD-10-CM | POA: Diagnosis present

## 2014-07-12 DIAGNOSIS — I10 Essential (primary) hypertension: Secondary | ICD-10-CM | POA: Insufficient documentation

## 2014-07-12 DIAGNOSIS — J189 Pneumonia, unspecified organism: Secondary | ICD-10-CM

## 2014-07-12 DIAGNOSIS — Z87891 Personal history of nicotine dependence: Secondary | ICD-10-CM | POA: Diagnosis not present

## 2014-07-12 DIAGNOSIS — Z79899 Other long term (current) drug therapy: Secondary | ICD-10-CM | POA: Diagnosis not present

## 2014-07-12 DIAGNOSIS — J159 Unspecified bacterial pneumonia: Secondary | ICD-10-CM | POA: Insufficient documentation

## 2014-07-12 LAB — COMPREHENSIVE METABOLIC PANEL
ALT: 15 U/L (ref 0–35)
AST: 18 U/L (ref 0–37)
Albumin: 4.3 g/dL (ref 3.5–5.2)
Alkaline Phosphatase: 57 U/L (ref 39–117)
Anion gap: 6 (ref 5–15)
BILIRUBIN TOTAL: 0.6 mg/dL (ref 0.3–1.2)
BUN: 17 mg/dL (ref 6–23)
CALCIUM: 9.7 mg/dL (ref 8.4–10.5)
CO2: 30 mmol/L (ref 19–32)
CREATININE: 0.74 mg/dL (ref 0.50–1.10)
Chloride: 102 mmol/L (ref 96–112)
GFR calc Af Amer: >90 mL/min (ref >90)
GFR calc non Af Amer: 89 mL/min — ABNORMAL LOW (ref >90)
GLUCOSE: 96 mg/dL (ref 70–99)
Potassium: 3.9 mmol/L (ref 3.5–5.1)
Sodium: 138 mmol/L (ref 135–145)
Total Protein: 7.9 g/dL (ref 6.0–8.3)

## 2014-07-12 LAB — CBC WITH DIFFERENTIAL/PLATELET
BASOS ABS: 0 10*3/uL (ref 0.0–0.1)
Basophils Relative: 0 % (ref 0–1)
Eosinophils Absolute: 0.2 10*3/uL (ref 0.0–0.7)
Eosinophils Relative: 2 % (ref 0–5)
HCT: 41 % (ref 36.0–46.0)
Hemoglobin: 13 g/dL (ref 12.0–15.0)
Lymphocytes Relative: 27 % (ref 12–46)
Lymphs Abs: 2.4 10*3/uL (ref 0.7–4.0)
MCH: 26.8 pg (ref 26.0–34.0)
MCHC: 31.7 g/dL (ref 30.0–36.0)
MCV: 84.5 fL (ref 78.0–100.0)
MONOS PCT: 7 % (ref 3–12)
Monocytes Absolute: 0.7 10*3/uL (ref 0.1–1.0)
NEUTROS ABS: 5.6 10*3/uL (ref 1.7–7.7)
Neutrophils Relative %: 64 % (ref 43–77)
Platelets: 233 10*3/uL (ref 150–400)
RBC: 4.85 MIL/uL (ref 3.87–5.11)
RDW: 14.6 % (ref 11.5–15.5)
WBC: 8.9 10*3/uL (ref 4.0–10.5)

## 2014-07-12 LAB — BRAIN NATRIURETIC PEPTIDE: B Natriuretic Peptide: 53 pg/mL (ref 0.0–100.0)

## 2014-07-12 LAB — TROPONIN I
Troponin I: <0.03 ng/mL (ref <0.031)
Troponin I: <0.03 ng/mL (ref <0.031)

## 2014-07-12 LAB — D-DIMER, QUANTITATIVE: D-Dimer, Quant: 0.53 ug/mL-FEU — ABNORMAL HIGH (ref 0.00–0.48)

## 2014-07-12 MED ORDER — PREDNISONE 50 MG PO TABS
60.0000 mg | ORAL_TABLET | Freq: Once | ORAL | Status: AC
  Administered 2014-07-12: 60 mg via ORAL
  Filled 2014-07-12 (×2): qty 1

## 2014-07-12 MED ORDER — IPRATROPIUM-ALBUTEROL 0.5-2.5 (3) MG/3ML IN SOLN
3.0000 mL | Freq: Once | RESPIRATORY_TRACT | Status: AC
  Administered 2014-07-12: 3 mL via RESPIRATORY_TRACT
  Filled 2014-07-12: qty 3

## 2014-07-12 MED ORDER — PREDNISONE 50 MG PO TABS: ORAL_TABLET | ORAL | Status: DC

## 2014-07-12 MED ORDER — ALBUTEROL SULFATE HFA 108 (90 BASE) MCG/ACT IN AERS: 2.0000 | INHALATION_SPRAY | Freq: Four times a day (QID) | RESPIRATORY_TRACT | Status: DC | PRN

## 2014-07-12 MED ORDER — ALBUTEROL SULFATE 1.25 MG/3ML IN NEBU: 1.0000 | INHALATION_SOLUTION | Freq: Four times a day (QID) | RESPIRATORY_TRACT | Status: DC | PRN

## 2014-07-12 MED ORDER — LEVOFLOXACIN 500 MG PO TABS: 500.0000 mg | ORAL_TABLET | Freq: Every day | ORAL | Status: DC

## 2014-07-12 MED ORDER — IOHEXOL 350 MG/ML SOLN
100.0000 mL | Freq: Once | INTRAVENOUS | Status: AC | PRN
  Administered 2014-07-12: 100 mL via INTRAVENOUS

## 2014-07-12 NOTE — ED Notes (Addendum)
Pt reports chest congestion,cough, chest tightness for last several days. Pt reports used last inhaler this am with minimal relief. Moderate dyspnea noted in triage.

## 2014-07-12 NOTE — ED Provider Notes (Signed)
CSN: [id number]     Arrival date & time 07/12/14  1806 History  This chart was scribed for Brandy Octave, MD by Modena Jansky, ED Scribe. This patient was seen in room APA07/APA07 and the patient's care was started at 6:24 PM.   Chief Complaint  Patient presents with  . Shortness of Breath   The history is provided by the patient. No language interpreter was used.   HPI Comments: Brandy Dean is a 64 y.o. female with a hx of asthma who presents to the Emergency Department complaining of moderate intermittent cough that started about for about a week ago. She states that she has been having a URI with chest congestion for about 10 days. She reports that she had an appointment scheduled that she could go to because she was so sick. She states that her cough is productive of yellow-green sputum and keeps her up at night. She reports that she took her inhaler today with minimal relief. She states that she also has intermittent non radiating substernal chest pain, with episodes lasting seconds. She reports that she has been having headaches, but currently has no headache. She states that she had an episode of subjective fever. Pt's temperature in the ED is 98.4. She reports that she is currently on Symbicort and Proventil as needed.  Sje states that she has a hx of HTN. She reports no sick contacts or recent travel. She denies any hx of heart problems, or smoking. She denies any leg swelling.   Past Medical History  Diagnosis Date  . Essential hypertension, benign   . Morbid obesity   . Bronchial asthma   . Sleep apnea   . Bacterial pneumonia     March  9-12,  2011   Past Surgical History  Procedure Laterality Date  . Cesarean section  1988 & 1991  . Right and left keloid ears  1968  . Partial left lobectomy  1968   Family History  Problem Relation Age of Onset  . Obesity Sister   . Esophageal cancer Father   . Stroke Mother   . Hypertension Mother    History  Substance Use Topics   . Smoking status: Former Smoker    Types: Cigarettes    Quit date: 06/14/2007  . Smokeless tobacco: Not on file  . Alcohol Use: No   OB History    No data available     Review of Systems A complete 10 system review of systems was obtained and all systems are negative except as noted in the HPI and PMH.  Allergies  Benzonatate  Home Medications   Prior to Admission medications   Medication Sig Start Date End Date Taking? Authorizing Provider  budesonide-formoterol (SYMBICORT) 80-4.5 MCG/ACT inhaler Inhale 2 puffs into the lungs 2 (two) times daily. 05/06/14  Yes Kerri Perches, MD  clotrimazole-betamethasone (LOTRISONE) cream Apply 1 application topically daily as needed. Rash 05/18/14  Yes Historical Provider, MD  triamterene-hydrochlorothiazide (MAXZIDE) 75-50 MG per tablet Take 1 tablet by mouth daily. 05/06/14  Yes Kerri Perches, MD  albuterol (ACCUNEB) 1.25 MG/3ML nebulizer solution Take 3 mLs (1.25 mg total) by nebulization every 6 (six) hours as needed for wheezing. 07/12/14   Brandy Octave, MD  albuterol (PROVENTIL HFA;VENTOLIN HFA) 108 (90 BASE) MCG/ACT inhaler Inhale 2 puffs into the lungs every 6 (six) hours as needed for wheezing or shortness of breath. 07/12/14   Brandy Octave, MD  chlorpheniramine-HYDROcodone (TUSSIONEX) 10-8 MG/5ML LQCR Take 5 mLs by mouth every  12 (twelve) hours as needed for cough. 07/08/14   Kerri Perches, MD  levofloxacin (LEVAQUIN) 500 MG tablet Take 1 tablet (500 mg total) by mouth daily. 07/12/14   Brandy Octave, MD  predniSONE (DELTASONE) 50 MG tablet 1 tablet PO daily 07/12/14   Brandy Octave, MD   BP 141/87 mmHg  Pulse 68  Temp(Src) 98.4 F (36.9 C) (Oral)  Resp 16  Ht  (1.626 m)  Wt 297 lb (134.718 kg)  BMI 50.95 kg/m2  SpO2 94% Physical Exam  Constitutional: She is oriented to person, place, and time. She appears well-developed and well-nourished. No distress.  Morbidly obese.  HENT:  Head: Normocephalic and  atraumatic.  Mouth/Throat: Oropharynx is clear and moist. No oropharyngeal exudate.  Eyes: Conjunctivae and EOM are normal. Pupils are equal, round, and reactive to light.  Neck: Normal range of motion. Neck supple.  No meningismus.  Cardiovascular: Normal rate, regular rhythm, normal heart sounds and intact distal pulses.   No murmur heard. Pulmonary/Chest: Effort normal. No respiratory distress. She has wheezes.  Mild increased work of breathing. Decreased breath sounds with expiratory wheezing.   Abdominal: Soft. There is no tenderness. There is no rebound and no guarding.  Musculoskeletal: Normal range of motion. She exhibits no edema or tenderness.  No peripheral edema.   Neurological: She is alert and oriented to person, place, and time. No cranial nerve deficit. She exhibits normal muscle tone. Coordination normal.  No ataxia on finger to nose bilaterally. No pronator drift. 5/5 strength throughout. CN 2-12 intact. Negative Romberg. Equal grip strength. Sensation intact. Gait is normal.   Skin: Skin is warm.  Psychiatric: She has a normal mood and affect. Her behavior is normal.  Nursing note and vitals reviewed.   ED Course  Procedures (including critical care time) DIAGNOSTIC STUDIES: Oxygen Saturation is 94% on RA, normal by my interpretation.    COORDINATION OF CARE: 6:28 PM- Pt advised of plan for treatment which includes radiology and pt agrees.  Labs Review Labs Reviewed  COMPREHENSIVE METABOLIC PANEL - Abnormal; Notable for the following:    GFR calc non Af Amer 89 (*)    All other components within normal limits  D-DIMER, QUANTITATIVE - Abnormal; Notable for the following:    D-Dimer, Quant 0.53 (*)    All other components within normal limits  CBC WITH DIFFERENTIAL/PLATELET  TROPONIN I  BRAIN NATRIURETIC PEPTIDE  TROPONIN I    Imaging Review Dg Chest 2 View  07/12/2014   CLINICAL DATA:  Chest congestion, cough, chest tightness, shortness of breath, history  of asthma  EXAM: CHEST  2 VIEW  COMPARISON:  None.  FINDINGS: There is no focal parenchymal opacity, pleural effusion, or pneumothorax. The heart and mediastinal contours are unremarkable.  There are degenerative changes of bilateral acromioclavicular joints.  IMPRESSION: No active cardiopulmonary disease.   Electronically Signed   By: Elige Ko   On: 07/12/2014 19:08   Ct Angio Chest Pe W/cm &/or Wo Cm  07/12/2014   CLINICAL DATA:  Moderate intermittent cough starting about a week ago. Upper respiratory tract infection with chest congestion for about 10 days.  EXAM: CT ANGIOGRAPHY CHEST WITH CONTRAST  TECHNIQUE: Multidetector CT imaging of the chest was performed using the standard protocol during bolus administration of intravenous contrast. Multiplanar CT image reconstructions and MIPs were obtained to evaluate the vascular anatomy.  CONTRAST:  OMNIPAQUE IOHEXOL 350 MG/ML SOLN  COMPARISON:  06/18/2013  FINDINGS: There is moderately good opacification of the  central and segmental pulmonary arteries. Peripheral pulmonary arteries are not well identified. No filling defects are demonstrated in the visualized central pulmonary arteries, suggesting no evidence of significant pulmonary embolus.  Normal heart size. Normal caliber thoracic aorta. No evidence of aortic aneurysm or dissection. No significant lymphadenopathy in the chest. Esophagus is decompressed.  Evaluation of lungs is somewhat limited due to respiratory artifact. There appears to be linear scarring in the left lung base. Possible focal infiltration in the left mid lung. No pneumothorax. No pleural effusions. Airways appear patent.  Included portions of the upper abdominal organs demonstrate fatty infiltration of the liver and cholelithiasis. Degenerative changes in the spine. No destructive bone lesions.  Review of the MIP images confirms the above findings.  IMPRESSION: With no evidence of significant central pulmonary embolus although  limited contrast bolus may obscure visualization of small peripheral emboli. Suggestion of small focal area of infiltration in the left mid lung. Cholelithiasis and diffuse fatty infiltration of the liver.   Electronically Signed   By: Burman NievesWilliam  Stevens M.D.   On: 07/12/2014 21:52     EKG Interpretation   Date/Time:  Saturday July 12 2014 18:23:56 EST Ventricular Rate:  77 PR Interval:  162 QRS Duration: 104 QT Interval:  380 QTC Calculation: 430 R Axis:   19 Text Interpretation:  Sinus rhythm LVH with secondary repolarization  abnormality No significant change was found Confirmed by Manus GunningANCOUR  MD,  Yoseph Haile [id number]) on 07/12/2014 7:11:38 PM      MDM   Final diagnoses:  SOB (shortness of breath)  CAP (community acquired pneumonia)  Asthma attack   history of asthma presenting with 10 day history of cough, congestion, chest tightness with coughing. No fever. Morbidly obese with diminished breath sounds and scattered wheezing. She is given nebulizers and steroids.  Chest x-rays negative EKG nonischemic. D-dimer elevated.  CT shows no evidence of PE. There is a small area of possible pneumonia on the left lung. Patient is able to ambulate and maintain her saturations. She is feeling much better. Her lungs are clear. Troponin negative 2.  We'll treat for possible pneumonia and asthma exacerbation. Refill nebulizer. Follow-up with PCP. Return precautions discussed.  I personally performed the services described in this documentation, which was scribed in my presence. The recorded information has been reviewed and is accurate.    Brandy OctaveStephen Otie Headlee, MD 07/13/14 [id number]

## 2014-07-12 NOTE — ED Notes (Signed)
Pt. Ambulated in hallway. O2 sat remained 93% or greater. EDP notified.

## 2014-07-12 NOTE — Discharge Instructions (Signed)
Pneumonia Take the steroids and antibiotics as prescribed. Follow-up with your doctor. Return to the ED for new or worsening symptoms. Pneumonia is an infection of the lungs.  CAUSES Pneumonia may be caused by bacteria or a virus. Usually, these infections are caused by breathing infectious particles into the lungs (respiratory tract). SIGNS AND SYMPTOMS   Cough.  Fever.  Chest pain.  Increased rate of breathing.  Wheezing.  Mucus production. DIAGNOSIS  If you have the common symptoms of pneumonia, your health care provider will typically confirm the diagnosis with a chest X-ray. The X-ray will show an abnormality in the lung (pulmonary infiltrate) if you have pneumonia. Other tests of your blood, urine, or sputum may be done to find the specific cause of your pneumonia. Your health care provider may also do tests (blood gases or pulse oximetry) to see how well your lungs are working. TREATMENT  Some forms of pneumonia may be spread to other people when you cough or sneeze. You may be asked to wear a mask before and during your exam. Pneumonia that is caused by bacteria is treated with antibiotic medicine. Pneumonia that is caused by the influenza virus may be treated with an antiviral medicine. Most other viral infections must run their course. These infections will not respond to antibiotics.  HOME CARE INSTRUCTIONS   Cough suppressants may be used if you are losing too much rest. However, coughing protects you by clearing your lungs. You should avoid using cough suppressants if you can.  Your health care provider may have prescribed medicine if he or she thinks your pneumonia is caused by bacteria or influenza. Finish your medicine even if you start to feel better.  Your health care provider may also prescribe an expectorant. This loosens the mucus to be coughed up.  Take medicines only as directed by your health care provider.  Do not smoke. Smoking is a common cause of bronchitis  and can contribute to pneumonia. If you are a smoker and continue to smoke, your cough may last several weeks after your pneumonia has cleared.  A cold steam vaporizer or humidifier in your room or home may help loosen mucus.  Coughing is often worse at night. Sleeping in a semi-upright position in a recliner or using a couple pillows under your head will help with this.  Get rest as you feel it is needed. Your body will usually let you know when you need to rest. PREVENTION A pneumococcal shot (vaccine) is available to prevent a common bacterial cause of pneumonia. This is usually suggested for:  People over 114 years old.  Patients on chemotherapy.  People with chronic lung problems, such as bronchitis or emphysema.  People with immune system problems. If you are over 65 or have a high risk condition, you may receive the pneumococcal vaccine if you have not received it before. In some countries, a routine influenza vaccine is also recommended. This vaccine can help prevent some cases of pneumonia.You may be offered the influenza vaccine as part of your care. If you smoke, it is time to quit. You may receive instructions on how to stop smoking. Your health care provider can provide medicines and counseling to help you quit. SEEK MEDICAL CARE IF: You have a fever. SEEK IMMEDIATE MEDICAL CARE IF:   Your illness becomes worse. This is especially true if you are elderly or weakened from any other disease.  You cannot control your cough with suppressants and are losing sleep.  You begin coughing  up blood.  You develop pain which is getting worse or is uncontrolled with medicines.  Any of the symptoms which initially brought you in for treatment are getting worse rather than better.  You develop shortness of breath or chest pain. MAKE SURE YOU:   Understand these instructions.  Will watch your condition.  Will get help right away if you are not doing well or get worse. Document  Released: 05/30/2005 Document Revised: 10/14/2013 Document Reviewed: 08/19/2010 [facility] Patient Information 2015 [place], [place]. This information is not intended to replace advice given to you by your health care provider. Make sure you discuss any questions you have with your health care provider.

## 2014-07-12 NOTE — ED Notes (Signed)
Dr. Rancour at bedside. 

## 2014-07-14 ENCOUNTER — Other Ambulatory Visit: Payer: Self-pay | Admitting: Family Medicine

## 2014-07-16 ENCOUNTER — Ambulatory Visit (INDEPENDENT_AMBULATORY_CARE_PROVIDER_SITE_OTHER): Payer: BC Managed Care – PPO | Admitting: Family Medicine

## 2014-07-16 ENCOUNTER — Encounter: Payer: Self-pay | Admitting: Family Medicine

## 2014-07-16 VITALS — BP 132/76 | HR 64 | Resp 18 | Ht 63.0 in | Wt 309.1 lb

## 2014-07-16 DIAGNOSIS — Z6841 Body Mass Index (BMI) 40.0 and over, adult: Secondary | ICD-10-CM

## 2014-07-16 DIAGNOSIS — Z9119 Patient's noncompliance with other medical treatment and regimen: Secondary | ICD-10-CM

## 2014-07-16 DIAGNOSIS — J45991 Cough variant asthma: Secondary | ICD-10-CM

## 2014-07-16 DIAGNOSIS — Z91199 Patient's noncompliance with other medical treatment and regimen due to unspecified reason: Secondary | ICD-10-CM

## 2014-07-16 DIAGNOSIS — I10 Essential (primary) hypertension: Secondary | ICD-10-CM

## 2014-07-16 DIAGNOSIS — R7303 Prediabetes: Secondary | ICD-10-CM

## 2014-07-16 DIAGNOSIS — E785 Hyperlipidemia, unspecified: Secondary | ICD-10-CM

## 2014-07-16 DIAGNOSIS — R7309 Other abnormal glucose: Secondary | ICD-10-CM

## 2014-07-16 DIAGNOSIS — E8881 Metabolic syndrome: Secondary | ICD-10-CM

## 2014-07-16 MED ORDER — BUDESONIDE-FORMOTEROL FUMARATE 80-4.5 MCG/ACT IN AERO: 2.0000 | INHALATION_SPRAY | Freq: Two times a day (BID) | RESPIRATORY_TRACT | Status: DC

## 2014-07-16 MED ORDER — ALBUTEROL SULFATE 1.25 MG/3ML IN NEBU: 1.0000 | INHALATION_SOLUTION | Freq: Four times a day (QID) | RESPIRATORY_TRACT | Status: DC | PRN

## 2014-07-16 MED ORDER — TRIAMTERENE-HCTZ 75-50 MG PO TABS: 1.0000 | ORAL_TABLET | Freq: Every day | ORAL | Status: DC

## 2014-07-16 MED ORDER — ALBUTEROL SULFATE HFA 108 (90 BASE) MCG/ACT IN AERS: 2.0000 | INHALATION_SPRAY | Freq: Four times a day (QID) | RESPIRATORY_TRACT | Status: DC | PRN

## 2014-07-16 NOTE — Patient Instructions (Signed)
F/u with pap in 3.5 month, call if you need me before  Pls get a peak flow meter and start using daily to see how your asthma is doing  Take the entire course of antibiotic  Take the prednisone dose pack you were prescribed in the ED  Labs will be added to what was drawn in the ED and you will be contacted if abnormal  Please make and schedule your appt to see Dr Darrick PennaFields for colonoscopy ,  You were referred last year, so pls call the office

## 2014-07-16 NOTE — Progress Notes (Signed)
   Subjective:    Patient ID: Brandy Dean, female    DOB: 04/29/1951, 64 y.o.   MRN: [id number]  HPI Pt in for ED f/u recently presented with asthma flare and possible pneumonia. Currently on antibiotic course and reports symptoms improvement States she had productive cough fpor 1 week prior to either going to Ed or calling for appt. Educated re the need to be proactive where lung disease is concerned as she is asthmatic No weight loss, unfortunately weight gain Still needs her colonoscopy   Review of Systems See HPI Denies sinus pressure, nasal congestion, ear pain or sore throat.  Denies chest pains, palpitations and leg swelling Denies abdominal pain, nausea, vomiting,diarrhea or constipation.   Denies dysuria, frequency, hesitancy or incontinence. Denies joint pain, swelling and limitation in mobility. Denies headaches, seizures, numbness, or tingling. Denies depression, anxiety or insomnia. Denies skin break down or rash.        Objective:   Physical Exam BP 132/76 mmHg  Pulse 64  Resp 18  Ht 5\' 3"  (1.6 m)  Wt 309 lb 1.9 oz (140.216 kg)  BMI 54.77 kg/m2  SpO2 92%  Patient alert and oriented and in no cardiopulmonary distress.  HEENT: No facial asymmetry, EOMI,   oropharynx pink and moist.  Neck supple no JVD, no mass. No sinus tenderness, TM clear Chest: Decreased though adequate air entry, left mid lung crackles,  No wheeze CVS: S1, S2 no murmurs, no S3.Regular rate.  ABD: Soft non tender.   Ext: No edema  MS: Adequate ROM spine, shoulders, hips and knees.  Skin: Intact, no ulcerations or rash noted.  Psych: Good eye contact, normal affect. Memory intact not anxious or depressed appearing.  CNS: CN 2-12 intact, power,  normal throughout.no focal deficits noted.       Assessment & Plan:  Asthma, cough variant Uncontrolled, recently in Ed with acute flare. Re educated re need for daily spirometry, and that the warning symptom of uncontrolled asthma  is excessive cough Medications sent to her pharmacy, she reported being out of some of her maintenance mication   Morbid obesity with BMI of 50.0-59.9, adult Deteriorated. Patient re-educated about  the importance of commitment to a  minimum of 150 minutes of exercise per week. The importance of healthy food choices with portion control discussed. Encouraged to start a food diary, count calories and to consider  joining a support group. Sample diet sheets offered. Goals set by the patient for the next several months.      Metabolic syndrome The increased risk of cardiovascular disease associated with this diagnosis, and the need to consistently work on lifestyle to change this is discussed. Following  a  heart healthy diet ,commitment to 30 minutes of exercise at least 5 days per week, as well as control of blood sugar and cholesterol , and achieving a healthy weight are all the areas to be addressed .    Medically noncompliant Colonoscopy still outstanding, reminded of the need to have this   Prediabetes Patient educated about the importance of limiting  Carbohydrate intake , the need to commit to daily physical activity for a minimum of 30 minutes , and to commit weight loss. The fact that changes in all these areas will reduce or eliminate all together the development of diabetes is stressed.      Hyperlipidemia with target LDL less than 100 Essentially unchanged Hyperlipidemia:Low fat diet discussed and encouraged.

## 2014-07-18 LAB — LIPID PANEL
CHOLESTEROL: 206 mg/dL — AB (ref 0–200)
HDL: 64 mg/dL (ref >39)
LDL Cholesterol: 126 mg/dL — ABNORMAL HIGH (ref 0–99)
Total CHOL/HDL Ratio: 3.2 Ratio
Triglycerides: 82 mg/dL (ref <150)
VLDL: 16 mg/dL (ref 0–40)

## 2014-07-18 LAB — TSH: TSH: 1.513 u[IU]/mL (ref 0.350–4.500)

## 2014-07-18 LAB — VITAMIN D 25 HYDROXY (VIT D DEFICIENCY, FRACTURES): VIT D 25 HYDROXY: 7 ng/mL — AB (ref 30–100)

## 2014-07-18 LAB — HEMOGLOBIN A1C
HEMOGLOBIN A1C: 6 % — AB (ref <5.7)
Mean Plasma Glucose: 126 mg/dL — ABNORMAL HIGH (ref <117)

## 2014-07-19 DIAGNOSIS — K8020 Calculus of gallbladder without cholecystitis without obstruction: Secondary | ICD-10-CM | POA: Insufficient documentation

## 2014-07-19 NOTE — Assessment & Plan Note (Signed)
Essentially unchanged Hyperlipidemia:Low fat diet discussed and encouraged.

## 2014-07-19 NOTE — Assessment & Plan Note (Signed)
Uncontrolled, recently in Ed with acute flare. Re educated re need for daily spirometry, and that the warning symptom of uncontrolled asthma is excessive cough Medications sent to her pharmacy, she reported being out of some of her maintenance mication

## 2014-07-19 NOTE — Assessment & Plan Note (Signed)
Colonoscopy still outstanding, reminded of the need to have this

## 2014-07-19 NOTE — Assessment & Plan Note (Signed)
The increased risk of cardiovascular disease associated with this diagnosis, and the need to consistently work on lifestyle to change this is discussed. Following  a  heart healthy diet ,commitment to 30 minutes of exercise at least 5 days per week, as well as control of blood sugar and cholesterol , and achieving a healthy weight are all the areas to be addressed .  

## 2014-07-19 NOTE — Assessment & Plan Note (Signed)
Patient educated about the importance of limiting  Carbohydrate intake , the need to commit to daily physical activity for a minimum of 30 minutes , and to commit weight loss. The fact that changes in all these areas will reduce or eliminate all together the development of diabetes is stressed.    

## 2014-07-19 NOTE — Assessment & Plan Note (Signed)
Deteriorated. Patient re-educated about  the importance of commitment to a  minimum of 150 minutes of exercise per week. The importance of healthy food choices with portion control discussed. Encouraged to start a food diary, count calories and to consider  joining a support group. Sample diet sheets offered. Goals set by the patient for the next several months.    

## 2014-08-07 MED ORDER — VITAMIN D (ERGOCALCIFEROL) 1.25 MG (50000 UNIT) PO CAPS: 50000.0000 [IU] | ORAL_CAPSULE | ORAL | Status: DC

## 2014-08-07 NOTE — Addendum Note (Signed)
Addended by: Abner GreenspanHUDY, Kameelah Minish H on: 08/07/2014 10:56 AM   Modules accepted: Orders

## 2014-09-16 ENCOUNTER — Encounter: Payer: BC Managed Care – PPO | Admitting: Family Medicine

## 2014-11-07 ENCOUNTER — Other Ambulatory Visit: Payer: Self-pay | Admitting: Family Medicine

## 2014-12-02 ENCOUNTER — Encounter: Payer: Self-pay | Admitting: Family Medicine

## 2014-12-02 ENCOUNTER — Encounter: Payer: Self-pay | Admitting: *Deleted

## 2014-12-02 ENCOUNTER — Encounter: Payer: BC Managed Care – PPO | Admitting: Family Medicine

## 2015-03-04 ENCOUNTER — Ambulatory Visit (INDEPENDENT_AMBULATORY_CARE_PROVIDER_SITE_OTHER): Payer: BC Managed Care – PPO | Admitting: Family Medicine

## 2015-03-04 ENCOUNTER — Encounter: Payer: Self-pay | Admitting: Family Medicine

## 2015-03-04 VITALS — BP 120/80 | HR 58 | Resp 16 | Ht 63.0 in | Wt 317.0 lb

## 2015-03-04 DIAGNOSIS — R7309 Other abnormal glucose: Secondary | ICD-10-CM | POA: Diagnosis not present

## 2015-03-04 DIAGNOSIS — E8881 Metabolic syndrome: Secondary | ICD-10-CM

## 2015-03-04 DIAGNOSIS — E785 Hyperlipidemia, unspecified: Secondary | ICD-10-CM

## 2015-03-04 DIAGNOSIS — Z91199 Patient's noncompliance with other medical treatment and regimen due to unspecified reason: Secondary | ICD-10-CM

## 2015-03-04 DIAGNOSIS — R87810 Cervical high risk human papillomavirus (HPV) DNA test positive: Secondary | ICD-10-CM

## 2015-03-04 DIAGNOSIS — I10 Essential (primary) hypertension: Secondary | ICD-10-CM

## 2015-03-04 DIAGNOSIS — Z114 Encounter for screening for human immunodeficiency virus [HIV]: Secondary | ICD-10-CM | POA: Diagnosis not present

## 2015-03-04 DIAGNOSIS — R7303 Prediabetes: Secondary | ICD-10-CM

## 2015-03-04 DIAGNOSIS — E559 Vitamin D deficiency, unspecified: Secondary | ICD-10-CM | POA: Diagnosis not present

## 2015-03-04 DIAGNOSIS — Z9119 Patient's noncompliance with other medical treatment and regimen: Secondary | ICD-10-CM

## 2015-03-04 DIAGNOSIS — Z6841 Body Mass Index (BMI) 40.0 and over, adult: Secondary | ICD-10-CM

## 2015-03-04 DIAGNOSIS — Z1159 Encounter for screening for other viral diseases: Secondary | ICD-10-CM

## 2015-03-04 DIAGNOSIS — J45991 Cough variant asthma: Secondary | ICD-10-CM

## 2015-03-04 MED ORDER — HYDROCOD POLST-CPM POLST ER 10-8 MG/5ML PO SUER: 5.0000 mL | Freq: Two times a day (BID) | ORAL | Status: DC | PRN

## 2015-03-04 NOTE — Progress Notes (Signed)
Subjective:    Patient ID: Brandy Dean, female    DOB: [birth year], 64 y.o.   MRN: [id number]  HPI   Brandy Dean     MRN: [id number]      DOB: November 14, 1950   HPI Brandy Dean is here for follow up and re-evaluation of chronic medical conditions, medication management and review of any available recent lab and radiology data.  Preventive health is updated, specifically  Cancer screening and Immunization.   Questions or concerns regarding consultations or procedures which the PT has had in the interim are  addressed. The PT denies any adverse reactions to current medications since the last visit.  There are no new concerns.  There are no specific complaints   ROS Denies recent fever or chills. Denies sinus pressure, nasal congestion, ear pain or sore throat. Denies chest congestion, productive cough or wheezing. Denies chest pains, palpitations and leg swelling Denies abdominal pain, nausea, vomiting,diarrhea or constipation.   Denies dysuria, frequency, hesitancy or incontinence. Denies joint pain, swelling and limitation in mobility. Denies headaches, seizures, numbness, or tingling. Denies depression, anxiety or insomnia. Denies skin break down or rash.   PE  BP 120/80 mmHg  Pulse 58  Resp 16  Ht  (1.6 m)  Wt 317 lb (143.79 kg)  BMI 56.17 kg/m2  SpO2 92%  Patient alert and oriented and in no cardiopulmonary distress.  HEENT: No facial asymmetry, EOMI,   oropharynx pink and moist.  Neck supple no JVD, no mass.  Chest: Clear to auscultation bilaterally.  CVS: S1, S2 no murmurs, no S3.Regular rate.  ABD: Soft non tender.   Ext: No edema  MS: Adequate ROM spine, shoulders, hips and knees.  Skin: Intact, no ulcerations or rash noted.  Psych: Good eye contact, normal affect. Memory intact not anxious or depressed appearing.  CNS: CN 2-12 intact, power,  normal throughout.no focal deficits noted.   Assessment & Plan  Essential hypertension Controlled, no  change in medication DASH diet and commitment to daily physical activity for a minimum of 30 minutes discussed and encouraged, as a part of hypertension management. The importance of attaining a healthy weight is also discussed.  BP/Weight 03/04/2015 07/16/2014 07/12/2014 05/06/2014 09/02/2013 06/20/2013 06/12/2013  Systolic BP 120 132 133 146 132 142 134  Diastolic BP 80 76 103 82 80 59 82  Wt. (Lbs) 317 309.12 297 320 305.12 - 303  BMI 56.17 54.77 50.95 56.7 54.06 - 53.69        Asthma, cough variant Controlled, no change in medication   Medically noncompliant Colonoscopy and repeat pap still outstanding, also refuses flu vaccine  Cervical high risk HPV (human papillomavirus) test positive Reschedule for repeat pap, pt aware of importance of getting the test done , states she will return , had missed appt previously scheduled  Prediabetes Patient educated about the importance of limiting  Carbohydrate intake , the need to commit to daily physical activity for a minimum of 30 minutes , and to commit weight loss. The fact that changes in all these areas will reduce or eliminate all together the development of diabetes is stressed.  Unchanged  Diabetic Labs Latest Ref Rng 03/04/2015 07/16/2014 07/12/2014 06/11/2013 03/06/2013  HbA1c <5.7 % 6.0(H) 6.0(H) - 6.1(H) 6.1(H)  Chol 0 - 200 mg/dL - 086(V) - 784 696(E)  HDL >39 mg/dL - 64 - 43 65  Calc LDL 0 - 99 mg/dL - 952(W) - 413(K) 440(N)  Triglycerides <150 mg/dL - 82 - 027 89  Creatinine 0.50 - 0.99 mg/dL 4.09 - 8.11 9.14 7.82   BP/Weight 03/04/2015 07/16/2014 07/12/2014 05/06/2014 09/02/2013 06/20/2013 06/12/2013  Systolic BP 120 132 133 146 132 142 134  Diastolic BP 80 76 103 82 80 59 82  Wt. (Lbs) 317 309.12 297 320 305.12 - 303  BMI 56.17 54.77 50.95 56.7 54.06 - 53.69   No flowsheet data found.     Hyperlipidemia with target LDL less than 100 Hyperlipidemia:Low fat diet discussed and encouraged.   Lipid Panel  Lab Results    Component Value Date   CHOL 206* 07/16/2014   HDL 64 07/16/2014   LDLCALC 126* 07/16/2014   TRIG 82 07/16/2014   CHOLHDL 3.2 07/16/2014   Updated lab needed at/ before next visit.      Morbid obesity with BMI of 50.0-59.9, adult Deteriorated. Patient re-educated about  the importance of commitment to a  minimum of 150 minutes of exercise per week.  The importance of healthy food choices with portion control discussed. Encouraged to start a food diary, count calories and to consider  joining a support group. Sample diet sheets offered. Goals set by the patient for the next several months.   Weight /BMI 03/04/2015 07/16/2014 07/12/2014  WEIGHT 317 lb 309 lb 1.9 oz 297 lb  HEIGHT     BMI 56.17 kg/m2 54.77 kg/m2 50.95 kg/m2    Current exercise per week 30 minutes.   Metabolic syndrome The increased risk of cardiovascular disease associated with this diagnosis, and the need to consistently work on lifestyle to change this is discussed. Following  a  heart healthy diet ,commitment to 30 minutes of exercise at least 5 days per week, as well as control of blood sugar and cholesterol , and achieving a healthy weight are all the areas to be addressed .       Review of Systems     Objective:   Physical Exam        Assessment & Plan:

## 2015-03-04 NOTE — Patient Instructions (Addendum)
Pap and rectal exam in November, must keep appt as we discussed very important.   Labs today     Schedule and do mammogram please   Need colonscopy  Call number  It is important that you exercise regularly at least 30 minutes 5 times a week. If you develop chest pain, have severe difficulty breathing, or feel very tired, stop exercising immediately and seek medical attention   A healthy diet is rich in fruit, vegetables and whole grains. Poultry fish, nuts and beans are a healthy choice for protein rather then red meat. A low sodium diet and drinking 64 ounces of water daily is generally recommended. Oils and sweet should be limited. Carbohydrates especially for those who are diabetic or overweight, should be limited to 34-45 gram per meal. It is important to eat on a regular schedule, at least 3 times daily. Snacks should be primarily fruits, vegetables or nuts.   Thanks for choosing [facility], we consider it a privelige to serve you.

## 2015-03-05 ENCOUNTER — Other Ambulatory Visit: Payer: Self-pay

## 2015-03-05 LAB — BASIC METABOLIC PANEL
BUN: 18 mg/dL (ref 7–25)
CO2: 31 mmol/L (ref 20–31)
Calcium: 10.1 mg/dL (ref 8.6–10.4)
Chloride: 100 mmol/L (ref 98–110)
Creat: 0.8 mg/dL (ref 0.50–0.99)
Glucose, Bld: 83 mg/dL (ref 65–99)
Potassium: 4.1 mmol/L (ref 3.5–5.3)
Sodium: 139 mmol/L (ref 135–146)

## 2015-03-05 LAB — HEMOGLOBIN A1C
HEMOGLOBIN A1C: 6 % — AB (ref <5.7)
Mean Plasma Glucose: 126 mg/dL — ABNORMAL HIGH (ref <117)

## 2015-03-05 LAB — HEPATITIS C ANTIBODY: HCV AB: NEGATIVE

## 2015-03-05 LAB — HIV ANTIBODY (ROUTINE TESTING W REFLEX): HIV 1&2 Ab, 4th Generation: NONREACTIVE

## 2015-03-05 LAB — VITAMIN D 25 HYDROXY (VIT D DEFICIENCY, FRACTURES): Vit D, 25-Hydroxy: 12 ng/mL — ABNORMAL LOW (ref 30–100)

## 2015-03-05 MED ORDER — VITAMIN D (ERGOCALCIFEROL) 1.25 MG (50000 UNIT) PO CAPS: 50000.0000 [IU] | ORAL_CAPSULE | ORAL | Status: DC

## 2015-03-07 NOTE — Assessment & Plan Note (Signed)
Controlled, no change in medication  

## 2015-03-07 NOTE — Assessment & Plan Note (Signed)
Patient educated about the importance of limiting  Carbohydrate intake , the need to commit to daily physical activity for a minimum of 30 minutes , and to commit weight loss. The fact that changes in all these areas will reduce or eliminate all together the development of diabetes is stressed.  Unchanged  Diabetic Labs Latest Ref Rng 03/04/2015 07/16/2014 07/12/2014 06/11/2013 03/06/2013  HbA1c <5.7 % 6.0(H) 6.0(H) - 6.1(H) 6.1(H)  Chol 0 - 200 mg/dL - 161(W) - 960 454(U)  HDL >39 mg/dL - 64 - 43 65  Calc LDL 0 - 99 mg/dL - 981(X) - 914(N) 829(F)  Triglycerides <150 mg/dL - 82 - 621 89  Creatinine 0.50 - 0.99 mg/dL 3.08 - 6.57 8.46 9.62   BP/Weight 03/04/2015 07/16/2014 07/12/2014 05/06/2014 09/02/2013 06/20/2013 06/12/2013  Systolic BP 120 132 133 146 132 142 134  Diastolic BP 80 76 103 82 80 59 82  Wt. (Lbs) 317 309.12 297 320 305.12 - 303  BMI 56.17 54.77 50.95 56.7 54.06 - 53.69   No flowsheet data found.

## 2015-03-07 NOTE — Assessment & Plan Note (Signed)
Hyperlipidemia:Low fat diet discussed and encouraged.   Lipid Panel  Lab Results  Component Value Date   CHOL 206* 07/16/2014   HDL 64 07/16/2014   LDLCALC 126* 07/16/2014   TRIG 82 07/16/2014   CHOLHDL 3.2 07/16/2014   Updated lab needed at/ before next visit.     

## 2015-03-07 NOTE — Assessment & Plan Note (Signed)
The increased risk of cardiovascular disease associated with this diagnosis, and the need to consistently work on lifestyle to change this is discussed. Following  a  heart healthy diet ,commitment to 30 minutes of exercise at least 5 days per week, as well as control of blood sugar and cholesterol , and achieving a healthy weight are all the areas to be addressed .  

## 2015-03-07 NOTE — Assessment & Plan Note (Signed)
Colonoscopy and repeat pap still outstanding, also refuses flu vaccine

## 2015-03-07 NOTE — Assessment & Plan Note (Signed)
Reschedule for repeat pap, pt aware of importance of getting the test done , states she will return , had missed appt previously scheduled

## 2015-03-07 NOTE — Assessment & Plan Note (Signed)
Deteriorated. Patient re-educated about  the importance of commitment to a  minimum of 150 minutes of exercise per week.  The importance of healthy food choices with portion control discussed. Encouraged to start a food diary, count calories and to consider  joining a support group. Sample diet sheets offered. Goals set by the patient for the next several months.   Weight /BMI 03/04/2015 07/16/2014 07/12/2014  WEIGHT 317 lb 309 lb 1.9 oz 297 lb  HEIGHT     BMI 56.17 kg/m2 54.77 kg/m2 50.95 kg/m2    Current exercise per week 30 minutes.

## 2015-03-07 NOTE — Assessment & Plan Note (Signed)
Controlled, no change in medication DASH diet and commitment to daily physical activity for a minimum of 30 minutes discussed and encouraged, as a part of hypertension management. The importance of attaining a healthy weight is also discussed.  BP/Weight 03/04/2015 07/16/2014 07/12/2014 05/06/2014 09/02/2013 06/20/2013 06/12/2013  Systolic BP 120 132 133 146 132 142 134  Diastolic BP 80 76 103 82 80 59 82  Wt. (Lbs) 317 309.12 297 320 305.12 - 303  BMI 56.17 54.77 50.95 56.7 54.06 - 53.69

## 2015-03-11 ENCOUNTER — Other Ambulatory Visit: Payer: Self-pay | Admitting: Family Medicine

## 2015-03-12 ENCOUNTER — Telehealth: Payer: Self-pay | Admitting: *Deleted

## 2015-03-12 DIAGNOSIS — I10 Essential (primary) hypertension: Secondary | ICD-10-CM

## 2015-03-12 MED ORDER — TRIAMTERENE-HCTZ 75-50 MG PO TABS: 1.0000 | ORAL_TABLET | Freq: Every day | ORAL | Status: DC

## 2015-03-12 NOTE — Telephone Encounter (Signed)
Patient called stating she needs her blood pressure medication sent in to [facility] in [place].

## 2015-03-12 NOTE — Telephone Encounter (Signed)
Med sent.

## 2015-04-30 ENCOUNTER — Encounter: Payer: Self-pay | Admitting: Family Medicine

## 2015-04-30 ENCOUNTER — Ambulatory Visit (INDEPENDENT_AMBULATORY_CARE_PROVIDER_SITE_OTHER): Payer: BC Managed Care – PPO | Admitting: Family Medicine

## 2015-04-30 ENCOUNTER — Other Ambulatory Visit (HOSPITAL_COMMUNITY)
Admission: RE | Admit: 2015-04-30 | Discharge: 2015-04-30 | Disposition: A | Discharge status: Home / Self Care | Payer: BC Managed Care – PPO | Source: Ambulatory Visit | Attending: Family Medicine | Admitting: Family Medicine

## 2015-04-30 VITALS — BP 120/80 | HR 77 | Resp 16 | Ht 63.0 in | Wt 319.0 lb

## 2015-04-30 DIAGNOSIS — Z01419 Encounter for gynecological examination (general) (routine) without abnormal findings: Secondary | ICD-10-CM | POA: Insufficient documentation

## 2015-04-30 DIAGNOSIS — Z1211 Encounter for screening for malignant neoplasm of colon: Secondary | ICD-10-CM

## 2015-04-30 DIAGNOSIS — E559 Vitamin D deficiency, unspecified: Secondary | ICD-10-CM

## 2015-04-30 DIAGNOSIS — R87810 Cervical high risk human papillomavirus (HPV) DNA test positive: Secondary | ICD-10-CM

## 2015-04-30 DIAGNOSIS — I10 Essential (primary) hypertension: Secondary | ICD-10-CM | POA: Diagnosis not present

## 2015-04-30 DIAGNOSIS — Z124 Encounter for screening for malignant neoplasm of cervix: Secondary | ICD-10-CM

## 2015-04-30 DIAGNOSIS — R7303 Prediabetes: Secondary | ICD-10-CM | POA: Diagnosis not present

## 2015-04-30 DIAGNOSIS — Z2839 Other underimmunization status: Secondary | ICD-10-CM

## 2015-04-30 DIAGNOSIS — E785 Hyperlipidemia, unspecified: Secondary | ICD-10-CM | POA: Diagnosis not present

## 2015-04-30 DIAGNOSIS — Z283 Underimmunization status: Secondary | ICD-10-CM

## 2015-04-30 DIAGNOSIS — Z6841 Body Mass Index (BMI) 40.0 and over, adult: Secondary | ICD-10-CM

## 2015-04-30 LAB — POC HEMOCCULT BLD/STL (OFFICE/1-CARD/DIAGNOSTIC): Fecal Occult Blood, POC: NEGATIVE

## 2015-04-30 MED ORDER — HYDROCOD POLST-CPM POLST ER 10-8 MG/5ML PO SUER: 5.0000 mL | Freq: Two times a day (BID) | ORAL | Status: DC | PRN

## 2015-04-30 MED ORDER — VITAMIN D (ERGOCALCIFEROL) 1.25 MG (50000 UNIT) PO CAPS: 50000.0000 [IU] | ORAL_CAPSULE | ORAL | Status: DC

## 2015-04-30 MED ORDER — TRIAMTERENE-HCTZ 75-50 MG PO TABS: 1.0000 | ORAL_TABLET | Freq: Every day | ORAL | Status: DC

## 2015-04-30 MED ORDER — BUDESONIDE-FORMOTEROL FUMARATE 80-4.5 MCG/ACT IN AERO
2.0000 | INHALATION_SPRAY | Freq: Two times a day (BID) | RESPIRATORY_TRACT | Status: DC
Start: 2015-04-30 — End: 2015-07-16

## 2015-04-30 MED ORDER — ALBUTEROL SULFATE HFA 108 (90 BASE) MCG/ACT IN AERS: 2.0000 | INHALATION_SPRAY | Freq: Four times a day (QID) | RESPIRATORY_TRACT | Status: DC | PRN

## 2015-04-30 MED ORDER — ALBUTEROL SULFATE 1.25 MG/3ML IN NEBU: 1.0000 | INHALATION_SOLUTION | Freq: Four times a day (QID) | RESPIRATORY_TRACT | Status: DC | PRN

## 2015-04-30 NOTE — Patient Instructions (Addendum)
F/u end March,  Call if you need me sooner  Medications are refilled  Pls come back for flu vaccine  Fasting lipid, cmp, hBA1C, TSH, Vit D CBC  1 week before follow up  Please work on good  health habits so that your health will improve. 1. Commitment to daily physical activity for 30 to 60  minutes, if you are able to do this.  2. Commitment to wise food choices. Aim for half of your  food intake to be vegetable and fruit, one quarter starchy foods, and one quarter protein. Try to eat on a regular schedule  3 meals per day, snacking between meals should be limited to vegetables or fruits or small portions of nuts. 64 ounces of water per day is generally recommended, unless you have specific health conditions, like heart failure or kidney failure where you will need to limit fluid intake.  3. Commitment to sufficient and a  good quality of physical and mental rest daily, generally between 6 to 8 hours per day.  WITH PERSISTANCE AND PERSEVERANCE, THE IMPOSSIBLE , BECOMES THE NORM!  [facility], we consider it a privelige to serve you. Happy NEW YEAR!

## 2015-04-30 NOTE — Progress Notes (Signed)
Subjective:    Patient ID: Brandy Dean, female    DOB: [birth year], 64 y.o.   MRN: [id number]  HPI   Brandy Dean     MRN: [id number]      DOB: 1951/04/28   HPI Brandy Dean is here for follow up and re-evaluation of chronic medical conditions, medication management and review of any available recent lab and radiology data.  Preventive health is updated, specifically  Cancer screening and Immunization.   Questions or concerns regarding consultations or procedures which the PT has had in the interim are  addressed. The PT denies any adverse reactions to current medications since the last visit.  There are no new concerns.  There are no specific complaints   ROS Denies recent fever or chills. Denies sinus pressure, nasal congestion, ear pain or sore throat. Denies chest congestion, productive cough or wheezing. Denies chest pains, palpitations and leg swelling Denies abdominal pain, nausea, vomiting,diarrhea or constipation.   Denies dysuria, frequency, hesitancy or incontinence. Denies joint pain, swelling and limitation in mobility. Denies headaches, seizures, numbness, or tingling. Denies depression, anxiety or insomnia. Denies skin break down or rash.   PE  BP 120/80 mmHg  Pulse 77  Resp 16  Ht  (1.6 m)  Wt 319 lb (144.697 kg)  BMI 56.52 kg/m2  SpO2 95%  Patient alert and oriented and in no cardiopulmonary distress.  HEENT: No facial asymmetry, EOMI,   oropharynx pink and moist.  Neck supple no JVD, no mass.  Chest: Clear to auscultation bilaterally.  CVS: S1, S2 no murmurs, no S3.Regular rate.  ABD: Soft non tender.  Rectal: no mass palpable. Heme negative stool. Pelvic: vaginal walls , vulva and introitus appear normal. Physiologic discharge present. Unable to appreciate organ size because of body habitus No cervical motion or adnexal tenderness. Cervix appear healthy and normal on inspection  Ext: No edema  MS: Adequate though educed  ROM spine,  shoulders, hips and knees.  Skin: Intact, no ulcerations or rash noted.  Psych: Good eye contact, normal affect. Memory intact not anxious or depressed appearing.  CNS: CN 2-12 intact, power,  normal throughout.no focal deficits noted.   Assessment & Plan   Essential hypertension Controlled, no change in medication DASH diet and commitment to daily physical activity for a minimum of 30 minutes discussed and encouraged, as a part of hypertension management. The importance of attaining a healthy weight is also discussed.  BP/Weight 04/30/2015 03/04/2015 07/16/2014 07/12/2014 05/06/2014 09/02/2013 06/20/2013  Systolic BP 120 120 132 133 146 132 142  Diastolic BP 80 80 76 103 82 80 59  Wt. (Lbs) 319 317 309.12 297 320 305.12 -  BMI 56.52 56.17 54.77 50.95 56.7 54.06 -        Morbid obesity with BMI of 50.0-59.9, adult Deteriorated. Patient re-educated about  the importance of commitment to a  minimum of 150 minutes of exercise per week.  The importance of healthy food choices with portion control discussed. Encouraged to start a food diary, count calories and to consider  joining a support group. Sample diet sheets offered. Goals set by the patient for the next several months.   Weight /BMI 04/30/2015 03/04/2015 07/16/2014  WEIGHT 319 lb 317 lb 309 lb 1.9 oz  HEIGHT     BMI 56.52 kg/m2 56.17 kg/m2 54.77 kg/m2    Current exercise per week 90 minutes.   Prediabetes Patient educated about the importance of limiting  Carbohydrate intake , the need  to commit to daily physical activity for a minimum of 30 minutes , and to commit weight loss. The fact that changes in all these areas will reduce or eliminate all together the development of diabetes is stressed.  Unchanged Updated lab needed at/ before next visit.   Diabetic Labs Latest Ref Rng 03/04/2015 07/16/2014 07/12/2014 06/11/2013 03/06/2013  HbA1c <5.7 % 6.0(H) 6.0(H) - 6.1(H) 6.1(H)  Chol 0 - 200 mg/dL - 621(H206(H) - 086198  578(I206(H)  HDL >39 mg/dL - 64 - 43 65  Calc LDL 0 - 99 mg/dL - 696(E126(H) - 952(W135(H) 413(K123(H)  Triglycerides <150 mg/dL - 82 - 440101 89  Creatinine 0.50 - 0.99 mg/dL 1.020.80 - 7.250.74 3.660.74 4.400.68   BP/Weight 04/30/2015 03/04/2015 07/16/2014 07/12/2014 05/06/2014 09/02/2013 06/20/2013  Systolic BP 120 120 132 133 146 132 142  Diastolic BP 80 80 76 103 82 80 59  Wt. (Lbs) 319 317 309.12 297 320 305.12 -  BMI 56.52 56.17 54.77 50.95 56.7 54.06 -   No flowsheet data found.      Hyperlipidemia with target LDL less than 100 Hyperlipidemia:Low fat diet discussed and encouraged.   Lipid Panel  Lab Results  Component Value Date   CHOL 206* 07/16/2014   HDL 64 07/16/2014   LDLCALC 126* 07/16/2014   TRIG 82 07/16/2014   CHOLHDL 3.2 07/16/2014   Updated lab needed at/ before next visit.      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

## 2015-05-01 NOTE — Assessment & Plan Note (Signed)
Patient educated about the importance of limiting  Carbohydrate intake , the need to commit to daily physical activity for a minimum of 30 minutes , and to commit weight loss. The fact that changes in all these areas will reduce or eliminate all together the development of diabetes is stressed.  Unchanged Updated lab needed at/ before next visit.   Diabetic Labs Latest Ref Rng 03/04/2015 07/16/2014 07/12/2014 06/11/2013 03/06/2013  HbA1c <5.7 % 6.0(H) 6.0(H) - 6.1(H) 6.1(H)  Chol 0 - 200 mg/dL - 161(W206(H) - 960198 454(U206(H)  HDL >39 mg/dL - 64 - 43 65  Calc LDL 0 - 99 mg/dL - 981(X126(H) - 914(N135(H) 829(F123(H)  Triglycerides <150 mg/dL - 82 - 621101 89  Creatinine 0.50 - 0.99 mg/dL 3.080.80 - 6.570.74 8.460.74 9.620.68   BP/Weight 04/30/2015 03/04/2015 07/16/2014 07/12/2014 05/06/2014 09/02/2013 06/20/2013  Systolic BP 120 120 132 133 146 132 142  Diastolic BP 80 80 76 103 82 80 59  Wt. (Lbs) 319 317 309.12 297 320 305.12 -  BMI 56.52 56.17 54.77 50.95 56.7 54.06 -   No flowsheet data found.

## 2015-05-01 NOTE — Assessment & Plan Note (Signed)
Refused influenza vaccine at this visit

## 2015-05-01 NOTE — Assessment & Plan Note (Signed)
Hyperlipidemia:Low fat diet discussed and encouraged.   Lipid Panel  Lab Results  Component Value Date   CHOL 206* 07/16/2014   HDL 64 07/16/2014   LDLCALC 126* 07/16/2014   TRIG 82 07/16/2014   CHOLHDL 3.2 07/16/2014   Updated lab needed at/ before next visit.

## 2015-05-01 NOTE — Assessment & Plan Note (Signed)
Controlled, no change in medication DASH diet and commitment to daily physical activity for a minimum of 30 minutes discussed and encouraged, as a part of hypertension management. The importance of attaining a healthy weight is also discussed.  BP/Weight 04/30/2015 03/04/2015 07/16/2014 07/12/2014 05/06/2014 09/02/2013 06/20/2013  Systolic BP 120 120 132 133 146 132 142  Diastolic BP 80 80 76 103 82 80 59  Wt. (Lbs) 319 317 309.12 297 320 305.12 -  BMI 56.52 56.17 54.77 50.95 56.7 54.06 -

## 2015-05-01 NOTE — Assessment & Plan Note (Signed)
Deteriorated. Patient re-educated about  the importance of commitment to a  minimum of 150 minutes of exercise per week.  The importance of healthy food choices with portion control discussed. Encouraged to start a food diary, count calories and to consider  joining a support group. Sample diet sheets offered. Goals set by the patient for the next several months.   Weight /BMI 04/30/2015 03/04/2015 07/16/2014  WEIGHT 319 lb 317 lb 309 lb 1.9 oz  HEIGHT 5\' 3"  5\' 3"  5\' 3"   BMI 56.52 kg/m2 56.17 kg/m2 54.77 kg/m2    Current exercise per week 90 minutes.

## 2015-05-01 NOTE — Assessment & Plan Note (Signed)
Repeat pap sent 04/2015, during this visit, will follow closely

## 2015-05-05 LAB — CYTOLOGY - PAP

## 2015-06-18 ENCOUNTER — Encounter: Payer: BC Managed Care – PPO | Admitting: Family Medicine

## 2015-06-23 ENCOUNTER — Other Ambulatory Visit: Payer: Self-pay

## 2015-06-23 ENCOUNTER — Emergency Department (HOSPITAL_COMMUNITY): Payer: BC Managed Care – PPO

## 2015-06-23 ENCOUNTER — Encounter (HOSPITAL_COMMUNITY): Payer: Self-pay | Admitting: Emergency Medicine

## 2015-06-23 ENCOUNTER — Emergency Department (HOSPITAL_COMMUNITY)
Admission: EM | Admit: 2015-06-23 | Discharge: 2015-06-23 | Disposition: A | Discharge status: Home / Self Care | Payer: BC Managed Care – PPO | Attending: Emergency Medicine | Admitting: Emergency Medicine

## 2015-06-23 DIAGNOSIS — Z79899 Other long term (current) drug therapy: Secondary | ICD-10-CM | POA: Insufficient documentation

## 2015-06-23 DIAGNOSIS — Z8669 Personal history of other diseases of the nervous system and sense organs: Secondary | ICD-10-CM | POA: Diagnosis not present

## 2015-06-23 DIAGNOSIS — Z7951 Long term (current) use of inhaled steroids: Secondary | ICD-10-CM | POA: Insufficient documentation

## 2015-06-23 DIAGNOSIS — J159 Unspecified bacterial pneumonia: Secondary | ICD-10-CM | POA: Insufficient documentation

## 2015-06-23 DIAGNOSIS — J189 Pneumonia, unspecified organism: Secondary | ICD-10-CM

## 2015-06-23 DIAGNOSIS — R0602 Shortness of breath: Secondary | ICD-10-CM | POA: Diagnosis present

## 2015-06-23 DIAGNOSIS — Z792 Long term (current) use of antibiotics: Secondary | ICD-10-CM | POA: Insufficient documentation

## 2015-06-23 DIAGNOSIS — J45901 Unspecified asthma with (acute) exacerbation: Secondary | ICD-10-CM | POA: Diagnosis not present

## 2015-06-23 DIAGNOSIS — I10 Essential (primary) hypertension: Secondary | ICD-10-CM | POA: Diagnosis not present

## 2015-06-23 DIAGNOSIS — Z87891 Personal history of nicotine dependence: Secondary | ICD-10-CM | POA: Insufficient documentation

## 2015-06-23 LAB — COMPREHENSIVE METABOLIC PANEL
ALT: 12 U/L — ABNORMAL LOW (ref 14–54)
AST: 15 U/L (ref 15–41)
Albumin: 3.9 g/dL (ref 3.5–5.0)
Alkaline Phosphatase: 55 U/L (ref 38–126)
Anion gap: 8 (ref 5–15)
BILIRUBIN TOTAL: 0.7 mg/dL (ref 0.3–1.2)
BUN: 11 mg/dL (ref 6–20)
CO2: 33 mmol/L — ABNORMAL HIGH (ref 22–32)
Calcium: 9.7 mg/dL (ref 8.9–10.3)
Chloride: 96 mmol/L — ABNORMAL LOW (ref 101–111)
Creatinine, Ser: 0.8 mg/dL (ref 0.44–1.00)
GFR calc Af Amer: >60 mL/min (ref >60)
Glucose, Bld: 113 mg/dL — ABNORMAL HIGH (ref 65–99)
POTASSIUM: 3.6 mmol/L (ref 3.5–5.1)
Sodium: 137 mmol/L (ref 135–145)
TOTAL PROTEIN: 7.8 g/dL (ref 6.5–8.1)

## 2015-06-23 LAB — CBC WITH DIFFERENTIAL/PLATELET
BASOS ABS: 0.1 10*3/uL (ref 0.0–0.1)
BASOS PCT: 0 %
EOS ABS: 0.2 10*3/uL (ref 0.0–0.7)
Eosinophils Relative: 1 %
HEMATOCRIT: 42.2 % (ref 36.0–46.0)
HEMOGLOBIN: 13.5 g/dL (ref 12.0–15.0)
Lymphocytes Relative: 27 %
Lymphs Abs: 4.2 10*3/uL — ABNORMAL HIGH (ref 0.7–4.0)
MCH: 27.2 pg (ref 26.0–34.0)
MCHC: 32 g/dL (ref 30.0–36.0)
MCV: 84.9 fL (ref 78.0–100.0)
Monocytes Absolute: 1.1 10*3/uL — ABNORMAL HIGH (ref 0.1–1.0)
Monocytes Relative: 7 %
NEUTROS ABS: 10.3 10*3/uL — AB (ref 1.7–7.7)
NEUTROS PCT: 65 %
Platelets: 286 10*3/uL (ref 150–400)
RBC: 4.97 MIL/uL (ref 3.87–5.11)
RDW: 15.1 % (ref 11.5–15.5)
WBC: 15.8 10*3/uL — AB (ref 4.0–10.5)

## 2015-06-23 MED ORDER — DEXTROSE 5 % IV SOLN: 1.0000 g | Freq: Once | INTRAVENOUS | Status: DC

## 2015-06-23 MED ORDER — DEXTROSE 5 % IV SOLN
1.0000 g | Freq: Once | INTRAVENOUS | Status: DC
  Filled 2015-06-23: qty 10

## 2015-06-23 MED ORDER — AZITHROMYCIN 250 MG PO TABS
500.0000 mg | ORAL_TABLET | Freq: Once | ORAL | Status: AC
  Administered 2015-06-23: 500 mg via ORAL
  Filled 2015-06-23: qty 2

## 2015-06-23 MED ORDER — AZITHROMYCIN 250 MG PO TABS: 250.0000 mg | ORAL_TABLET | Freq: Every day | ORAL | Status: DC

## 2015-06-23 MED ORDER — CEFTRIAXONE SODIUM 1 G IJ SOLR
INTRAMUSCULAR | Status: DC
Start: 2015-06-23 — End: 2015-06-23
  Filled 2015-06-23: qty 10

## 2015-06-23 MED ORDER — ALBUTEROL (5 MG/ML) CONTINUOUS INHALATION SOLN
10.0000 mg/h | INHALATION_SOLUTION | RESPIRATORY_TRACT | Status: AC
  Administered 2015-06-23: 10 mg/h via RESPIRATORY_TRACT
  Filled 2015-06-23: qty 20

## 2015-06-23 MED ORDER — LIDOCAINE HCL (PF) 1 % IJ SOLN
INTRAMUSCULAR | Status: DC
Start: 2015-06-23 — End: 2015-06-23
  Filled 2015-06-23: qty 5

## 2015-06-23 MED ORDER — PREDNISONE 20 MG PO TABS
40.0000 mg | ORAL_TABLET | Freq: Once | ORAL | Status: AC
  Administered 2015-06-23: 40 mg via ORAL
  Filled 2015-06-23: qty 2

## 2015-06-23 MED ORDER — CEFTRIAXONE SODIUM 1 G IJ SOLR
1.0000 g | Freq: Once | INTRAMUSCULAR | Status: AC
  Administered 2015-06-23: 1 g via INTRAMUSCULAR

## 2015-06-23 MED ORDER — ALBUTEROL SULFATE (2.5 MG/3ML) 0.083% IN NEBU: 5.0000 mg | INHALATION_SOLUTION | Freq: Once | RESPIRATORY_TRACT | Status: AC

## 2015-06-23 NOTE — ED Notes (Signed)
Patient complains of shortness of breath and coughing. States history of asthma. States unable to catch breath. States symptoms started last Thursday. States used albuterol neb this morning with no improvement.

## 2015-06-23 NOTE — ED Notes (Signed)
PA student at bedside.

## 2015-06-23 NOTE — ED Notes (Signed)
Pt getting continuous nebulizer, unable to ambulate at this time.

## 2015-06-23 NOTE — ED Provider Notes (Addendum)
CSN: [id number]     Arrival date & time 06/23/15  1459 History   First MD Initiated Contact with Patient 06/23/15 1506     Chief Complaint  Patient presents with  . Shortness of Breath     (Consider location/radiation/quality/duration/timing/severity/associated sxs/prior Treatment) HPI Comments: The pt is a 65 y/o female - no rf for PE - has 2 weeks of cough, sob, productive of phlegm subj fevers, graduadlly worasening despite using bronchodilators.  No swelling of legs.  Sx are severe Sat's low on arrival.  Patient is a 65 y.o. female presenting with shortness of breath. The history is provided by the patient.  Shortness of Breath  Symptoms are persistent, gradually worsening and now moderate.  No associated fevers.  Past Medical History  Diagnosis Date  . Essential hypertension, benign   . Morbid obesity (HCC)   . Bronchial asthma   . Sleep apnea   . Bacterial pneumonia     March  9-12,  2011   Past Surgical History  Procedure Laterality Date  . Cesarean section  1988 & 1991  . Right and left keloid ears  1968  . Partial left lobectomy  1968   Family History  Problem Relation Age of Onset  . Obesity Sister   . Esophageal cancer Father   . Stroke Mother   . Hypertension Mother    Social History  Substance Use Topics  . Smoking status: Former Smoker    Types: Cigarettes    Quit date: 06/14/2007  . Smokeless tobacco: None  . Alcohol Use: No   OB History    No data available     Review of Systems  Respiratory: Positive for shortness of breath.   All other systems reviewed and are negative.     Allergies  Benzonatate  Home Medications   Prior to Admission medications   Medication Sig Start Date End Date Taking? Authorizing Provider  albuterol (ACCUNEB) 1.25 MG/3ML nebulizer solution Take 3 mLs (1.25 mg total) by nebulization every 6 (six) hours as needed for wheezing. 04/30/15  Yes Kerri Perches, MD  albuterol (PROVENTIL HFA;VENTOLIN HFA) 108 (90  BASE) MCG/ACT inhaler Inhale 2 puffs into the lungs every 6 (six) hours as needed for wheezing or shortness of breath. 04/30/15  Yes Kerri Perches, MD  budesonide-formoterol [facility]) 80-4.5 MCG/ACT inhaler Inhale 2 puffs into the lungs 2 (two) times daily. 04/30/15  Yes Kerri Perches, MD  clotrimazole-betamethasone (LOTRISONE) cream apply to affected area twice a day 11/11/14  Yes Kerri Perches, MD  triamterene-hydrochlorothiazide (MAXZIDE) 75-50 MG tablet Take 1 tablet by mouth daily. 04/30/15  Yes Kerri Perches, MD  Vitamin D, Ergocalciferol, (DRISDOL) 50000 UNITS CAPS capsule Take 1 capsule (50,000 Units total) by mouth every 7 (seven) days. 04/30/15  Yes Kerri Perches, MD  azithromycin (ZITHROMAX Z-PAK) 250 MG tablet Take 1 tablet (250 mg total) by mouth daily. 500mg  PO day 1, then 250mg  PO days 205 06/23/15   Eber Hong, MD  chlorpheniramine-HYDROcodone [facility]) 10-8 MG/5ML SUER Take 5 mLs by mouth every 12 (twelve) hours as needed for cough. Patient not taking: Reported on 06/23/2015 04/30/15   Kerri Perches, MD   BP 125/65 mmHg  Pulse 79  Temp(Src) 98.6 F (37 C) (Oral)  Resp 21  Ht 5\' 4"  (1.626 m)  Wt 287 lb (130.182 kg)  BMI 49.24 kg/m2  SpO2 90% Physical Exam  Constitutional: She appears well-developed and well-nourished. No distress.  HENT:  Head: Normocephalic  and atraumatic.  Mouth/Throat: Oropharynx is clear and moist. No oropharyngeal exudate.  Eyes: Conjunctivae and EOM are normal. Pupils are equal, round, and reactive to light. Right eye exhibits no discharge. Left eye exhibits no discharge. No scleral icterus.  Neck: Normal range of motion. Neck supple. No JVD present. No thyromegaly present.  Cardiovascular: Normal rate, regular rhythm, normal heart sounds and intact distal pulses.  Exam reveals no gallop and no friction rub.   No murmur heard. Pulmonary/Chest: Effort normal. No respiratory distress. She has wheezes (  forced exp with mild wheezign, no distress, speaks in full sent). She has no rales.  Abdominal: Soft. Bowel sounds are normal. She exhibits no distension and no mass. There is no tenderness.  Musculoskeletal: Normal range of motion. She exhibits no edema or tenderness.  nmo asym no edema  Lymphadenopathy:    She has no cervical adenopathy.  Neurological: She is alert. Coordination normal.  Skin: Skin is warm and dry. No rash noted. No erythema.  Psychiatric: She has a normal mood and affect. Her behavior is normal.  Nursing note and vitals reviewed.   ED Course  Procedures (including critical care time) Labs Review Labs Reviewed  CBC WITH DIFFERENTIAL/PLATELET - Abnormal; Notable for the following:    WBC 15.8 (*)    Neutro Abs 10.3 (*)    Lymphs Abs 4.2 (*)    Monocytes Absolute 1.1 (*)    All other components within normal limits  COMPREHENSIVE METABOLIC PANEL - Abnormal; Notable for the following:    Chloride 96 (*)    CO2 33 (*)    Glucose, Bld 113 (*)    ALT 12 (*)    All other components within normal limits    Imaging Review Dg Chest 2 View  06/23/2015  CLINICAL DATA:  Shortness of breath EXAM: CHEST  2 VIEW COMPARISON:  CT chest 07/12/2014 FINDINGS: There is mild elevation of the right diaphragm. There is left lower lobe airspace disease concerning for pneumonia. There is no pleural effusion or pneumothorax. The heart and mediastinal contours are unremarkable. The osseous structures are unremarkable. IMPRESSION: Left lower lobe airspace disease concerning for pneumonia. Electronically Signed   By: Elige KoHetal  Patel   On: 06/23/2015 17:14   I have personally reviewed and evaluated these images and lab results as part of my medical decision-making.  ED ECG REPORT  I personally interpreted this EKG   Date: 06/23/2015   Rate: 79  Rhythm: normal sinus rhythm  QRS Axis: left  Intervals: normal  ST/T Wave abnormalities: normal  Conduction Disutrbances:none  Narrative  Interpretation:   Old EKG Reviewed: unchanged c/w 07/14/14   MDM   Final diagnoses:  CAP (community acquired pneumonia)    Well appearing, sat's possibley low - check cxr, labs, amb with pulse ox, neb, steroids  Ambulated with pulse ox of of 93% + - in no distress - RR of 20 on my exam - sats of 94% at rest - pt informed of results - she is agreeable to d/c and return should she wosren.  No hypoxia.  Labs with leukocytosis.  Meds given in ED:  Medications  albuterol (PROVENTIL,VENTOLIN) solution continuous neb (10 mg/hr Nebulization New Bag/Given 06/23/15 1555)  lidocaine (PF) (XYLOCAINE) 1 % injection (not administered)  cefTRIAXone (ROCEPHIN) 1 g injection (not administered)  albuterol (PROVENTIL) (2.5 MG/3ML) 0.083% nebulizer solution 5 mg (0 mg Nebulization Duplicate 06/23/15 1600)  predniSONE (DELTASONE) tablet 40 mg (40 mg Oral Given 06/23/15 1613)  azithromycin (ZITHROMAX) tablet 500  mg (500 mg Oral Given 06/23/15 1806)  cefTRIAXone (ROCEPHIN) injection 1 g (1 g Intramuscular Given 06/23/15 1807)    New Prescriptions   AZITHROMYCIN (ZITHROMAX Z-PAK) 250 MG TABLET    Take 1 tablet (250 mg total) by mouth daily. 500mg  PO day 1, then 250mg  PO days 205        Eber Hong, MD 06/23/15 1830  Eber Hong, MD 06/23/15 [id number]

## 2015-06-23 NOTE — Discharge Instructions (Signed)
Inhaler 2 puffs every 4 hours for 24 hours, then every 4 hours as needed Zithromax for the next 5 days Return for high fevers, severe pain / vomiting or shortness of breath.  Please obtain all of your results from medical records or have your doctors office obtain the results - share them with your doctor - you should be seen at your doctors office in the next 2 days. Call today to arrange your follow up. Take the medications as prescribed. Please review all of the medicines and only take them if you do not have an allergy to them. Please be aware that if you are taking birth control pills, taking other prescriptions, ESPECIALLY ANTIBIOTICS may make the birth control ineffective - if this is the case, either do not engage in sexual activity or use alternative methods of birth control such as condoms until you have finished the medicine and your family doctor says it is OK to restart them. If you are on a blood thinner such as COUMADIN, be aware that any other medicine that you take may cause the coumadin to either work too much, or not enough - you should have your coumadin level rechecked in next 7 days if this is the case.  ?  It is also a possibility that you have an allergic reaction to any of the medicines that you have been prescribed - Everybody reacts differently to medications and while MOST people have no trouble with most medicines, you may have a reaction such as nausea, vomiting, rash, swelling, shortness of breath. If this is the case, please stop taking the medicine immediately and contact your physician.  ?  You should return to the ER if you develop severe or worsening symptoms.

## 2015-06-23 NOTE — ED Notes (Signed)
Pt tolerated ambulation well with 02 stat maintaining 93.

## 2015-06-25 ENCOUNTER — Ambulatory Visit (INDEPENDENT_AMBULATORY_CARE_PROVIDER_SITE_OTHER): Payer: BC Managed Care – PPO | Admitting: Family Medicine

## 2015-06-25 ENCOUNTER — Encounter: Payer: Self-pay | Admitting: Family Medicine

## 2015-06-25 VITALS — BP 130/70 | HR 68 | Resp 18 | Ht 63.0 in | Wt 321.0 lb

## 2015-06-25 DIAGNOSIS — J189 Pneumonia, unspecified organism: Secondary | ICD-10-CM | POA: Diagnosis not present

## 2015-06-25 DIAGNOSIS — I10 Essential (primary) hypertension: Secondary | ICD-10-CM

## 2015-06-25 DIAGNOSIS — Z6841 Body Mass Index (BMI) 40.0 and over, adult: Secondary | ICD-10-CM

## 2015-06-25 DIAGNOSIS — J45991 Cough variant asthma: Secondary | ICD-10-CM | POA: Diagnosis not present

## 2015-06-25 DIAGNOSIS — R7303 Prediabetes: Secondary | ICD-10-CM | POA: Diagnosis not present

## 2015-06-25 MED ORDER — CEFTRIAXONE SODIUM 500 MG IJ SOLR
500.0000 mg | Freq: Once | INTRAMUSCULAR | Status: AC
  Administered 2015-06-25: 500 mg via INTRAMUSCULAR

## 2015-06-25 MED ORDER — PREDNISONE 5 MG (21) PO TBPK: 5.0000 mg | ORAL_TABLET | ORAL | Status: DC

## 2015-06-25 NOTE — Patient Instructions (Signed)
F/u in March as before, call if you need me sooner  You have pneumonia, important to complete antibiotics as prescribed, you will receive an additional dose of Rocephin in the office today  OK to use decongestant syrup to help clear mucus  Your asthma is uncontrolled now , likely because of the infection, a prednisone dose pack is sent in start that today to help breathing  Work excuse today starting from 01/10 2017 , return 07/06/2015  CXR Feb2  Labs non fasting Feb 2

## 2015-06-25 NOTE — Assessment & Plan Note (Addendum)
Complete z pack Rocephin 500 mg IM  Today, extend work excuse for an additional 4 work days,  CBC and diff in 10 days follow up

## 2015-06-25 NOTE — Progress Notes (Signed)
   Subjective:    Patient ID: Brandy Dean, female    DOB: 08/02/1950, 65 y.o.   MRN: [id number]  HPI Pt in for f/u from recent ED visit on 06/23/2015, when she was diagnosed with CAP. Has work excuse for 1 week, however, still very weak ,a nd now also experiencing symptoms of asthma flare with shortness of breath, wheezing and need for increased neb treatments Has not had flu vaccine as yet , will reconsider the decision and hopefully will return when better Had been sick with fatigue, fever , chills and cough for 1 week before Ed visit   Review of Systems See HPI  Denies sinus pressure, nasal congestion, ear pain or sore throat.  Denies chest pains, palpitations and leg swelling Denies abdominal pain, nausea, vomiting,diarrhea or constipation.   Denies dysuria, frequency, hesitancy or incontinence. Denies joint pain, swelling and limitation in mobility. Denies headaches, seizures, numbness, or tingling. Denies depression, anxiety or insomnia. Denies skin break down or rash.        Objective:   Physical Exam  BP 130/70 mmHg  Pulse 68  Resp 18  Ht 5\' 3"  (1.6 m)  Wt 321 lb (145.605 kg)  BMI 56.88 kg/m2  SpO2 90%  Patient alert and oriented and in moderate pulmonary distress.  HEENT: No facial asymmetry, EOMI,   oropharynx pink and moist.  Neck supple no JVD, no mass.  Chest: Reduced  entry bilateral wheezes, bronchlal breath sounds in LLL CVS: S1, S2 no murmurs, no S3.Regular rate.  ABD: Soft non tender.   Ext: No edema  MS: Adequate ROM spine, shoulders, hips and knees.  Skin: Intact, no ulcerations or rash noted.  Psych: Good eye contact, normal affect. Memory intact not anxious or depressed appearing.  CNS: CN 2-12 intact, power,  normal throughout.no focal deficits noted.      Assessment & Plan:  CAP (community acquired pneumonia) Complete z pack Rocephin 500 mg IM  Today, extend work excuse for an additional 4 work days,  CBC and diff in 10 days  follow up  Asthma, cough variant uncontrolled due to infection, neb treatments every 4 hrs  at home per pt, pred dose pack sent in  Essential hypertension Controlled, no change in medication DASH diet and commitment to daily physical activity for a minimum of 30 minutes discussed and encouraged, as a part of hypertension management. The importance of attaining a healthy weight is also discussed.  BP/Weight 06/25/2015 06/23/2015 04/30/2015 03/04/2015 07/16/2014 07/12/2014 05/06/2014  Systolic BP 130 125 120 120 132 133 146  Diastolic BP 70 65 80 80 76 103 82  Wt. (Lbs) 321 287 319 317 309.12 297 320  BMI 56.88 49.24 56.52 56.17 54.77 50.95 56.7        Morbid obesity with BMI of 50.0-59.9, adult Deteriorated. Patient re-educated about  the importance of commitment to a  minimum of 150 minutes of exercise per week.  The importance of healthy food choices with portion control discussed. Encouraged to start a food diary, count calories and to consider  joining a support group. Sample diet sheets offered. Goals set by the patient for the next several months.   Weight /BMI 06/25/2015 06/23/2015 04/30/2015  WEIGHT 321 lb 287 lb 319 lb  HEIGHT 5\' 3"  5\' 4"  5\' 3"   BMI 56.88 kg/m2 49.24 kg/m2 56.52 kg/m2    Current exercise per week 60 minutes.

## 2015-06-25 NOTE — Assessment & Plan Note (Addendum)
uncontrolled due to infection, neb treatments every 4 hrs  at home per pt, pred dose pack sent in

## 2015-06-28 NOTE — Assessment & Plan Note (Signed)
Deteriorated. Patient re-educated about  the importance of commitment to a  minimum of 150 minutes of exercise per week.  The importance of healthy food choices with portion control discussed. Encouraged to start a food diary, count calories and to consider  joining a support group. Sample diet sheets offered. Goals set by the patient for the next several months.   Weight /BMI 06/25/2015 06/23/2015 04/30/2015  WEIGHT 321 lb 287 lb 319 lb  HEIGHT 5\' 3"  5\' 4"  5\' 3"   BMI 56.88 kg/m2 49.24 kg/m2 56.52 kg/m2    Current exercise per week 60 minutes.

## 2015-06-28 NOTE — Assessment & Plan Note (Signed)
Controlled, no change in medication DASH diet and commitment to daily physical activity for a minimum of 30 minutes discussed and encouraged, as a part of hypertension management. The importance of attaining a healthy weight is also discussed.  BP/Weight 06/25/2015 06/23/2015 04/30/2015 03/04/2015 07/16/2014 07/12/2014 05/06/2014  Systolic BP 130 125 120 120 132 133 146  Diastolic BP 70 65 80 80 76 103 82  Wt. (Lbs) 321 287 319 317 309.12 297 320  BMI 56.88 49.24 56.52 56.17 54.77 50.95 56.7

## 2015-07-16 ENCOUNTER — Other Ambulatory Visit: Payer: Self-pay | Admitting: Family Medicine

## 2015-07-16 ENCOUNTER — Telehealth: Payer: Self-pay

## 2015-07-16 LAB — CBC WITH DIFFERENTIAL/PLATELET
Basophils Absolute: 0 10*3/uL (ref 0.0–0.1)
Basophils Relative: 0 % (ref 0–1)
EOS PCT: 5 % (ref 0–5)
Eosinophils Absolute: 0.4 10*3/uL (ref 0.0–0.7)
HEMATOCRIT: 39.9 % (ref 36.0–46.0)
HEMOGLOBIN: 12.9 g/dL (ref 12.0–15.0)
LYMPHS ABS: 2.7 10*3/uL (ref 0.7–4.0)
LYMPHS PCT: 34 % (ref 12–46)
MCH: 26.5 pg (ref 26.0–34.0)
MCHC: 32.3 g/dL (ref 30.0–36.0)
MCV: 82.1 fL (ref 78.0–100.0)
MONO ABS: 0.6 10*3/uL (ref 0.1–1.0)
MPV: 10.1 fL (ref 8.6–12.4)
Monocytes Relative: 8 % (ref 3–12)
Neutro Abs: 4.2 10*3/uL (ref 1.7–7.7)
Neutrophils Relative %: 53 % (ref 43–77)
Platelets: 210 10*3/uL (ref 150–400)
RBC: 4.86 MIL/uL (ref 3.87–5.11)
RDW: 15.5 % (ref 11.5–15.5)
WBC: 7.9 10*3/uL (ref 4.0–10.5)

## 2015-07-16 MED ORDER — FLUTICASONE-SALMETEROL 100-50 MCG/DOSE IN AEPB: 1.0000 | INHALATION_SPRAY | Freq: Two times a day (BID) | RESPIRATORY_TRACT | Status: DC

## 2015-07-16 NOTE — Telephone Encounter (Signed)
symbicort not covered- must change to advair or breo. Please advise

## 2015-07-16 NOTE — Telephone Encounter (Signed)
Send in advair 100/50 twice daily pls , let her know

## 2015-07-16 NOTE — Telephone Encounter (Signed)
Number not accepting voicemail messages. Sent to the pharmacy with message to make her aware of the change

## 2015-07-17 LAB — BASIC METABOLIC PANEL
BUN: 14 mg/dL (ref 7–25)
CALCIUM: 9.7 mg/dL (ref 8.6–10.4)
CO2: 35 mmol/L — ABNORMAL HIGH (ref 20–31)
CREATININE: 0.6 mg/dL (ref 0.50–0.99)
Chloride: 99 mmol/L (ref 98–110)
GLUCOSE: 94 mg/dL (ref 65–99)
Potassium: 4.1 mmol/L (ref 3.5–5.3)
Sodium: 138 mmol/L (ref 135–146)

## 2015-07-17 LAB — HEMOGLOBIN A1C
HEMOGLOBIN A1C: 6.2 % — AB (ref <5.7)
MEAN PLASMA GLUCOSE: 131 mg/dL — AB (ref <117)

## 2015-08-10 ENCOUNTER — Telehealth: Payer: Self-pay | Admitting: Family Medicine

## 2015-08-10 NOTE — Telephone Encounter (Signed)
reill x 1 is ok I am changing to dulera, let her know and fax pls

## 2015-08-10 NOTE — Telephone Encounter (Signed)
Patient is asking for a refill on chlorpheniramine-HYDROcodone (TUSSIONEX PENNKINETIC ER) 10-8 MG/5ML SUER  And also her insurance does not pay anymore on Symbicort and she cant take Advair shes allergic to it, please advise?

## 2015-08-10 NOTE — Telephone Encounter (Signed)
Please advise 

## 2015-08-11 ENCOUNTER — Other Ambulatory Visit: Payer: Self-pay

## 2015-08-11 MED ORDER — MOMETASONE FURO-FORMOTEROL FUM 200-5 MCG/ACT IN AERO: 2.0000 | INHALATION_SPRAY | Freq: Two times a day (BID) | RESPIRATORY_TRACT | Status: DC

## 2015-08-11 MED ORDER — HYDROCOD POLST-CPM POLST ER 10-8 MG/5ML PO SUER: 5.0000 mL | Freq: Two times a day (BID) | ORAL | Status: DC | PRN

## 2015-08-11 NOTE — Telephone Encounter (Signed)
Patient aware and will have son to come and collect script.  Aware of change in inhaler as well.

## 2015-09-08 ENCOUNTER — Ambulatory Visit: Payer: BC Managed Care – PPO | Admitting: Family Medicine

## 2015-09-17 ENCOUNTER — Ambulatory Visit (INDEPENDENT_AMBULATORY_CARE_PROVIDER_SITE_OTHER): Payer: BC Managed Care – PPO | Admitting: Family Medicine

## 2015-09-17 ENCOUNTER — Encounter: Payer: Self-pay | Admitting: Family Medicine

## 2015-09-17 VITALS — BP 130/80 | HR 68 | Resp 18 | Ht 63.0 in | Wt 315.0 lb

## 2015-09-17 DIAGNOSIS — J45991 Cough variant asthma: Secondary | ICD-10-CM

## 2015-09-17 DIAGNOSIS — E785 Hyperlipidemia, unspecified: Secondary | ICD-10-CM | POA: Diagnosis not present

## 2015-09-17 DIAGNOSIS — E559 Vitamin D deficiency, unspecified: Secondary | ICD-10-CM

## 2015-09-17 DIAGNOSIS — I10 Essential (primary) hypertension: Secondary | ICD-10-CM | POA: Diagnosis not present

## 2015-09-17 DIAGNOSIS — R7303 Prediabetes: Secondary | ICD-10-CM | POA: Diagnosis not present

## 2015-09-17 DIAGNOSIS — Z6841 Body Mass Index (BMI) 40.0 and over, adult: Secondary | ICD-10-CM

## 2015-09-17 DIAGNOSIS — K8020 Calculus of gallbladder without cholecystitis without obstruction: Secondary | ICD-10-CM

## 2015-09-17 DIAGNOSIS — Z1211 Encounter for screening for malignant neoplasm of colon: Secondary | ICD-10-CM

## 2015-09-17 MED ORDER — CLOTRIMAZOLE-BETAMETHASONE 1-0.05 % EX CREA: TOPICAL_CREAM | CUTANEOUS | Status: DC

## 2015-09-17 MED ORDER — VITAMIN D (ERGOCALCIFEROL) 1.25 MG (50000 UNIT) PO CAPS: 50000.0000 [IU] | ORAL_CAPSULE | ORAL | Status: DC

## 2015-09-17 NOTE — Patient Instructions (Signed)
F/u end August call if you need me sooner  Please work on good  health habits so that your health will improve. 1. Commitment to daily physical activity for 30 to 60  minutes, if you are able to do this.  2. Commitment to wise food choices. Aim for half of your  food intake to be vegetable and fruit, one quarter starchy foods, and one quarter protein. Try to eat on a regular schedule  3 meals per day, snacking between meals should be limited to vegetables or fruits or small portions of nuts. 64 ounces of water per day is generally recommended, unless you have specific health conditions, like heart failure or kidney failure where you will need to limit fluid intake.  3. Commitment to sufficient and a  good quality of physical and mental rest daily, generally between 6 to 8 hours per day.  WITH PERSISTANCE AND PERSEVERANCE, THE IMPOSSIBLE , BECOMES THE NORM!   No med changes  It is important that you exercise regularly at least 30 minutes 7 times a week. If you develop chest pain, have severe difficulty breathing, or feel very tired, stop exercising immediately and seek medical attention   PLS SCHEDULE YOUR MAMMOGRAM, past due  You are referred for colonoscopy which you need

## 2015-09-17 NOTE — Progress Notes (Signed)
Subjective:    Patient ID: Brandy Dean, female    DOB: 11/16/1950, 65 y.o.   MRN: [id number]  HPI   Brandy Dean     MRN: [id number]      DOB: 10/10/1950   HPI [name] is here for follow up and re-evaluation of chronic medical conditions, medication management and review of any available recent lab and radiology data.  Preventive health is updated, specifically  Cancer screening and Immunization.   Questions or concerns regarding consultations or procedures which the PT has had in the interim are  addressed. The PT denies any adverse reactions to current medications since the last visit.  There are no new concerns.  There are no specific complaints   ROS Denies recent fever or chills. Denies sinus pressure, nasal congestion, ear pain or sore throat. Denies chest congestion, productive cough or wheezing. Denies chest pains, palpitations and leg swelling Denies abdominal pain, nausea, vomiting,diarrhea or constipation.   Denies dysuria, frequency, hesitancy or incontinence. Denies joint pain, swelling and limitation in mobility. Denies headaches, seizures, numbness, or tingling. Denies depression, anxiety or insomnia. Denies skin break down or rash.   PE  BP 130/80 mmHg  Pulse 68  Resp 18  Ht 5\' 3"  (1.6 m)  Wt 315 lb (142.883 kg)  BMI 55.81 kg/m2  SpO2 90%  Patient alert and oriented and in no cardiopulmonary distress.  HEENT: No facial asymmetry, EOMI,   oropharynx pink and moist.  Neck supple no JVD, no mass.  Chest: Clear to auscultation bilaterally.  CVS: S1, S2 no murmurs, no S3.Regular rate.  ABD: Soft non tender.   Ext: No edema  MS: Adequate ROM spine, shoulders, hips and knees.  Skin: Intact, no ulcerations or rash noted.  Psych: Good eye contact, normal affect. Memory intact not anxious or depressed appearing.  CNS: CN 2-12 intact, power,  normal throughout.no focal deficits noted.   Assessment & Plan  Essential hypertension Controlled,  no change in medication DASH diet and commitment to daily physical activity for a minimum of 30 minutes discussed and encouraged, as a part of hypertension management. The importance of attaining a healthy weight is also discussed.  BP/Weight 09/17/2015 06/25/2015 06/23/2015 04/30/2015 03/04/2015 07/16/2014 07/12/2014  Systolic BP 130 130 125 120 120 132 133  Diastolic BP 80 70 65 80 80 76 [id number]  Wt. (Lbs) 315 321 287 319 317 309.12 297  BMI 55.81 56.88 49.24 56.52 56.17 54.77 50.95        Asthma, cough variant Controlled, no change in medication   Cholelithiasis Asymptomatic, watchful waiting only  Hyperlipidemia with target LDL less than 100 Hyperlipidemia:Low fat diet discussed and encouraged.   Lipid Panel  Lab Results  Component Value Date   CHOL 206* 07/16/2014   HDL 64 07/16/2014   LDLCALC 126* 07/16/2014   TRIG 82 07/16/2014   CHOLHDL 3.2 07/16/2014   Updated lab needed at/ before next visit.      Morbid obesity with BMI of 50.0-59.9, adult Deteriorated. Patient re-educated about  the importance of commitment to a  minimum of 150 minutes of exercise per week.  The importance of healthy food choices with portion control discussed. Encouraged to start a food diary, count calories and to consider  joining a support group. Sample diet sheets offered. Goals set by the patient for the next several months.   Weight /BMI 09/17/2015 06/25/2015 06/23/2015  WEIGHT 315 lb 321 lb 287 lb  HEIGHT 5\' 3"  5\' 3"  5\' 4"   BMI 55.81 kg/m2 56.88 kg/m2 49.24 kg/m2    Current exercise per week 90  minutes.   Prediabetes Deteriorated Patient educated about the importance of limiting  Carbohydrate intake , the need to commit to daily physical activity for a minimum of 30 minutes , and to commit weight loss. The fact that changes in all these areas will reduce or eliminate all together the development of diabetes is stressed.   Diabetic Labs Latest Ref Rng 07/16/2015 06/23/2015 03/04/2015  07/16/2014 07/12/2014  HbA1c <5.7 % 6.2(H) - 6.0(H) 6.0(H) -  Chol 0 - 200 mg/dL - - - 161(W) -  HDL >96 mg/dL - - - 64 -  Calc LDL 0 - 99 mg/dL - - - 045(W) -  Triglycerides <150 mg/dL - - - 82 -  Creatinine 0.50 - 0.99 mg/dL 0.98 1.19 1.47 - 8.29   BP/Weight 09/17/2015 06/25/2015 06/23/2015 04/30/2015 03/04/2015 07/16/2014 07/12/2014  Systolic BP 130 130 125 120 120 132 133  Diastolic BP 80 70 65 80 80 76 562  Wt. (Lbs) 315 321 287 319 317 309.12 297  BMI 55.81 56.88 49.24 56.52 56.17 54.77 50.95   No flowsheet data found.          Review of Systems     Objective:   Physical Exam        Assessment & Plan:

## 2015-09-20 NOTE — Assessment & Plan Note (Signed)
Hyperlipidemia:Low fat diet discussed and encouraged.   Lipid Panel  Lab Results  Component Value Date   CHOL 206* 07/16/2014   HDL 64 07/16/2014   LDLCALC 126* 07/16/2014   TRIG 82 07/16/2014   CHOLHDL 3.2 07/16/2014   Updated lab needed at/ before next visit.     

## 2015-09-20 NOTE — Assessment & Plan Note (Signed)
Controlled, no change in medication DASH diet and commitment to daily physical activity for a minimum of 30 minutes discussed and encouraged, as a part of hypertension management. The importance of attaining a healthy weight is also discussed.  BP/Weight 09/17/2015 06/25/2015 06/23/2015 04/30/2015 03/04/2015 07/16/2014 07/12/2014  Systolic BP 130 130 125 120 120 132 133  Diastolic BP 80 70 65 80 80 76 [id number]  Wt. (Lbs) 315 321 287 319 317 309.12 297  BMI 55.81 56.88 49.24 56.52 56.17 54.77 50.95

## 2015-09-20 NOTE — Assessment & Plan Note (Signed)
Controlled, no change in medication  

## 2015-09-20 NOTE — Assessment & Plan Note (Addendum)
Deteriorated Patient educated about the importance of limiting  Carbohydrate intake , the need to commit to daily physical activity for a minimum of 30 minutes , and to commit weight loss. The fact that changes in all these areas will reduce or eliminate all together the development of diabetes is stressed.   Diabetic Labs Latest Ref Rng 07/16/2015 06/23/2015 03/04/2015 07/16/2014 07/12/2014  HbA1c <5.7 % 6.2(H) - 6.0(H) 6.0(H) -  Chol 0 - 200 mg/dL - - - 161(W206(H) -  HDL >96>39 mg/dL - - - 64 -  Calc LDL 0 - 99 mg/dL - - - 045(W126(H) -  Triglycerides <150 mg/dL - - - 82 -  Creatinine 0.50 - 0.99 mg/dL 0.980.60 1.190.80 1.470.80 - 8.290.74   BP/Weight 09/17/2015 06/25/2015 06/23/2015 04/30/2015 03/04/2015 07/16/2014 07/12/2014  Systolic BP 130 130 125 120 120 132 133  Diastolic BP 80 70 65 80 80 76 [id number]  Wt. (Lbs) 315 321 287 319 317 309.12 297  BMI 55.81 56.88 49.24 56.52 56.17 54.77 50.95   No flowsheet data found.

## 2015-09-20 NOTE — Assessment & Plan Note (Signed)
Asymptomatic, watchful waiting only

## 2015-09-20 NOTE — Assessment & Plan Note (Signed)
Deteriorated. Patient re-educated about  the importance of commitment to a  minimum of 150 minutes of exercise per week.  The importance of healthy food choices with portion control discussed. Encouraged to start a food diary, count calories and to consider  joining a support group. Sample diet sheets offered. Goals set by the patient for the next several months.   Weight /BMI 09/17/2015 06/25/2015 06/23/2015  WEIGHT 315 lb 321 lb 287 lb  HEIGHT 5\' 3"  5\' 3"  5\' 4"   BMI 55.81 kg/m2 56.88 kg/m2 49.24 kg/m2    Current exercise per week 90  minutes.

## 2015-11-12 ENCOUNTER — Other Ambulatory Visit: Payer: Self-pay

## 2015-11-12 DIAGNOSIS — I10 Essential (primary) hypertension: Secondary | ICD-10-CM

## 2015-11-12 MED ORDER — TRIAMTERENE-HCTZ 75-50 MG PO TABS: 1.0000 | ORAL_TABLET | Freq: Every day | ORAL | Status: DC

## 2016-02-08 ENCOUNTER — Ambulatory Visit: Payer: BC Managed Care – PPO | Admitting: Family Medicine

## 2016-02-08 ENCOUNTER — Encounter: Payer: Self-pay | Admitting: Family Medicine

## 2016-04-07 ENCOUNTER — Ambulatory Visit: Payer: BC Managed Care – PPO | Admitting: Family Medicine

## 2016-04-08 ENCOUNTER — Telehealth: Payer: Self-pay

## 2016-04-08 DIAGNOSIS — R7303 Prediabetes: Secondary | ICD-10-CM

## 2016-04-08 DIAGNOSIS — I10 Essential (primary) hypertension: Secondary | ICD-10-CM

## 2016-04-08 DIAGNOSIS — E785 Hyperlipidemia, unspecified: Secondary | ICD-10-CM

## 2016-04-08 DIAGNOSIS — E559 Vitamin D deficiency, unspecified: Secondary | ICD-10-CM

## 2016-04-09 NOTE — Telephone Encounter (Signed)
Expired labs reordered  

## 2016-04-25 ENCOUNTER — Other Ambulatory Visit: Payer: Self-pay | Admitting: Family Medicine

## 2016-04-25 ENCOUNTER — Ambulatory Visit: Payer: BC Managed Care – PPO | Admitting: Family Medicine

## 2016-04-25 DIAGNOSIS — Z1231 Encounter for screening mammogram for malignant neoplasm of breast: Secondary | ICD-10-CM

## 2016-05-03 ENCOUNTER — Other Ambulatory Visit: Payer: Self-pay | Admitting: Family Medicine

## 2016-05-03 DIAGNOSIS — I10 Essential (primary) hypertension: Secondary | ICD-10-CM

## 2016-05-16 ENCOUNTER — Ambulatory Visit (HOSPITAL_COMMUNITY)
Admission: RE | Admit: 2016-05-16 | Discharge: 2016-05-16 | Disposition: A | Discharge status: Home / Self Care | Payer: BC Managed Care – PPO | Source: Ambulatory Visit | Attending: Family Medicine | Admitting: Family Medicine

## 2016-05-16 DIAGNOSIS — Z1231 Encounter for screening mammogram for malignant neoplasm of breast: Secondary | ICD-10-CM | POA: Insufficient documentation

## 2016-05-30 LAB — COMPLETE METABOLIC PANEL WITH GFR
ALT: 11 U/L (ref 6–29)
AST: 14 U/L (ref 10–35)
Albumin: 3.9 g/dL (ref 3.6–5.1)
Alkaline Phosphatase: 52 U/L (ref 33–130)
BUN: 16 mg/dL (ref 7–25)
CHLORIDE: 100 mmol/L (ref 98–110)
CO2: 32 mmol/L — AB (ref 20–31)
Calcium: 10 mg/dL (ref 8.6–10.4)
Creat: 0.78 mg/dL (ref 0.50–0.99)
GFR, EST NON AFRICAN AMERICAN: 81 mL/min (ref >=60)
GLUCOSE: 94 mg/dL (ref 65–99)
POTASSIUM: 4.1 mmol/L (ref 3.5–5.3)
SODIUM: 138 mmol/L (ref 135–146)
Total Bilirubin: 0.5 mg/dL (ref 0.2–1.2)
Total Protein: 6.8 g/dL (ref 6.1–8.1)

## 2016-05-30 LAB — LIPID PANEL
CHOL/HDL RATIO: 3 ratio (ref <5.0)
Cholesterol: 208 mg/dL — ABNORMAL HIGH (ref <200)
HDL: 69 mg/dL (ref >50)
LDL CALC: 127 mg/dL — AB (ref <100)
Triglycerides: 62 mg/dL (ref <150)
VLDL: 12 mg/dL (ref <30)

## 2016-05-30 LAB — TSH: TSH: 1.76 mIU/L

## 2016-05-31 LAB — VITAMIN D 25 HYDROXY (VIT D DEFICIENCY, FRACTURES): VIT D 25 HYDROXY: 11 ng/mL — AB (ref 30–100)

## 2016-05-31 LAB — HEMOGLOBIN A1C
Hgb A1c MFr Bld: 5.7 % — ABNORMAL HIGH (ref <5.7)
MEAN PLASMA GLUCOSE: 117 mg/dL

## 2016-06-01 ENCOUNTER — Ambulatory Visit (INDEPENDENT_AMBULATORY_CARE_PROVIDER_SITE_OTHER): Payer: BC Managed Care – PPO | Admitting: Family Medicine

## 2016-06-01 ENCOUNTER — Encounter: Payer: Self-pay | Admitting: Family Medicine

## 2016-06-01 ENCOUNTER — Other Ambulatory Visit (HOSPITAL_COMMUNITY)
Admission: RE | Admit: 2016-06-01 | Discharge: 2016-06-01 | Disposition: A | Discharge status: Home / Self Care | Payer: BC Managed Care – PPO | Source: Ambulatory Visit | Attending: Family Medicine | Admitting: Family Medicine

## 2016-06-01 VITALS — BP 134/82 | HR 91 | Resp 16 | Ht 63.0 in | Wt 317.0 lb

## 2016-06-01 DIAGNOSIS — Z1211 Encounter for screening for malignant neoplasm of colon: Secondary | ICD-10-CM | POA: Diagnosis not present

## 2016-06-01 DIAGNOSIS — Z0000 Encounter for general adult medical examination without abnormal findings: Secondary | ICD-10-CM | POA: Diagnosis not present

## 2016-06-01 DIAGNOSIS — Z23 Encounter for immunization: Secondary | ICD-10-CM | POA: Diagnosis not present

## 2016-06-01 DIAGNOSIS — Z01419 Encounter for gynecological examination (general) (routine) without abnormal findings: Secondary | ICD-10-CM | POA: Insufficient documentation

## 2016-06-01 DIAGNOSIS — Z124 Encounter for screening for malignant neoplasm of cervix: Secondary | ICD-10-CM

## 2016-06-01 LAB — POC HEMOCCULT BLD/STL (OFFICE/1-CARD/DIAGNOSTIC): FECAL OCCULT BLD: NEGATIVE

## 2016-06-01 MED ORDER — HYDROCOD POLST-CPM POLST ER 10-8 MG/5ML PO SUER: 5.0000 mL | Freq: Two times a day (BID) | ORAL | 0 refills | Status: DC | PRN

## 2016-06-01 NOTE — Assessment & Plan Note (Signed)
After obtaining informed consent, the vaccine is  administered by LPN.  

## 2016-06-01 NOTE — Progress Notes (Signed)
    Brandy BreedJennifer S Dean     MRN: [id number]      DOB: 08/18/1950  HPI: Patient is in for annual physical exam. No other health concerns are expressed or addressed at the visit. Recent labs, if available are reviewed. Immunization is reviewed , and  updated if needed.   PE: BP 134/82   Pulse 91   Resp 16   Ht 5\' 3"  (1.6 m)   Wt (!) 317 lb (143.8 kg)   SpO2 94%   BMI 56.15 kg/m   Pleasant  female, alert and oriented x 3, in no cardio-pulmonary distress. Afebrile. HEENT No facial trauma or asymetry. Sinuses non tender.  Extra occullar muscles intact, pupils equally reactive to light. External ears normal, tympanic membranes clear. Oropharynx moist, no exudate. Neck: supple, no adenopathy,JVD or thyromegaly.No bruits.  Chest: Clear to ascultation bilaterally.No crackles or wheezes. Non tender to palpation  Breast: No asymetry,no masses or lumps. No tenderness. No nipple discharge or inversion. No axillary or supraclavicular adenopathy  Cardiovascular system; Heart sounds normal,  S1 and  S2 ,no S3.  No murmur, or thrill. Apical beat not displaced Peripheral pulses normal.  Abdomen: Soft, non tender, no organomegaly or masses. No bruits. Bowel sounds normal. No guarding, tenderness or rebound.  Rectal:  Normal sphincter tone. No rectal mass. Guaiac negative stool.  GU: External genitalia normal female genitalia , normal female distribution of hair. No lesions. Urethral meatus normal in size, no  Prolapse, no lesions visibly  Present. Bladder non tender. Vagina pink and moist , with no visible lesions , discharge present . Adequate pelvic support no  cystocele or rectocele noted Cervix pink and appears healthy, no lesions or ulcerations noted, no discharge noted from os Uterus normal size, no adnexal masses, no cervical motion or adnexal tenderness.   Musculoskeletal exam: Full ROM of spine, hips , shoulders and knees. No deformity ,swelling or crepitus noted. No  muscle wasting or atrophy.   Neurologic: Cranial nerves 2 to 12 intact. Power, tone ,sensation and reflexes normal throughout. No disturbance in gait. No tremor.  Skin: Intact, no ulceration, erythema , scaling or rash noted. Pigmentation normal throughout  Psych; Normal mood and affect. Judgement and concentration normal   Assessment & Plan:  Annual physical exam Annual exam as documented. Counseling done  re healthy lifestyle involving commitment to 150 minutes exercise per week, heart healthy diet, and attaining healthy weight.The importance of adequate sleep also discussed. Regular seat belt use and home safety, is also discussed. Changes in health habits are decided on by the patient with goals and time frames  set for achieving them. Immunization and cancer screening needs are specifically addressed at this visit.   Need for prophylactic vaccination and inoculation against influenza After obtaining informed consent, the vaccine is  administered by LPN.

## 2016-06-01 NOTE — Assessment & Plan Note (Signed)

## 2016-06-01 NOTE — Patient Instructions (Addendum)
F/u in 5 month, call if you need me before. Need to take weekly vitamin D this is low  Pap sent today  Flu vaccine today     Vit D , fasting lipid, chem 7 , HBA1C, CBC in 5 month  It is important that you exercise regularly at least 30 minutes 5 times a week. If you develop chest pain, have severe difficulty breathing, or feel very tired, stop exercising immediately and seek medical attention  .Please work on good  health habits so that your health will improve. 1. Commitment to daily physical activity for 30 to 60  minutes, if you are able to do this.  2. Commitment to wise food choices. Aim for half of your  food intake to be vegetable and fruit, one quarter starchy foods, and one quarter protein. Try to eat on a regular schedule  3 meals per day, snacking between meals should be limited to vegetables or fruits or small portions of nuts. 64 ounces of water per day is generally recommended, unless you have specific health conditions, like heart failure or kidney failure where you will need to limit fluid intake.  3. Commitment to sufficient and a  good quality of physical and mental rest daily, generally between 6 to 8 hours per day.  WITH PERSISTANCE AND PERSEVERANCE, THE IMPOSSIBLE , BECOMES THE NORM!

## 2016-06-02 DIAGNOSIS — Z23 Encounter for immunization: Secondary | ICD-10-CM | POA: Diagnosis not present

## 2016-06-03 ENCOUNTER — Telehealth: Payer: Self-pay

## 2016-06-03 LAB — CYTOLOGY - PAP: Diagnosis: NEGATIVE

## 2016-06-03 NOTE — Telephone Encounter (Signed)
Called Brandy Dean, aware of pap results.

## 2016-06-03 NOTE — Telephone Encounter (Signed)
-----   Message from Kerri PerchesMargaret E Simpson, MD sent at 06/03/2016 10:31 AM EST ----- Please advise the patient that pap is normal.

## 2016-06-07 ENCOUNTER — Other Ambulatory Visit: Payer: Self-pay | Admitting: Family Medicine

## 2016-06-07 DIAGNOSIS — I10 Essential (primary) hypertension: Secondary | ICD-10-CM

## 2016-06-08 ENCOUNTER — Other Ambulatory Visit: Payer: Self-pay

## 2016-06-08 DIAGNOSIS — I10 Essential (primary) hypertension: Secondary | ICD-10-CM

## 2016-06-08 MED ORDER — TRIAMTERENE-HCTZ 75-50 MG PO TABS: 1.0000 | ORAL_TABLET | Freq: Every day | ORAL | 4 refills | Status: DC

## 2016-07-25 ENCOUNTER — Telehealth: Payer: Self-pay

## 2016-07-25 ENCOUNTER — Other Ambulatory Visit: Payer: Self-pay | Admitting: Family Medicine

## 2016-07-25 ENCOUNTER — Other Ambulatory Visit: Payer: Self-pay

## 2016-07-25 MED ORDER — BUDESONIDE-FORMOTEROL FUMARATE 160-4.5 MCG/ACT IN AERO
2.0000 | INHALATION_SPRAY | Freq: Two times a day (BID) | RESPIRATORY_TRACT | 12 refills | Status: DC
Start: 2016-07-25 — End: 2017-04-26

## 2016-07-25 NOTE — Telephone Encounter (Signed)
Change entered historically, pls notify pt and pharmacy

## 2016-07-25 NOTE — Telephone Encounter (Signed)
Medication change sent to pharmacy.  Patient aware.

## 2016-09-06 ENCOUNTER — Other Ambulatory Visit: Payer: Self-pay

## 2016-09-06 MED ORDER — HYDROCOD POLST-CPM POLST ER 10-8 MG/5ML PO SUER: 5.0000 mL | Freq: Two times a day (BID) | ORAL | 0 refills | Status: DC | PRN

## 2016-10-15 ENCOUNTER — Other Ambulatory Visit: Payer: Self-pay | Admitting: Family Medicine

## 2016-10-30 ENCOUNTER — Other Ambulatory Visit: Payer: Self-pay | Admitting: Family Medicine

## 2016-10-30 DIAGNOSIS — I10 Essential (primary) hypertension: Secondary | ICD-10-CM

## 2016-10-31 ENCOUNTER — Encounter: Payer: Self-pay | Admitting: Family Medicine

## 2016-10-31 ENCOUNTER — Ambulatory Visit: Payer: BC Managed Care – PPO | Admitting: Family Medicine

## 2017-01-16 ENCOUNTER — Ambulatory Visit: Payer: BC Managed Care – PPO | Admitting: Family Medicine

## 2017-01-24 ENCOUNTER — Ambulatory Visit: Payer: BC Managed Care – PPO | Admitting: Family Medicine

## 2017-03-08 ENCOUNTER — Other Ambulatory Visit: Payer: Self-pay | Admitting: Family Medicine

## 2017-04-17 ENCOUNTER — Ambulatory Visit: Payer: BC Managed Care – PPO | Admitting: Family Medicine

## 2017-04-17 ENCOUNTER — Encounter: Payer: Self-pay | Admitting: Family Medicine

## 2017-04-17 VITALS — BP 130/80 | HR 89 | Resp 16 | Ht 63.0 in | Wt 320.0 lb

## 2017-04-17 DIAGNOSIS — I10 Essential (primary) hypertension: Secondary | ICD-10-CM | POA: Diagnosis not present

## 2017-04-17 DIAGNOSIS — E785 Hyperlipidemia, unspecified: Secondary | ICD-10-CM

## 2017-04-17 DIAGNOSIS — Z6841 Body Mass Index (BMI) 40.0 and over, adult: Secondary | ICD-10-CM | POA: Diagnosis not present

## 2017-04-17 DIAGNOSIS — R7303 Prediabetes: Secondary | ICD-10-CM | POA: Diagnosis not present

## 2017-04-17 DIAGNOSIS — J45991 Cough variant asthma: Secondary | ICD-10-CM

## 2017-04-17 DIAGNOSIS — Z23 Encounter for immunization: Secondary | ICD-10-CM | POA: Diagnosis not present

## 2017-04-17 DIAGNOSIS — R1013 Epigastric pain: Secondary | ICD-10-CM

## 2017-04-17 MED ORDER — HYDROCOD POLST-CPM POLST ER 10-8 MG/5ML PO SUER: 5.0000 mL | Freq: Two times a day (BID) | ORAL | 0 refills | Status: DC | PRN

## 2017-04-17 NOTE — Patient Instructions (Addendum)
F/u in  5.5 months, call if you need me before  Fasting lipid, cmp, HBa1C,  And vit D and breath test in am   No more caffeine you have reflux symptoms  Still need colonoscopy  Flu vaccine  Today    Please schedule and get mammogram asap  It is important that you exercise regularly at least 30 minutes 5 times a week. If you develop chest pain, have severe difficulty breathing, or feel very tired, stop exercising immediately and seek medical attention  Please work on good  health habits so that your health will improve. 1. Commitment to daily physical activity for 30 to 60  minutes, if you are able to do this.  2. Commitment to wise food choices. Aim for half of your  food intake to be vegetable and fruit, one quarter starchy foods, and one quarter protein. Try to eat on a regular schedule  3 meals per day, snacking between meals should be limited to vegetables or fruits or small portions of nuts. 64 ounces of water per day is generally recommended, unless you have specific health conditions, like heart failure or kidney failure where you will need to limit fluid intake.  3. Commitment to sufficient and a  good quality of physical and mental rest daily, generally between 6 to 8 hours per day.  WITH PERSISTANCE AND PERSEVERANCE, THE IMPOSSIBLE , BECOMES THE NORM!

## 2017-04-21 ENCOUNTER — Other Ambulatory Visit: Payer: Self-pay | Admitting: Family Medicine

## 2017-04-21 LAB — H. PYLORI BREATH TEST: H. PYLORI BREATH TEST: NOT DETECTED

## 2017-04-21 LAB — COMPREHENSIVE METABOLIC PANEL
AG RATIO: 1.4 (calc) (ref 1.0–2.5)
ALT: 10 U/L (ref 6–29)
AST: 13 U/L (ref 10–35)
Albumin: 4.2 g/dL (ref 3.6–5.1)
Alkaline phosphatase (APISO): 52 U/L (ref 33–130)
BILIRUBIN TOTAL: 0.6 mg/dL (ref 0.2–1.2)
BUN: 15 mg/dL (ref 7–25)
CALCIUM: 10.2 mg/dL (ref 8.6–10.4)
CO2: 32 mmol/L (ref 20–32)
Chloride: 101 mmol/L (ref 98–110)
Creat: 0.71 mg/dL (ref 0.50–0.99)
Globulin: 2.9 g/dL (calc) (ref 1.9–3.7)
Glucose, Bld: 105 mg/dL — ABNORMAL HIGH (ref 65–99)
Potassium: 4 mmol/L (ref 3.5–5.3)
Sodium: 140 mmol/L (ref 135–146)
Total Protein: 7.1 g/dL (ref 6.1–8.1)

## 2017-04-21 LAB — LIPID PANEL
Cholesterol: 231 mg/dL — ABNORMAL HIGH (ref <200)
HDL: 76 mg/dL (ref >50)
LDL CHOLESTEROL (CALC): 138 mg/dL — AB
Non-HDL Cholesterol (Calc): 155 mg/dL (calc) — ABNORMAL HIGH (ref <130)
TRIGLYCERIDES: 76 mg/dL (ref <150)
Total CHOL/HDL Ratio: 3 (calc) (ref <5.0)

## 2017-04-21 LAB — HEMOGLOBIN A1C
Hgb A1c MFr Bld: 5.7 % of total Hgb — ABNORMAL HIGH (ref <5.7)
MEAN PLASMA GLUCOSE: 117 (calc)
eAG (mmol/L): 6.5 (calc)

## 2017-04-21 LAB — VITAMIN D 25 HYDROXY (VIT D DEFICIENCY, FRACTURES): VIT D 25 HYDROXY: 7 ng/mL — AB (ref 30–100)

## 2017-04-21 MED ORDER — HYDROCOD POLST-CPM POLST ER 10-8 MG/5ML PO SUER: 5.0000 mL | Freq: Two times a day (BID) | ORAL | 0 refills | Status: DC | PRN

## 2017-04-21 MED ORDER — ERGOCALCIFEROL 1.25 MG (50000 UT) PO CAPS: 50000.0000 [IU] | ORAL_CAPSULE | ORAL | 1 refills | Status: DC

## 2017-04-21 NOTE — Progress Notes (Signed)
Vit d 

## 2017-04-23 ENCOUNTER — Encounter: Payer: Self-pay | Admitting: Family Medicine

## 2017-04-23 DIAGNOSIS — R1013 Epigastric pain: Secondary | ICD-10-CM | POA: Insufficient documentation

## 2017-04-23 NOTE — Assessment & Plan Note (Signed)
Current flare requesting cough suppressant, re educated her re the need to be consistent in using her preventive medication as prescribed

## 2017-04-23 NOTE — Progress Notes (Signed)
   Brandy BreedJennifer S Dean     MRN: [id number]      DOB: 04/15/1951   HPI [name] is here for follow up and re-evaluation of chronic medical conditions, medication management and review of any available recent lab and radiology data.  Preventive health is updated, specifically  Cancer screening and Immunization.   Questions or concerns regarding consultations or procedures which the PT has had in the interim are  addressed. The PT denies any adverse reactions to current medications since the last visit.  C/o nocturnal cough, requests refill on cough suppressant syrup Also c/o choking when she tries to swallow saliva at times and excess coughing spell with this , also has dysprsia an refulsx symptoms  ROS Denies recent fever or chills. Denies sinus pressure, nasal congestion, ear pain or sore throat. Denies chest congestion, does c/o intermittent wheezing Denies chest pains, palpitations and leg swelling Denies dysuria, frequency, hesitancy or incontinence. Denies joint pain, swelling and limitation in mobility. Denies headaches, seizures, numbness, or tingling. Denies depression, anxiety or insomnia. Denies skin break down or rash.   PE  BP 130/80   Pulse 89   Resp 16   Ht 5\' 3"  (1.6 m)   Wt (!) 320 lb (145.2 kg)   SpO2 90%   BMI 56.69 kg/m   Patient alert and oriented and in no cardiopulmonary distress.  HEENT: No facial asymmetry, EOMI,   oropharynx pink and moist.  Neck supple no JVD, no mass.  Chest: Clear to auscultation bilaterally.  CVS: S1, S2 no murmurs, no S3.Regular rate.  ABD: Soft non tender.   Ext: No edema  MS: Adequate ROM spine, shoulders, hips and knees.  Skin: Intact, no ulcerations or rash noted.  Psych: Good eye contact, normal affect. Memory intact not anxious or depressed appearing.  CNS: CN 2-12 intact, power,  normal throughout.no focal deficits noted.   Assessment & Plan  Essential hypertension Controlled, no change in medication  DASH diet  and commitment to daily physical activity for a minimum of 30 minutes discussed and encouraged, as a part of hypertension management. The importance of attaining a healthy weight is also discussed.  BP/Weight 04/17/2017 06/01/2016 09/17/2015 06/25/2015 06/23/2015 04/30/2015 03/04/2015  Systolic BP 130 134 130 130 125 120 120  Diastolic BP 80 82 80 70 65 80 80  Wt. (Lbs) 320 317 315 321 287 319 317  BMI 56.69 56.15 55.81 56.88 49.24 56.52 56.17       Asthma, cough variant Current flare requesting cough suppressant, re educated her re the need to be consistent in using her preventive medication as prescribed  Need for prophylactic vaccination and inoculation against influenza After obtaining informed consent, the vaccine is  administered by LPN.   Morbid obesity with BMI of 50.0-59.9, adult Deteriorated. Patient re-educated about  the importance of commitment to a  minimum of 150 minutes of exercise per week.  The importance of healthy food choices with portion control discussed. Encouraged to start a food diary, count calories and to consider  joining a support group. Sample diet sheets offered. Goals set by the patient for the next several months.   Weight /BMI 04/17/2017 06/01/2016 09/17/2015  WEIGHT 320 lb 317 lb 315 lb  HEIGHT 5\' 3"  5\' 3"  5\' 3"   BMI 56.69 kg/m2 56.15 kg/m2 55.81 kg/m2      Dyspepsia C/o heartburn, belching, dysphagia at times. Needs to have breath test and if negative  zantac along with behavior modification recommended

## 2017-04-23 NOTE — Assessment & Plan Note (Addendum)
C/o heartburn, belching, dysphagia at times. Needs to have breath test and if negative  zantac along with behavior modification recommended

## 2017-04-23 NOTE — Assessment & Plan Note (Signed)
After obtaining informed consent, the vaccine is  administered by LPN.  

## 2017-04-23 NOTE — Assessment & Plan Note (Signed)
Controlled, no change in medication  DASH diet and commitment to daily physical activity for a minimum of 30 minutes discussed and encouraged, as a part of hypertension management. The importance of attaining a healthy weight is also discussed.  BP/Weight 04/17/2017 06/01/2016 09/17/2015 06/25/2015 06/23/2015 04/30/2015 03/04/2015  Systolic BP 130 134 130 130 125 120 120  Diastolic BP 80 82 80 70 65 80 80  Wt. (Lbs) 320 317 315 321 287 319 317  BMI 56.69 56.15 55.81 56.88 49.24 56.52 56.17

## 2017-04-23 NOTE — Assessment & Plan Note (Signed)
Deteriorated. Patient re-educated about  the importance of commitment to a  minimum of 150 minutes of exercise per week.  The importance of healthy food choices with portion control discussed. Encouraged to start a food diary, count calories and to consider  joining a support group. Sample diet sheets offered. Goals set by the patient for the next several months.   Weight /BMI 04/17/2017 06/01/2016 09/17/2015  WEIGHT 320 lb 317 lb 315 lb  HEIGHT 5\' 3"  5\' 3"  5\' 3"   BMI 56.69 kg/m2 56.15 kg/m2 55.81 kg/m2

## 2017-04-26 ENCOUNTER — Other Ambulatory Visit: Payer: Self-pay

## 2017-04-26 DIAGNOSIS — I10 Essential (primary) hypertension: Secondary | ICD-10-CM

## 2017-04-26 MED ORDER — ERGOCALCIFEROL 1.25 MG (50000 UT) PO CAPS: 50000.0000 [IU] | ORAL_CAPSULE | ORAL | 1 refills | Status: DC

## 2017-04-26 MED ORDER — BUDESONIDE-FORMOTEROL FUMARATE 160-4.5 MCG/ACT IN AERO: 2.0000 | INHALATION_SPRAY | Freq: Two times a day (BID) | RESPIRATORY_TRACT | 11 refills | Status: DC

## 2017-04-26 MED ORDER — ALBUTEROL SULFATE 1.25 MG/3ML IN NEBU: 1.0000 | INHALATION_SOLUTION | Freq: Four times a day (QID) | RESPIRATORY_TRACT | 5 refills | Status: DC | PRN

## 2017-04-26 MED ORDER — ALBUTEROL SULFATE HFA 108 (90 BASE) MCG/ACT IN AERS: 2.0000 | INHALATION_SPRAY | Freq: Four times a day (QID) | RESPIRATORY_TRACT | 5 refills | Status: DC | PRN

## 2017-04-26 MED ORDER — TRIAMTERENE-HCTZ 75-50 MG PO TABS: 1.0000 | ORAL_TABLET | Freq: Every day | ORAL | 1 refills | Status: DC

## 2017-06-15 ENCOUNTER — Other Ambulatory Visit: Payer: Self-pay | Admitting: Family Medicine

## 2017-06-15 DIAGNOSIS — Z1231 Encounter for screening mammogram for malignant neoplasm of breast: Secondary | ICD-10-CM

## 2017-06-21 ENCOUNTER — Telehealth: Payer: Self-pay | Admitting: Family Medicine

## 2017-06-21 ENCOUNTER — Ambulatory Visit (HOSPITAL_COMMUNITY)
Admission: RE | Admit: 2017-06-21 | Discharge: 2017-06-21 | Disposition: A | Discharge status: Home / Self Care | Payer: BC Managed Care – PPO | Source: Ambulatory Visit | Attending: Family Medicine | Admitting: Family Medicine

## 2017-06-21 DIAGNOSIS — Z1231 Encounter for screening mammogram for malignant neoplasm of breast: Secondary | ICD-10-CM | POA: Insufficient documentation

## 2017-06-21 NOTE — Telephone Encounter (Signed)
Pt wants a Xray of her shoulder before she is seen on Monday --states bra strap was tight

## 2017-06-23 NOTE — Telephone Encounter (Signed)
Will wait until her appt Monday

## 2017-06-26 ENCOUNTER — Ambulatory Visit (HOSPITAL_COMMUNITY)
Admission: RE | Admit: 2017-06-26 | Discharge: 2017-06-26 | Disposition: A | Discharge status: Home / Self Care | Payer: BC Managed Care – PPO | Source: Ambulatory Visit | Attending: Family Medicine | Admitting: Family Medicine

## 2017-06-26 ENCOUNTER — Other Ambulatory Visit: Payer: Self-pay

## 2017-06-26 ENCOUNTER — Ambulatory Visit (INDEPENDENT_AMBULATORY_CARE_PROVIDER_SITE_OTHER): Payer: BC Managed Care – PPO | Admitting: Family Medicine

## 2017-06-26 ENCOUNTER — Encounter: Payer: Self-pay | Admitting: Family Medicine

## 2017-06-26 VITALS — BP 130/80 | HR 60 | Temp 98.9°F | Resp 16 | Ht 63.0 in | Wt 315.5 lb

## 2017-06-26 DIAGNOSIS — Z6841 Body Mass Index (BMI) 40.0 and over, adult: Secondary | ICD-10-CM

## 2017-06-26 DIAGNOSIS — R7303 Prediabetes: Secondary | ICD-10-CM

## 2017-06-26 DIAGNOSIS — R0602 Shortness of breath: Secondary | ICD-10-CM

## 2017-06-26 DIAGNOSIS — G8929 Other chronic pain: Secondary | ICD-10-CM

## 2017-06-26 DIAGNOSIS — J45991 Cough variant asthma: Secondary | ICD-10-CM

## 2017-06-26 DIAGNOSIS — I700 Atherosclerosis of aorta: Secondary | ICD-10-CM | POA: Insufficient documentation

## 2017-06-26 DIAGNOSIS — R9431 Abnormal electrocardiogram [ECG] [EKG]: Secondary | ICD-10-CM

## 2017-06-26 DIAGNOSIS — E785 Hyperlipidemia, unspecified: Secondary | ICD-10-CM | POA: Diagnosis not present

## 2017-06-26 DIAGNOSIS — M25512 Pain in left shoulder: Secondary | ICD-10-CM

## 2017-06-26 DIAGNOSIS — M19012 Primary osteoarthritis, left shoulder: Secondary | ICD-10-CM | POA: Insufficient documentation

## 2017-06-26 DIAGNOSIS — I259 Chronic ischemic heart disease, unspecified: Secondary | ICD-10-CM

## 2017-06-26 DIAGNOSIS — I10 Essential (primary) hypertension: Secondary | ICD-10-CM | POA: Diagnosis not present

## 2017-06-26 DIAGNOSIS — E8881 Metabolic syndrome: Secondary | ICD-10-CM | POA: Diagnosis not present

## 2017-06-26 DIAGNOSIS — M25519 Pain in unspecified shoulder: Secondary | ICD-10-CM | POA: Insufficient documentation

## 2017-06-26 DIAGNOSIS — R9389 Abnormal findings on diagnostic imaging of other specified body structures: Secondary | ICD-10-CM | POA: Diagnosis not present

## 2017-06-26 MED ORDER — ATORVASTATIN CALCIUM 20 MG PO TABS: 20.0000 mg | ORAL_TABLET | Freq: Every day | ORAL | 5 refills | Status: DC

## 2017-06-26 NOTE — Assessment & Plan Note (Signed)
Improved Patient re-educated about  the importance of commitment to a  minimum of 150 minutes of exercise per week.  The importance of healthy food choices with portion control discussed. Encouraged to start a food diary, count calories and to consider  joining a support group. Sample diet sheets offered. Goals set by the patient for the next several months.   Weight /BMI 06/26/2017 04/17/2017 06/01/2016  WEIGHT 315 lb 8 oz 320 lb 317 lb  HEIGHT 5\' 3"  5\' 3"  5\' 3"   BMI 55.89 kg/m2 56.69 kg/m2 56.15 kg/m2

## 2017-06-26 NOTE — Assessment & Plan Note (Signed)
Improved Patient educated about the importance of limiting  Carbohydrate intake , the need to commit to daily physical activity for a minimum of 30 minutes , and to commit weight loss. The fact that changes in all these areas will reduce or eliminate all together the development of diabetes is stressed.   Diabetic Labs Latest Ref Rng & Units 04/20/2017 05/30/2016 07/16/2015 06/23/2015 03/04/2015  HbA1c <5.7 % of total Hgb 5.7(H) 5.7(H) 6.2(H) - 6.0(H)  Chol <200 mg/dL 098(J231(H) 191(Y208(H) - - -  HDL >50 mg/dL 76 69 - - -  Calc LDL <782<100 mg/dL - 956(O127(H) - - -  Triglycerides <150 mg/dL 76 62 - - -  Creatinine 0.50 - 0.99 mg/dL 1.300.71 8.650.78 7.840.60 6.960.80 2.950.80   BP/Weight 06/26/2017 04/17/2017 06/01/2016 09/17/2015 06/25/2015 06/23/2015 04/30/2015  Systolic BP 130 130 134 130 130 125 120  Diastolic BP 80 80 82 80 70 65 80  Wt. (Lbs) 315.5 320 317 315 321 287 319  BMI 55.89 56.69 56.15 55.81 56.88 49.24 56.52   No flowsheet data found.  Updated lab needed at/ before next visit.

## 2017-06-26 NOTE — Patient Instructions (Addendum)
F/u in 4 month, call if you need me sooner  Fasting lipid, cmp and eGFr ,  And hBA1C 1 week before follow up  EKG today due to new chest pain and you are being referred to cardiology  CXR today for shortness of breath and x ray of your left shoulder  New for cholesterol is atorvastatin 20 mg one at bedtime sent to [facility], and you need  reduce fat in diet to reduce heart disease risk  Please work on good  health habits so that your health will improve. 1. Commitment to daily physical activity for 30 to 60  minutes, if you are able to do this.  2. Commitment to wise food choices. Aim for half of your  food intake to be vegetable and fruit, one quarter starchy foods, and one quarter protein. Try to eat on a regular schedule  3 meals per day, snacking between meals should be limited to vegetables or fruits or small portions of nuts. 64 ounces of water per day is generally recommended, unless you have specific health conditions, like heart failure or kidney failure where you will need to limit fluid intake.  3. Commitment to sufficient and a  good quality of physical and mental rest daily, generally between 6 to 8 hours per day.  WITH PERSISTANCE AND PERSEVERANCE, THE IMPOSSIBLE , BECOMES THE NORM! It is important that you exercise regularly at least 30 minutes 5 times a week. If you develop chest pain, have severe difficulty breathing, or feel very tired, stop exercising immediately and seek medical attention  Thank you  for choosing [facility]. We consider it a privelige to serve you.  Delivering excellent health care in a caring and  compassionate way is our goal.  Partnering with you,  so that together we can achieve this goal is our strategy.

## 2017-06-26 NOTE — Assessment & Plan Note (Signed)
Uncontrolled, non compliant with medication, under dosing, using symbicort once daily,  not twice daily as prescribed, educated re correct dosing

## 2017-06-26 NOTE — Assessment & Plan Note (Signed)
4 month h/o increased chest pain and exertional fatigue. EKG in office today and in the past abnormal. Did not complete cardiology evaluation. 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

## 2017-06-26 NOTE — Assessment & Plan Note (Signed)
Cough , sputum and SOB, h/o pneumonia, CXR to evaluate lungs

## 2017-06-26 NOTE — Assessment & Plan Note (Signed)
Controlled, no change in medication DASH diet and commitment to daily physical activity for a minimum of 30 minutes discussed and encouraged, as a part of hypertension management. The importance of attaining a healthy weight is also discussed.  BP/Weight 06/26/2017 04/17/2017 06/01/2016 09/17/2015 06/25/2015 06/23/2015 04/30/2015  Systolic BP 130 130 134 130 130 125 120  Diastolic BP 80 80 82 80 70 65 80  Wt. (Lbs) 315.5 320 317 315 321 287 319  BMI 55.89 56.69 56.15 55.81 56.88 49.24 56.52

## 2017-06-26 NOTE — Assessment & Plan Note (Signed)
Not at goal and increased CV risk. Reduce fatty foods and start lipitor 20 mg daily  Updated lab needed at/ before next visit. Hyperlipidemia:Low fat diet discussed and encouraged.   Lipid Panel  Lab Results  Component Value Date   CHOL 231 (H) 04/20/2017   HDL 76 04/20/2017   LDLCALC 127 (H) 05/30/2016   TRIG 76 04/20/2017   CHOLHDL 3.0 04/20/2017

## 2017-06-26 NOTE — Progress Notes (Signed)
Brandy BreedJennifer S Dean     MRN: [id number]      DOB: 11/17/1950   HPI [name] is here for follow up and re-evaluation of chronic medical conditions, medication management and review of any available recent lab and radiology data.   The PT denies any adverse reactions to current medications since the last visit.  4 month h/o increased shortness of breath with activity, chest pain sometimes radiates down left arm States symbicort not as effective as ion the past , but using once not twice daily  2 week h/o tender swelling on left shoulder  c/o weight gain will work on this Needs colonoscopy still , putting this off at this time   ROS C/o recent cough and clear sputum last week which has subsequently cleared Denies sinus pressure, nasal congestion, ear pain or sore throat. Denies chest congestion, productive cough or wheezing.  Denies abdominal pain, nausea, vomiting,diarrhea or constipation.   Denies dysuria, frequency, hesitancy or incontinence.  Denies headaches, seizures, numbness, or tingling. Denies depression, anxiety or insomnia. Denies skin break down or rash.   PE  BP 130/80 (BP Location: Left Arm, Patient Position: Sitting, Cuff Size: Normal)   Pulse 60   Temp 98.9 F (37.2 C) (Temporal)   Resp 16   Ht 5\' 3"  (1.6 m)   Wt (!) 315 lb 8 oz (143.1 kg)   SpO2 95%   BMI 55.89 kg/m   Patient alert and oriented and in no cardiopulmonary distress.  HEENT: No facial asymmetry, EOMI,   oropharynx pink and moist.  Neck supple no JVD, no mass.  Chest: Clear to auscultation bilaterally.Decreased air entry throughout  CVS: S1, S2 no murmurs, no S3.Regular rate. EKG; LVH, incomplete RBB and septal infarct, needs further cardiology evaluation ABD: Soft non tender.   Ext: No edema  MS: Adequate ROM spine,  Decreased in left shoulder with palpable bony swelling , tender to palpation,  Adequate though reduced in hips and knees.  Skin: Intact, no ulcerations or rash  noted.  Psych: Good eye contact, normal affect. Memory intact not anxious or depressed appearing.  CNS: CN 2-12 intact, power,  normal throughout.no focal deficits noted.   Assessment & Plan  Essential hypertension Controlled, no change in medication DASH diet and commitment to daily physical activity for a minimum of 30 minutes discussed and encouraged, as a part of hypertension management. The importance of attaining a healthy weight is also discussed.  BP/Weight 06/26/2017 04/17/2017 06/01/2016 09/17/2015 06/25/2015 06/23/2015 04/30/2015  Systolic BP 130 130 134 130 130 125 120  Diastolic BP 80 80 82 80 70 65 80  Wt. (Lbs) 315.5 320 317 315 321 287 319  BMI 55.89 56.69 56.15 55.81 56.88 49.24 56.52       Asthma, cough variant Uncontrolled, non compliant with medication, under dosing, using symbicort once daily,  not twice daily as prescribed, educated re correct dosing  Morbid obesity with BMI of 50.0-59.9, adult Improved Patient re-educated about  the importance of commitment to a  minimum of 150 minutes of exercise per week.  The importance of healthy food choices with portion control discussed. Encouraged to start a food diary, count calories and to consider  joining a support group. Sample diet sheets offered. Goals set by the patient for the next several months.   Weight /BMI 06/26/2017 04/17/2017 06/01/2016  WEIGHT 315 lb 8 oz 320 lb 317 lb  HEIGHT 5\' 3"  5\' 3"  5\' 3"   BMI 55.89 kg/m2 56.69 kg/m2 56.15 kg/m2  Hyperlipidemia with target LDL less than 100 Not at goal and increased CV risk. Reduce fatty foods and start lipitor 20 mg daily  Updated lab needed at/ before next visit. Hyperlipidemia:Low fat diet discussed and encouraged.   Lipid Panel  Lab Results  Component Value Date   CHOL 231 (H) 04/20/2017   HDL 76 04/20/2017   LDLCALC 127 (H) 05/30/2016   TRIG 76 04/20/2017   CHOLHDL 3.0 04/20/2017       Shortness of breath on exertion Cough , sputum  and SOB, h/o pneumonia, CXR to evaluate lungs  Prediabetes Improved Patient educated about the importance of limiting  Carbohydrate intake , the need to commit to daily physical activity for a minimum of 30 minutes , and to commit weight loss. The fact that changes in all these areas will reduce or eliminate all together the development of diabetes is stressed.   Diabetic Labs Latest Ref Rng & Units 04/20/2017 05/30/2016 07/16/2015 06/23/2015 03/04/2015  HbA1c <5.7 % of total Hgb 5.7(H) 5.7(H) 6.2(H) - 6.0(H)  Chol <200 mg/dL 409(W) 119(J) - - -  HDL >50 mg/dL 76 69 - - -  Calc LDL <478 mg/dL - 295(A) - - -  Triglycerides <150 mg/dL 76 62 - - -  Creatinine 0.50 - 0.99 mg/dL 2.13 0.86 5.78 4.69 6.29   BP/Weight 06/26/2017 04/17/2017 06/01/2016 09/17/2015 06/25/2015 06/23/2015 04/30/2015  Systolic BP 130 130 134 130 130 125 120  Diastolic BP 80 80 82 80 70 65 80  Wt. (Lbs) 315.5 320 317 315 321 287 319  BMI 55.89 56.69 56.15 55.81 56.88 49.24 56.52   No flowsheet data found.  Updated lab needed at/ before next visit.   Chest pain 4 month h/o increased chest pain and exertional fatigue. EKG in office today and in the past abnormal. Did not complete cardiology evaluation. 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

## 2017-08-02 ENCOUNTER — Ambulatory Visit: Payer: BC Managed Care – PPO | Admitting: Family Medicine

## 2017-08-09 ENCOUNTER — Encounter: Payer: Self-pay | Admitting: Cardiovascular Disease

## 2017-09-07 ENCOUNTER — Telehealth: Payer: Self-pay

## 2017-09-07 ENCOUNTER — Other Ambulatory Visit: Payer: Self-pay | Admitting: Family Medicine

## 2017-09-07 NOTE — Telephone Encounter (Signed)
Called patient. Home phone busy.  Did not answer cell. Letter was mailed notifying her of referral

## 2017-09-07 NOTE — Telephone Encounter (Signed)
Patient states she had requested PT for her left shoulder instead or orthopedic referral. Is it ok to refer her for this?

## 2017-09-07 NOTE — Telephone Encounter (Signed)
I tried to call back and had to  Leave her a message  To call back to speak with the nurse. Since the x ray of her shoulder shows so much arthritis and a possible torn rotator cuff tear, I recommended an orthopedic Doc supervise her treatment. If she feels strongly that she only wants to start  with therapy and see how she does with that , then she may do so. I have referred her to PT in [place] through the hospital

## 2017-09-28 ENCOUNTER — Encounter: Payer: Self-pay | Admitting: Cardiovascular Disease

## 2017-09-28 ENCOUNTER — Ambulatory Visit: Payer: BC Managed Care – PPO | Admitting: Cardiovascular Disease

## 2017-09-28 VITALS — BP 138/84 | HR 68 | Ht 64.0 in | Wt 318.0 lb

## 2017-09-28 DIAGNOSIS — E782 Mixed hyperlipidemia: Secondary | ICD-10-CM

## 2017-09-28 DIAGNOSIS — R011 Cardiac murmur, unspecified: Secondary | ICD-10-CM

## 2017-09-28 DIAGNOSIS — R0609 Other forms of dyspnea: Secondary | ICD-10-CM | POA: Diagnosis not present

## 2017-09-28 DIAGNOSIS — R079 Chest pain, unspecified: Secondary | ICD-10-CM

## 2017-09-28 DIAGNOSIS — I10 Essential (primary) hypertension: Secondary | ICD-10-CM

## 2017-09-28 NOTE — Patient Instructions (Signed)
Your physician recommends that you schedule a follow-up appointment in:  6-8 weeks with Dr.Koneswaran    Your physician recommends that you continue on your current medications as directed. Please refer to the Current Medication list given to you today.     Your physician has requested that you have a lexiscan myoview. For further information please visit [email or web address]. Please follow instruction sheet, as given.    Your physician has requested that you have an echocardiogram. Echocardiography is a painless test that uses sound waves to create images of your heart. It provides your doctor with information about the size and shape of your heart and how well your heart's chambers and valves are working. This procedure takes approximately one hour. There are no restrictions for this procedure.     No lab work ordered today         Thank you for choosing  [facility] !

## 2017-09-28 NOTE — Progress Notes (Signed)
CARDIOLOGY CONSULT NOTE  Patient ID: AFTYN NOTT MRN: [id number] DOB/AGE: 10-20-1950 67 y.o.  Admit date: (Not on file) Primary Physician: Kerri Perches, MD Referring Physician: Kerri Perches, MD  Reason for Consultation: Chest pain  HPI: Brandy Dean is a 67 y.o. female who is being seen today for the evaluation of chest pain at the request of Kerri Perches, MD.   Past medical history includes hypertension, hypercholesterolemia, prediabetes, and morbid obesity.  I reviewed notes, labs, and studies from her PCP.  When she saw her on 06/26/17, she had complained of a 13-month history of increasing chest pain and exertional fatigue.  I reviewed the chest x-ray performed on 06/26/17 which showed no evidence of infiltrate or CHF with top normal heart size and aortic atherosclerosis.  It appears she underwent a nondiagnostic stress echocardiogram in December 2012.  I personally reviewed the ECG performed on 06/26/17 which showed sinus bradycardia, 57 bpm, incomplete right bundle branch block, Q waves in V2, and borderline LVH.  Lipids 04/20/17: Total cholesterol 231, HDL 76, triglycerides 76, LDL 138.  She told me that she began experiencing symptoms in January.  She had chest pain rest while sitting and lying down.  Each episode would last a few minutes.  It would radiate into the left arm and left neck on occasion.  She had less intense symptoms in February and March but has not had any in April.  She does have exertional dyspnea.  She has had no relief with Symbicort.  She told me she used to be on oxygen years ago.  She denies palpitations, leg swelling, orthopnea, and paroxysmal nocturnal dyspnea.   Allergies  Allergen Reactions  . Benzonatate Shortness Of Breath    TRIGGERS ASTHMA ATTACK    Current Outpatient Medications  Medication Sig Dispense Refill  . albuterol (ACCUNEB) 1.25 MG/3ML nebulizer solution Take 3 mLs (1.25 mg total) every 6 (six)  hours as needed by nebulization for wheezing. 75 mL 5  . albuterol (PROVENTIL HFA;VENTOLIN HFA) 108 (90 Base) MCG/ACT inhaler Inhale 2 puffs every 6 (six) hours as needed into the lungs for wheezing or shortness of breath. 1 Inhaler 5  . atorvastatin (LIPITOR) 20 MG tablet Take 1 tablet (20 mg total) by mouth daily. 30 tablet 5  . budesonide-formoterol (SYMBICORT) 160-4.5 MCG/ACT inhaler Inhale 2 puffs 2 (two) times daily into the lungs. 1 Inhaler 11  . clotrimazole-betamethasone (LOTRISONE) cream APPLY TO AFFECTED AREA TWICE A DAY 45 g 1  . ergocalciferol (VITAMIN D2) 50000 units capsule Take 1 capsule (50,000 Units total) once a week by mouth. One capsule once weekly 12 capsule 1  . triamterene-hydrochlorothiazide (MAXZIDE) 75-50 MG tablet Take 1 tablet daily by mouth. 90 tablet 1  . chlorpheniramine-HYDROcodone (TUSSIONEX PENNKINETIC ER) 10-8 MG/5ML SUER Take 5 mLs every 12 (twelve) hours as needed by mouth for cough. (Patient not taking: Reported on 09/28/2017) 115 mL 0   No current facility-administered medications for this visit.     Past Medical History:  Diagnosis Date  . Bacterial pneumonia    March  9-12,  2011  . Bronchial asthma   . Essential hypertension, benign   . Morbid obesity (HCC)   . Sleep apnea     Past Surgical History:  Procedure Laterality Date  . CESAREAN SECTION  1988 & 1991  . Partial left lobectomy  1968  . RIght and left keloid ears  1968    Social History   Socioeconomic  History  . Marital status: Legally Separated    Spouse name: Not on file  . Number of children: 3  . Years of education: Not on file  . Highest education level: Not on file  Occupational History    [employer]er: [employer]  Social Needs  . Financial resource strain: Not on file  . Food insecurity:    Worry: Not on file    Inability: Not on file  . Transportation needs:    Medical: Not on file    Non-medical: Not on file  Tobacco Use  . Smoking status: Former  Smoker    Types: Cigarettes    Last attempt to quit: 06/14/2007    Years since quitting: 10.2  . Smokeless tobacco: Never Used  Substance and Sexual Activity  . Alcohol use: No  . Drug use: No  . Sexual activity: Not on file  Lifestyle  . Physical activity:    Days per week: Not on file    Minutes per session: Not on file  . Stress: Not on file  Relationships  . Social connections:    Talks on phone: Not on file    Gets together: Not on file    Attends religious service: Not on file    Active member of club or organization: Not on file    Attends meetings of clubs or organizations: Not on file    Relationship status: Not on file  . Intimate partner violence:    Fear of current or ex partner: Not on file    Emotionally abused: Not on file    Physically abused: Not on file    Forced sexual activity: Not on file  Other Topics Concern  . Not on file  Social History Narrative  . Not on file     No family history of premature CAD in 1st degree relatives.  Current Meds  Medication Sig  . albuterol (ACCUNEB) 1.25 MG/3ML nebulizer solution Take 3 mLs (1.25 mg total) every 6 (six) hours as needed by nebulization for wheezing.  Marland Kitchen. albuterol (PROVENTIL HFA;VENTOLIN HFA) 108 (90 Base) MCG/ACT inhaler Inhale 2 puffs every 6 (six) hours as needed into the lungs for wheezing or shortness of breath.  Marland Kitchen. atorvastatin (LIPITOR) 20 MG tablet Take 1 tablet (20 mg total) by mouth daily.  . budesonide-formoterol (SYMBICORT) 160-4.5 MCG/ACT inhaler Inhale 2 puffs 2 (two) times daily into the lungs.  . clotrimazole-betamethasone (LOTRISONE) cream APPLY TO AFFECTED AREA TWICE A DAY  . ergocalciferol (VITAMIN D2) 50000 units capsule Take 1 capsule (50,000 Units total) once a week by mouth. One capsule once weekly  . triamterene-hydrochlorothiazide (MAXZIDE) 75-50 MG tablet Take 1 tablet daily by mouth.      Review of systems complete and found to be negative unless listed above in  HPI    Physical exam Blood pressure 138/84, pulse 68, height 5\' 4"  (1.626 m), weight (!) 318 lb (144.2 kg), SpO2 94 %. General: NAD Neck: No JVD, no thyromegaly or thyroid nodule.  Lungs: Clear to auscultation bilaterally with normal respiratory effort. CV: Nondisplaced PMI. Regular rate and rhythm, normal S1/S2, no S3/S4, 2/6 systolic murmur appreciated over bilateral upper sternal borders.  No peripheral edema.  No carotid bruit.   Abdomen: Soft, nontender, no distention.  Skin: Intact without lesions or rashes.  Neurologic: Alert and oriented x 3.  Psych: Normal affect. Extremities: No clubbing or cyanosis.  HEENT: Normal.   ECG: Most recent ECG reviewed.   Labs: Lab Results  Component Value  Date/Time   K 4.0 04/20/2017 08:28 AM   BUN 15 04/20/2017 08:28 AM   CREATININE 0.71 04/20/2017 08:28 AM   ALT 10 04/20/2017 08:28 AM   TSH 1.76 05/30/2016 08:32 AM   HGB 12.9 07/16/2015 08:55 AM     Lipids: Lab Results  Component Value Date/Time   LDLCALC 138 (H) 04/20/2017 08:28 AM   CHOL 231 (H) 04/20/2017 08:28 AM   TRIG 76 04/20/2017 08:28 AM   HDL 76 04/20/2017 08:28 AM        ASSESSMENT AND PLAN:   1.  Chest pain and exertional dyspnea: While chest pain symptoms have subsided this month, she continues to have progressive exertional dyspnea.  Prior symptoms were alleviated with Symbicort but no longer.  Chest x-ray did not show any significant abnormalities.  Cardiovascular risk factors include hypertension, hyperlipidemia, and morbid obesity. I will proceed with a nuclear myocardial perfusion imaging study to evaluate for ischemic heart disease (Lexiscan Myoview).  I will also obtain an echocardiogram to evaluate cardiac structure and function.  2.  Hypertension: Blood pressure is normal.  No changes to therapy.  3.  Hypercholesterolemia: Lipids reviewed above.  She forgot to pick up her statin prescription but will do so.  4. Cardiac murmur: I will obtain an  echocardiogram to evaluate cardiac structure and function.   Disposition: Follow up in 6-8 weeks.  Signed: Prentice Docker, M.D., F.A.C.C.  09/28/2017, 1:31 PM

## 2017-10-03 ENCOUNTER — Ambulatory Visit (HOSPITAL_BASED_OUTPATIENT_CLINIC_OR_DEPARTMENT_OTHER)
Admission: RE | Admit: 2017-10-03 | Discharge: 2017-10-03 | Disposition: A | Discharge status: Home / Self Care | Payer: BC Managed Care – PPO | Source: Ambulatory Visit | Attending: Cardiovascular Disease | Admitting: Cardiovascular Disease

## 2017-10-03 ENCOUNTER — Encounter (HOSPITAL_COMMUNITY)
Admission: RE | Admit: 2017-10-03 | Discharge: 2017-10-03 | Disposition: A | Discharge status: Home / Self Care | Payer: BC Managed Care – PPO | Source: Ambulatory Visit | Attending: Cardiovascular Disease | Admitting: Cardiovascular Disease

## 2017-10-03 ENCOUNTER — Ambulatory Visit (HOSPITAL_COMMUNITY)
Admission: RE | Admit: 2017-10-03 | Discharge: 2017-10-03 | Disposition: A | Discharge status: Home / Self Care | Payer: BC Managed Care – PPO | Source: Ambulatory Visit | Attending: Cardiovascular Disease | Admitting: Cardiovascular Disease

## 2017-10-03 ENCOUNTER — Encounter (HOSPITAL_COMMUNITY): Payer: Self-pay

## 2017-10-03 DIAGNOSIS — R079 Chest pain, unspecified: Secondary | ICD-10-CM | POA: Diagnosis present

## 2017-10-03 DIAGNOSIS — I209 Angina pectoris, unspecified: Secondary | ICD-10-CM | POA: Insufficient documentation

## 2017-10-03 DIAGNOSIS — R011 Cardiac murmur, unspecified: Secondary | ICD-10-CM | POA: Diagnosis not present

## 2017-10-03 DIAGNOSIS — Z87891 Personal history of nicotine dependence: Secondary | ICD-10-CM | POA: Insufficient documentation

## 2017-10-03 DIAGNOSIS — E785 Hyperlipidemia, unspecified: Secondary | ICD-10-CM | POA: Diagnosis not present

## 2017-10-03 DIAGNOSIS — I119 Hypertensive heart disease without heart failure: Secondary | ICD-10-CM | POA: Diagnosis not present

## 2017-10-03 DIAGNOSIS — R0609 Other forms of dyspnea: Secondary | ICD-10-CM | POA: Insufficient documentation

## 2017-10-03 LAB — NM MYOCAR MULTI W/SPECT W/WALL MOTION / EF
CHL CUP NUCLEAR SRS: 0
CHL CUP NUCLEAR SSS: 1
LHR: 0.36
LV dias vol: 110 mL (ref 46–106)
LV sys vol: 32 mL
Peak HR: 85 {beats}/min
Rest HR: 63 {beats}/min
SDS: 1
TID: 1.02

## 2017-10-03 MED ORDER — TECHNETIUM TC 99M TETROFOSMIN IV KIT
10.0000 | PACK | Freq: Once | INTRAVENOUS | Status: AC | PRN
  Administered 2017-10-03: 11 via INTRAVENOUS

## 2017-10-03 MED ORDER — TECHNETIUM TC 99M TETROFOSMIN IV KIT
30.0000 | PACK | Freq: Once | INTRAVENOUS | Status: AC | PRN
  Administered 2017-10-03: 27 via INTRAVENOUS

## 2017-10-03 MED ORDER — REGADENOSON 0.4 MG/5ML IV SOLN
INTRAVENOUS | Status: AC
  Administered 2017-10-03: 0.4 mg via INTRAVENOUS
  Filled 2017-10-03: qty 5

## 2017-10-03 MED ORDER — SODIUM CHLORIDE 0.9% FLUSH
INTRAVENOUS | Status: AC
  Administered 2017-10-03: 10 mL via INTRAVENOUS
  Filled 2017-10-03: qty 10

## 2017-10-03 NOTE — Progress Notes (Signed)
*  PRELIMINARY RESULTS* Echocardiogram 2D Echocardiogram has been performed.  Jeryl Columbialliott, Deshannon Seide 10/03/2017, 11:15 AM

## 2017-10-31 ENCOUNTER — Ambulatory Visit (INDEPENDENT_AMBULATORY_CARE_PROVIDER_SITE_OTHER): Payer: BC Managed Care – PPO | Admitting: Family Medicine

## 2017-10-31 ENCOUNTER — Encounter: Payer: Self-pay | Admitting: Family Medicine

## 2017-10-31 VITALS — BP 128/84 | HR 60 | Resp 16 | Ht 64.0 in | Wt 315.0 lb

## 2017-10-31 DIAGNOSIS — R7303 Prediabetes: Secondary | ICD-10-CM

## 2017-10-31 DIAGNOSIS — J45991 Cough variant asthma: Secondary | ICD-10-CM | POA: Diagnosis not present

## 2017-10-31 DIAGNOSIS — I10 Essential (primary) hypertension: Secondary | ICD-10-CM

## 2017-10-31 DIAGNOSIS — Z23 Encounter for immunization: Secondary | ICD-10-CM

## 2017-10-31 DIAGNOSIS — E785 Hyperlipidemia, unspecified: Secondary | ICD-10-CM

## 2017-10-31 DIAGNOSIS — Z780 Asymptomatic menopausal state: Secondary | ICD-10-CM | POA: Diagnosis not present

## 2017-10-31 DIAGNOSIS — Z6841 Body Mass Index (BMI) 40.0 and over, adult: Secondary | ICD-10-CM | POA: Diagnosis not present

## 2017-10-31 MED ORDER — HYDROCOD POLST-CPM POLST ER 10-8 MG/5ML PO SUER: 5.0000 mL | Freq: Two times a day (BID) | ORAL | 0 refills | Status: DC | PRN

## 2017-10-31 MED ORDER — PNEUMOCOCCAL 13-VAL CONJ VACC IM SUSP: 0.5000 mL | INTRAMUSCULAR | Status: DC

## 2017-10-31 NOTE — Patient Instructions (Addendum)
Annual physical exam in 5 months, call if you need me sooner  Please schedule bone density test  Prevnar today  Cough syrup is sent to [facility]   Please call and let me know who you want to be referred to for the colonoscopy which you need  CBC, fasting lipid, cmp and EGFR , vit D, hBA1C and tSH in the next 1 week please  Please work on good  health habits so that your health will improve. 1. Commitment to daily physical activity for 30 to 60  minutes, if you are able to do this.  2. Commitment to wise food choices. Aim for half of your  food intake to be vegetable and fruit, one quarter starchy foods, and one quarter protein. Try to eat on a regular schedule  3 meals per day, snacking between meals should be limited to vegetables or fruits or small portions of nuts. 64 ounces of water per day is generally recommended, unless you have specific health conditions, like heart failure or kidney failure where you will need to limit fluid intake.  3. Commitment to sufficient and a  good quality of physical and mental rest daily, generally between 6 to 8 hours per day.  WITH PERSISTANCE AND PERSEVERANCE, THE IMPOSSIBLE , BECOMES THE NORM! It is important that you exercise regularly at least 30 minutes 5 times a week. If you develop chest pain, have severe difficulty breathing, or feel very tired, stop exercising immediately and seek medical attention

## 2017-11-01 ENCOUNTER — Other Ambulatory Visit: Payer: Self-pay | Admitting: Family Medicine

## 2017-11-01 DIAGNOSIS — I10 Essential (primary) hypertension: Secondary | ICD-10-CM

## 2017-11-06 ENCOUNTER — Encounter: Payer: Self-pay | Admitting: Family Medicine

## 2017-11-06 DIAGNOSIS — Z23 Encounter for immunization: Secondary | ICD-10-CM | POA: Insufficient documentation

## 2017-11-06 NOTE — Assessment & Plan Note (Signed)
Patient educated about the importance of limiting  Carbohydrate intake , the need to commit to daily physical activity for a minimum of 30 minutes , and to commit weight loss. The fact that changes in all these areas will reduce or eliminate all together the development of diabetes is stressed.  Updated lab needed at/ before next visit.   Diabetic Labs Latest Ref Rng & Units 04/20/2017 05/30/2016 07/16/2015 06/23/2015 03/04/2015  HbA1c <5.7 % of total Hgb 5.7(H) 5.7(H) 6.2(H) - 6.0(H)  Chol <200 mg/dL 782(N) 562(Z) - - -  HDL >50 mg/dL 76 69 - - -  Calc LDL mg/dL (calc) 308(M) 578(I) - - -  Triglycerides <150 mg/dL 76 62 - - -  Creatinine 0.50 - 0.99 mg/dL 6.96 2.95 2.84 1.32 4.40   BP/Weight 10/31/2017 09/28/2017 06/26/2017 04/17/2017 06/01/2016 09/17/2015 06/25/2015  Systolic BP 128 138 130 130 134 130 130  Diastolic BP 84 84 80 80 82 80 70  Wt. (Lbs) 315 318 315.5 320 317 315 321  BMI 54.07 54.58 55.89 56.69 56.15 55.81 56.88   No flowsheet data found.

## 2017-11-06 NOTE — Assessment & Plan Note (Signed)
Unchnaged. Patient re-educated about  the importance of commitment to a  minimum of 150 minutes of exercise per week.  The importance of healthy food choices with portion control discussed. Encouraged to start a food diary, count calories and to consider  joining a support group. Sample diet sheets offered. Goals set by the patient for the next several months.   Weight /BMI 10/31/2017 09/28/2017 06/26/2017  WEIGHT 315 lb 318 lb 315 lb 8 oz  HEIGHT     BMI 54.07 kg/m2 54.58 kg/m2 55.89 kg/m2

## 2017-11-06 NOTE — Assessment & Plan Note (Signed)
After obtaining informed consent, the vaccine is  administered by LPN.  

## 2017-11-06 NOTE — Assessment & Plan Note (Signed)
Controlled, no change in medication  

## 2017-11-06 NOTE — Progress Notes (Signed)
Brandy Dean     MRN: [id number]      DOB: [birth year]   HPI Brandy Dean is here for follow up and re-evaluation of chronic medical conditions, medication management and review of any available recent lab and radiology data.  Preventive health is updated, specifically  Cancer screening and Immunization.   Questions or concerns regarding consultations or procedures which the PT has had in the interim are  addressed. The PT denies any adverse reactions to current medications since the last visit.  C/o nocturnal cough intermittently and requests cough suppressant for as needed use  ROS Denies recent fever or chills. Denies sinus pressure, nasal congestion, ear pain or sore throat. Denies chest congestion, productive cough or wheezing. Denies chest pains, palpitations and leg swelling Denies abdominal pain, nausea, vomiting,diarrhea or constipation.   Denies dysuria, frequency, hesitancy or incontinence. Denies joint pain, swelling and limitation in mobility. Denies headaches, seizures, numbness, or tingling. Denies depression, anxiety or insomnia. Denies skin break down or rash.   PE  BP 128/84   Pulse 60   Resp 16   Ht  (1.626 m)   Wt (!) 315 lb (142.9 kg)   SpO2 92%   BMI 54.07 kg/m   Patient alert and oriented and in no cardiopulmonary distress.  HEENT: No facial asymmetry, EOMI,   oropharynx pink and moist.  Neck supple no JVD, no mass.  Chest: Clear to auscultation bilaterally.  CVS: S1, S2 no murmurs, no S3.Regular rate.  ABD: Soft non tender.   Ext: No edema  MS: Adequate ROM spine, shoulders, hips and knees.  Skin: Intact, no ulcerations or rash noted.  Psych: Good eye contact, normal affect. Memory intact not anxious or depressed appearing.  CNS: CN 2-12 intact, power,  normal throughout.no focal deficits noted.   Assessment & Plan  Essential hypertension Controlled, no change in medication DASH diet and commitment to daily physical activity for a  minimum of 30 minutes discussed and encouraged, as a part of hypertension management. The importance of attaining a healthy weight is also discussed.  BP/Weight 10/31/2017 09/28/2017 06/26/2017 04/17/2017 06/01/2016 09/17/2015 06/25/2015  Systolic BP 128 138 130 130 134 130 130  Diastolic BP 84 84 80 80 82 80 70  Wt. (Lbs) 315 318 315.5 320 317 315 321  BMI 54.07 54.58 55.89 56.69 56.15 55.81 56.88       Asthma, cough variant Controlled, no change in medication   Morbid obesity with BMI of 50.0-59.9, adult Unchnaged. Patient re-educated about  the importance of commitment to a  minimum of 150 minutes of exercise per week.  The importance of healthy food choices with portion control discussed. Encouraged to start a food diary, count calories and to consider  joining a support group. Sample diet sheets offered. Goals set by the patient for the next several months.   Weight /BMI 10/31/2017 09/28/2017 06/26/2017  WEIGHT 315 lb 318 lb 315 lb 8 oz  HEIGHT     BMI 54.07 kg/m2 54.58 kg/m2 55.89 kg/m2      Prediabetes Patient educated about the importance of limiting  Carbohydrate intake , the need to commit to daily physical activity for a minimum of 30 minutes , and to commit weight loss. The fact that changes in all these areas will reduce or eliminate all together the development of diabetes is stressed.  Updated lab needed at/ before next visit.   Diabetic Labs Latest Ref Rng & Units 04/20/2017 05/30/2016 07/16/2015 06/23/2015 03/04/2015  HbA1c <5.7 % of total Hgb 5.7(H) 5.7(H) 6.2(H) - 6.0(H)  Chol <200 mg/dL 161(W) 960(A) - - -  HDL >50 mg/dL 76 69 - - -  Calc LDL mg/dL (calc) 540(J) 811(B) - - -  Triglycerides <150 mg/dL 76 62 - - -  Creatinine 0.50 - 0.99 mg/dL 1.47 8.29 5.62 1.30 8.65   BP/Weight 10/31/2017 09/28/2017 06/26/2017 04/17/2017 06/01/2016 09/17/2015 06/25/2015  Systolic BP 128 138 130 130 134 130 130  Diastolic BP 84 84 80 80 82 80 70  Wt. (Lbs) 315 318 315.5  320 317 315 321  BMI 54.07 54.58 55.89 56.69 56.15 55.81 56.88   No flowsheet data found.    Need for vaccination with 13-polyvalent pneumococcal conjugate vaccine After obtaining informed consent, the vaccine is  administered by LPN.

## 2017-11-06 NOTE — Assessment & Plan Note (Signed)
Controlled, no change in medication DASH diet and commitment to daily physical activity for a minimum of 30 minutes discussed and encouraged, as a part of hypertension management. The importance of attaining a healthy weight is also discussed.  BP/Weight 10/31/2017 09/28/2017 06/26/2017 04/17/2017 06/01/2016 09/17/2015 06/25/2015  Systolic BP 128 138 130 130 134 130 130  Diastolic BP 84 84 80 80 82 80 70  Wt. (Lbs) 315 318 315.5 320 317 315 321  BMI 54.07 54.58 55.89 56.69 56.15 55.81 56.88

## 2017-11-24 ENCOUNTER — Other Ambulatory Visit (HOSPITAL_COMMUNITY): Payer: BC Managed Care – PPO

## 2017-12-04 ENCOUNTER — Other Ambulatory Visit (HOSPITAL_COMMUNITY): Payer: BC Managed Care – PPO

## 2017-12-07 ENCOUNTER — Ambulatory Visit (HOSPITAL_COMMUNITY)
Admission: RE | Admit: 2017-12-07 | Discharge: 2017-12-07 | Disposition: A | Discharge status: Home / Self Care | Payer: BC Managed Care – PPO | Source: Ambulatory Visit | Attending: Family Medicine | Admitting: Family Medicine

## 2017-12-07 ENCOUNTER — Other Ambulatory Visit: Payer: Self-pay | Admitting: Family Medicine

## 2017-12-07 DIAGNOSIS — Z780 Asymptomatic menopausal state: Secondary | ICD-10-CM

## 2017-12-08 ENCOUNTER — Other Ambulatory Visit: Payer: Self-pay | Admitting: Family Medicine

## 2017-12-08 ENCOUNTER — Ambulatory Visit: Payer: BC Managed Care – PPO | Admitting: Cardiovascular Disease

## 2017-12-08 LAB — COMPLETE METABOLIC PANEL WITH GFR
AG RATIO: 1.4 (calc) (ref 1.0–2.5)
ALBUMIN MSPROF: 4.5 g/dL (ref 3.6–5.1)
ALT: 15 U/L (ref 6–29)
AST: 24 U/L (ref 10–35)
Alkaline phosphatase (APISO): 54 U/L (ref 33–130)
BILIRUBIN TOTAL: 0.8 mg/dL (ref 0.2–1.2)
BUN: 18 mg/dL (ref 7–25)
CHLORIDE: 98 mmol/L (ref 98–110)
CO2: 31 mmol/L (ref 20–32)
Calcium: 10.4 mg/dL (ref 8.6–10.4)
Creat: 0.95 mg/dL (ref 0.50–0.99)
GFR, EST AFRICAN AMERICAN: 73 mL/min/{1.73_m2} (ref >=60)
GFR, Est Non African American: 63 mL/min/{1.73_m2} (ref >=60)
Globulin: 3.3 g/dL (calc) (ref 1.9–3.7)
Glucose, Bld: 106 mg/dL — ABNORMAL HIGH (ref 65–99)
POTASSIUM: 3.7 mmol/L (ref 3.5–5.3)
Sodium: 137 mmol/L (ref 135–146)
TOTAL PROTEIN: 7.8 g/dL (ref 6.1–8.1)

## 2017-12-08 LAB — HEMOGLOBIN A1C
EAG (MMOL/L): 6.5 (calc)
Hgb A1c MFr Bld: 5.7 % of total Hgb — ABNORMAL HIGH (ref <5.7)
Mean Plasma Glucose: 117 (calc)

## 2017-12-08 LAB — VITAMIN D 25 HYDROXY (VIT D DEFICIENCY, FRACTURES): Vit D, 25-Hydroxy: 19 ng/mL — ABNORMAL LOW (ref 30–100)

## 2017-12-08 LAB — CBC
HEMATOCRIT: 44.2 % (ref 35.0–45.0)
Hemoglobin: 14.2 g/dL (ref 11.7–15.5)
MCH: 25.9 pg — ABNORMAL LOW (ref 27.0–33.0)
MCHC: 32.1 g/dL (ref 32.0–36.0)
MCV: 80.5 fL (ref 80.0–100.0)
MPV: 10.6 fL (ref 7.5–12.5)
Platelets: 242 10*3/uL (ref 140–400)
RBC: 5.49 10*6/uL — AB (ref 3.80–5.10)
RDW: 13.9 % (ref 11.0–15.0)
WBC: 6.2 10*3/uL (ref 3.8–10.8)

## 2017-12-08 LAB — LIPID PANEL
Cholesterol: 236 mg/dL — ABNORMAL HIGH (ref <200)
HDL: 47 mg/dL — AB (ref >50)
LDL Cholesterol (Calc): 167 mg/dL (calc) — ABNORMAL HIGH
NON-HDL CHOLESTEROL (CALC): 189 mg/dL — AB (ref <130)
TRIGLYCERIDES: 103 mg/dL (ref <150)
Total CHOL/HDL Ratio: 5 (calc) — ABNORMAL HIGH (ref <5.0)

## 2017-12-08 LAB — TSH: TSH: 1.83 mIU/L (ref 0.40–4.50)

## 2017-12-08 MED ORDER — PRAVASTATIN SODIUM 40 MG PO TABS: 40.0000 mg | ORAL_TABLET | Freq: Every day | ORAL | 3 refills | Status: DC

## 2017-12-08 MED ORDER — ERGOCALCIFEROL 1.25 MG (50000 UT) PO CAPS: 50000.0000 [IU] | ORAL_CAPSULE | ORAL | 3 refills | Status: DC

## 2017-12-21 ENCOUNTER — Telehealth: Payer: Self-pay

## 2017-12-21 DIAGNOSIS — I10 Essential (primary) hypertension: Secondary | ICD-10-CM

## 2017-12-21 DIAGNOSIS — E785 Hyperlipidemia, unspecified: Secondary | ICD-10-CM

## 2017-12-21 DIAGNOSIS — R7303 Prediabetes: Secondary | ICD-10-CM

## 2017-12-21 NOTE — Telephone Encounter (Signed)
Labs ordered per Dr.Simpson  

## 2018-01-04 ENCOUNTER — Other Ambulatory Visit: Payer: Self-pay | Admitting: Family Medicine

## 2018-02-13 ENCOUNTER — Telehealth: Payer: Self-pay | Admitting: Family Medicine

## 2018-02-13 ENCOUNTER — Encounter: Payer: Self-pay | Admitting: Cardiovascular Disease

## 2018-02-13 ENCOUNTER — Other Ambulatory Visit: Payer: Self-pay

## 2018-02-13 MED ORDER — UNABLE TO FIND: 0 refills | Status: DC

## 2018-02-13 NOTE — Telephone Encounter (Signed)
rx sent to [facility]

## 2018-02-13 NOTE — Telephone Encounter (Signed)
2 tubings, mouth piece and mask---Nebulizor is making weird noise, may need a new one--please check . Please send to [facility]

## 2018-03-02 ENCOUNTER — Telehealth: Payer: Self-pay | Admitting: Family Medicine

## 2018-03-02 NOTE — Telephone Encounter (Signed)
PT is calling  --she needs tubing, mouth piece and nebulizer

## 2018-03-06 NOTE — Telephone Encounter (Signed)
Left patient a generic message requesting call back to discuss message she left. Looks like a prescription was just sent in on 9/3 for tubing and a mouth piece.

## 2018-03-07 NOTE — Telephone Encounter (Signed)
Tried to call patient back about tubing and mouth piece for machine. No answer. Left generic message requesting call back.

## 2018-03-09 NOTE — Telephone Encounter (Signed)
Called patient to discuss message regarding tubing and mouth piece. No answer left message requesting call back. 3rd attempt.

## 2018-03-11 ENCOUNTER — Other Ambulatory Visit: Payer: Self-pay | Admitting: Family Medicine

## 2018-03-11 DIAGNOSIS — I10 Essential (primary) hypertension: Secondary | ICD-10-CM

## 2018-03-13 ENCOUNTER — Ambulatory Visit: Payer: BC Managed Care – PPO | Admitting: Cardiovascular Disease

## 2018-03-13 DIAGNOSIS — R0989 Other specified symptoms and signs involving the circulatory and respiratory systems: Secondary | ICD-10-CM

## 2018-03-15 ENCOUNTER — Encounter: Payer: Self-pay | Admitting: Cardiovascular Disease

## 2018-04-03 ENCOUNTER — Encounter: Payer: BC Managed Care – PPO | Admitting: Family Medicine

## 2018-04-10 ENCOUNTER — Telehealth: Payer: Self-pay | Admitting: Family Medicine

## 2018-04-10 NOTE — Telephone Encounter (Signed)
Pt is calling needing her cough syrup for her asthma and cream for her rash. [facility] on [address] does not have it on file, can you please send this in

## 2018-04-16 NOTE — Telephone Encounter (Signed)
PLS CALL AND EXPLAIN SHE NEEDS TO KEEP APPT, AND M,AKE ONE SOON, WILL RX MEDS WHEN SHE IS SEEN . RECENTLY MISSED HER APPT

## 2018-04-19 ENCOUNTER — Ambulatory Visit: Payer: BC Managed Care – PPO | Admitting: Family Medicine

## 2018-06-12 ENCOUNTER — Encounter: Payer: Self-pay | Admitting: Family Medicine

## 2018-06-12 ENCOUNTER — Ambulatory Visit (INDEPENDENT_AMBULATORY_CARE_PROVIDER_SITE_OTHER): Payer: BC Managed Care – PPO | Admitting: Family Medicine

## 2018-06-12 VITALS — BP 130/70 | HR 66 | Resp 12 | Ht 64.0 in | Wt 311.1 lb

## 2018-06-12 DIAGNOSIS — B369 Superficial mycosis, unspecified: Secondary | ICD-10-CM

## 2018-06-12 DIAGNOSIS — J45991 Cough variant asthma: Secondary | ICD-10-CM

## 2018-06-12 DIAGNOSIS — E559 Vitamin D deficiency, unspecified: Secondary | ICD-10-CM

## 2018-06-12 DIAGNOSIS — Z6841 Body Mass Index (BMI) 40.0 and over, adult: Secondary | ICD-10-CM

## 2018-06-12 DIAGNOSIS — Z0000 Encounter for general adult medical examination without abnormal findings: Secondary | ICD-10-CM

## 2018-06-12 DIAGNOSIS — E785 Hyperlipidemia, unspecified: Secondary | ICD-10-CM

## 2018-06-12 DIAGNOSIS — Z1211 Encounter for screening for malignant neoplasm of colon: Secondary | ICD-10-CM

## 2018-06-12 DIAGNOSIS — Z23 Encounter for immunization: Secondary | ICD-10-CM

## 2018-06-12 DIAGNOSIS — Z1231 Encounter for screening mammogram for malignant neoplasm of breast: Secondary | ICD-10-CM

## 2018-06-12 DIAGNOSIS — I10 Essential (primary) hypertension: Secondary | ICD-10-CM

## 2018-06-12 DIAGNOSIS — R7303 Prediabetes: Secondary | ICD-10-CM

## 2018-06-12 MED ORDER — ERGOCALCIFEROL 1.25 MG (50000 UT) PO CAPS: 50000.0000 [IU] | ORAL_CAPSULE | ORAL | 1 refills | Status: DC

## 2018-06-12 MED ORDER — HYDROCOD POLST-CPM POLST ER 10-8 MG/5ML PO SUER
5.0000 mL | Freq: Two times a day (BID) | ORAL | 0 refills | Status: DC | PRN
Start: 2018-06-12 — End: 2018-10-11

## 2018-06-12 MED ORDER — CLOTRIMAZOLE-BETAMETHASONE 1-0.05 % EX CREA: TOPICAL_CREAM | CUTANEOUS | 1 refills | Status: DC

## 2018-06-12 MED ORDER — ALBUTEROL SULFATE 1.25 MG/3ML IN NEBU: 1.0000 | INHALATION_SOLUTION | Freq: Four times a day (QID) | RESPIRATORY_TRACT | 5 refills | Status: DC | PRN

## 2018-06-12 NOTE — Patient Instructions (Addendum)
F/U in 4 months, call if you need me before  Please submit stool for cologuard test we will organize that  For you  Fl;u vaccine today high dose  Fasting lipid, cmp and EGFR, hBA1C  It is important that you exercise regularly at least 30 minutes 5 times a week. If you develop chest pain, have severe difficulty breathing, or feel very tired, stop exercising immediately and seek medical attention   1500 cal diet sheet provided  Mammogram will be ordered and scheduled latest appt , work ensds at 3;35  Thank you  for choosing [facility]. We consider it a privelige to serve you.  Delivering excellent health care in a caring and  compassionate way is our goal.  Partnering with you,  so that together we can achieve this goal is our strategy.

## 2018-06-12 NOTE — Progress Notes (Signed)
    Brandy Dean     MRN: [id number]      DOB: 12/31/1950  HPI: Patient is in for annual physical exam. Visit for face to face encounter for neb machine today. Reports stablity as far as her breathing is concerned, she uses daily preventive medication, Symbicort with good results, however does on average once to twice per month need rescue treatment involving in home nebulization. She has not needed to go to the ED for management of flares since Jan 2017. The colder months inter, and Spring tend to be the most challenging for her . Immunization is reviewed , and  updated    PE: BP 130/70 (BP Location: Right Arm, Patient Position: Sitting, Cuff Size: Large)   Pulse 66   Resp 12   Ht 5\' 4"  (1.626 m)   Wt (!) 311 lb 1.9 oz (141.1 kg)   SpO2 95% Comment: room air  BMI 53.40 kg/m  Pleasant  female, alert and oriented x 3, in no cardio-pulmonary distress. Afebrile. HEENT No facial trauma or asymetry. Sinuses non tender.  Extra occullar muscles intact, pupils equally reactive to light. External ears normal, tympanic membranes clear. Oropharynx moist, no exudate. Neck: supple, no adenopathy,JVD or thyromegaly.No bruits.  Chest: Clear to ascultation bilaterally.No crackles or wheezes. Non tender to palpation  Breast: No asymetry,no masses or lumps. No tenderness. No nipple discharge or inversion. No axillary or supraclavicular adenopathy  Cardiovascular system; Heart sounds normal,  S1 and  S2 ,no S3.  systolic murmur, or thrill. Apical beat not displaced Peripheral pulses normal.  Abdomen: Soft, non tender, no organomegaly or masses. No bruits. Bowel sounds normal. No guarding, tenderness or rebound.     Musculoskeletal exam: Decreased though adequate  ROM of spine, hips , shoulders and knees.  deformity ,swelling and  crepitus noted. No muscle wasting or atrophy.   Neurologic: Cranial nerves 2 to 12 intact. Power, tone ,sensation and reflexes normal  throughout. Noted disturbance in gait. No tremor.  Skin: Intact, no ulceration, erythema , scaling or rash noted. Pigmentation normal throughout  Psych; Normal mood and affect. Judgement and concentration normal   Assessment & Plan:  Annual physical exam Annual exam as documented. . Immunization and cancer screening needs are specifically addressed at this visit.   Need for immunization against influenza After obtaining informed consent, the vaccine is  administered with no noted adverse side effects at time of administration   Morbid obesity with BMI of 50.0-59.9, adult Slight improvement, she is encouraged to continue and to build on this change in food choice Patient re-educated about  the importance of commitment to a  minimum of 150 minutes of exercise per week.  The importance of healthy food choices with portion control discussed. Encouraged to start a food diary, count calories and to consider  joining a support group. Sample diet sheets offered. Goals set by the patient for the next several months.   Weight /BMI 06/12/2018 10/31/2017 09/28/2017  WEIGHT 311 lb 1.9 oz 315 lb 318 lb  HEIGHT 5\' 4"  5\' 4"  5\' 4"   BMI 53.4 kg/m2 54.07 kg/m2 54.58 kg/m2      Asthma, cough variant Controlled with daily preventive medication adequately. Needs to have rescue medication available for in home use in the event of an acute exacerbation, hence need for Neb machine and supplies ongoing is justified and needed. This visit meets the requirement of face to face evaluation for supplies

## 2018-06-17 ENCOUNTER — Encounter: Payer: Self-pay | Admitting: Family Medicine

## 2018-06-17 DIAGNOSIS — Z23 Encounter for immunization: Secondary | ICD-10-CM | POA: Insufficient documentation

## 2018-06-17 NOTE — Assessment & Plan Note (Signed)
Annual exam as documented. . Immunization and cancer screening needs are specifically addressed at this visit.  

## 2018-06-17 NOTE — Assessment & Plan Note (Signed)
After obtaining informed consent, the vaccine is  administered with no noted adverse side effects at time of administration

## 2018-06-17 NOTE — Assessment & Plan Note (Signed)
Slight improvement, she is encouraged to continue and to build on this change in food choice Patient re-educated about  the importance of commitment to a  minimum of 150 minutes of exercise per week.  The importance of healthy food choices with portion control discussed. Encouraged to start a food diary, count calories and to consider  joining a support group. Sample diet sheets offered. Goals set by the patient for the next several months.   Weight /BMI 06/12/2018 10/31/2017 09/28/2017  WEIGHT 311 lb 1.9 oz 315 lb 318 lb  HEIGHT 5\' 4"  5\' 4"  5\' 4"   BMI 53.4 kg/m2 54.07 kg/m2 54.58 kg/m2

## 2018-06-17 NOTE — Assessment & Plan Note (Signed)
Controlled with daily preventive medication adequately. Needs to have rescue medication available for in home use in the event of an acute exacerbation, hence need for Neb machine and supplies ongoing is justified and needed. This visit meets the requirement of face to face evaluation for supplies

## 2018-06-25 ENCOUNTER — Ambulatory Visit (HOSPITAL_COMMUNITY)
Admission: RE | Admit: 2018-06-25 | Discharge: 2018-06-25 | Disposition: A | Discharge status: Home / Self Care | Payer: BC Managed Care – PPO | Source: Ambulatory Visit | Attending: Family Medicine | Admitting: Family Medicine

## 2018-06-25 DIAGNOSIS — Z1231 Encounter for screening mammogram for malignant neoplasm of breast: Secondary | ICD-10-CM

## 2018-07-02 LAB — COLOGUARD: Cologuard: NEGATIVE

## 2018-07-03 ENCOUNTER — Telehealth: Payer: Self-pay | Admitting: Family Medicine

## 2018-07-03 NOTE — Telephone Encounter (Signed)
Pt is calling she states she has been sick since she came in and was out two days last week, two days this week, and she needs a note, I offered her appointments to be seen, and she stated she could only come after 3:45.

## 2018-07-03 NOTE — Telephone Encounter (Signed)
Spoke with patient. Scheduled for tomorrow 07/04/2018 at 3:20.

## 2018-07-04 ENCOUNTER — Encounter: Payer: Self-pay | Admitting: Family Medicine

## 2018-07-04 ENCOUNTER — Ambulatory Visit (INDEPENDENT_AMBULATORY_CARE_PROVIDER_SITE_OTHER): Payer: BC Managed Care – PPO | Admitting: Family Medicine

## 2018-07-04 VITALS — BP 118/82 | HR 68 | Temp 98.7°F | Resp 15 | Ht 64.0 in | Wt 304.0 lb

## 2018-07-04 DIAGNOSIS — J209 Acute bronchitis, unspecified: Secondary | ICD-10-CM | POA: Diagnosis not present

## 2018-07-04 DIAGNOSIS — Z6841 Body Mass Index (BMI) 40.0 and over, adult: Secondary | ICD-10-CM

## 2018-07-04 DIAGNOSIS — J45991 Cough variant asthma: Secondary | ICD-10-CM | POA: Diagnosis not present

## 2018-07-04 DIAGNOSIS — I10 Essential (primary) hypertension: Secondary | ICD-10-CM

## 2018-07-04 DIAGNOSIS — R7303 Prediabetes: Secondary | ICD-10-CM

## 2018-07-04 MED ORDER — MONTELUKAST SODIUM 10 MG PO TABS: 10.0000 mg | ORAL_TABLET | Freq: Every day | ORAL | 3 refills | Status: DC

## 2018-07-04 MED ORDER — PREDNISONE 5 MG (21) PO TBPK: 5.0000 mg | ORAL_TABLET | ORAL | 0 refills | Status: DC

## 2018-07-04 MED ORDER — AZITHROMYCIN 250 MG PO TABS: ORAL_TABLET | ORAL | 0 refills | Status: DC

## 2018-07-04 NOTE — Patient Instructions (Signed)
F/U as before, call if you need me before  Work excuse for  07/03/2018 to return Jul 04, 2018, will be provided today  Three medications are prescribed, prednisone and  Azithromycin are only for  6 days  New  Every day for allergies and wheezing is montelukast  Thank you  for choosing [facility]. We consider it a privelige to serve you.  Delivering excellent health care in a caring and  compassionate way is our goal.  Partnering with you,  so that together we can achieve this goal is our strategy.

## 2018-07-04 NOTE — Assessment & Plan Note (Addendum)
increased and uncontrolled allergy symptoms started Last week Monday, improved, then got worse over the weekend, out of work this past Monday and Tuesday, work excuse and medication prescribed

## 2018-07-08 DIAGNOSIS — J209 Acute bronchitis, unspecified: Secondary | ICD-10-CM | POA: Insufficient documentation

## 2018-07-08 NOTE — Assessment & Plan Note (Signed)
Controlled, no change in medication DASH diet and commitment to daily physical activity for a minimum of 30 minutes discussed and encouraged, as a part of hypertension management. The importance of attaining a healthy weight is also discussed.  BP/Weight 07/04/2018 06/12/2018 10/31/2017 09/28/2017 06/26/2017 04/17/2017 06/01/2016  Systolic BP 118 130 128 138 130 130 134  Diastolic BP 82 70 84 84 80 80 82  Wt. (Lbs) 304 311.12 315 318 315.5 320 317  BMI 52.18 53.4 54.07 54.58 55.89 56.69 56.15

## 2018-07-08 NOTE — Assessment & Plan Note (Signed)
Improving, she is applauded on this and encouraged to continue same Patient re-educated about  the importance of commitment to a  minimum of 150 minutes of exercise per week.  The importance of healthy food choices with portion control discussed. Encouraged to start a food diary, count calories and to consider  joining a support group. Sample diet sheets offered. Goals set by the patient for the next several months.   Weight /BMI 07/04/2018 06/12/2018 10/31/2017  WEIGHT 304 lb 311 lb 1.9 oz 315 lb  HEIGHT 5\' 4"  5\' 4"  5\' 4"   BMI 52.18 kg/m2 53.4 kg/m2 54.07 kg/m2

## 2018-07-08 NOTE — Assessment & Plan Note (Signed)
Patient educated about the importance of limiting  Carbohydrate intake , the need to commit to daily physical activity for a minimum of 30 minutes , and to commit weight loss. The fact that changes in all these areas will reduce or eliminate all together the development of diabetes is stressed.  Updated lab needed at/ before next visit.   Diabetic Labs Latest Ref Rng & Units 12/07/2017 04/20/2017 05/30/2016 07/16/2015 06/23/2015  HbA1c <5.7 % of total Hgb 5.7(H) 5.7(H) 5.7(H) 6.2(H) -  Chol <200 mg/dL 283(T) 517(O) 160(V) - -  HDL >50 mg/dL 37(T) 76 69 - -  Calc LDL mg/dL (calc) 062(I) 948(N) 462(V) - -  Triglycerides <150 mg/dL 035 76 62 - -  Creatinine 0.50 - 0.99 mg/dL 0.09 3.81 8.29 9.37 1.69   BP/Weight 07/04/2018 06/12/2018 10/31/2017 09/28/2017 06/26/2017 04/17/2017 06/01/2016  Systolic BP 118 130 128 138 130 130 134  Diastolic BP 82 70 84 84 80 80 82  Wt. (Lbs) 304 311.12 315 318 315.5 320 317  BMI 52.18 53.4 54.07 54.58 55.89 56.69 56.15   No flowsheet data found.

## 2018-07-08 NOTE — Progress Notes (Signed)
Brandy Dean     MRN: [id number]      DOB: [birth year]   HPI Brandy Dean is here with a 5 day h/o excessive cough with sputum , which is at times thick and cream in color. Also has ahd chills and possible fever Increased nasal congestion and [post nasal drainage with tickle in throat noted also, started improving but seems to have gone backwards agai, missed work x 1 day and needs note to cover  ROS . Denies chest pains, palpitations and leg swelling Denies abdominal pain, nausea, vomiting,diarrhea or constipation.   Denies dysuria, frequency, hesitancy or incontinence. Denies joint pain, swelling and limitation in mobility. Denies headaches, seizures, numbness, or tingling. Denies depression, anxiety or insomnia. Denies skin break down or rash.   PE  BP 118/82   Pulse 68   Temp 98.7 F (37.1 C) (Oral)   Resp 15   Ht 5\' 4"  (1.626 m)   Wt (!) 304 lb (137.9 kg)   SpO2 94%   BMI 52.18 kg/m   Patient alert and oriented and in no cardiopulmonary distress.  HEENT: No facial asymmetry, EOMI,   oropharynx pink and moist.  Neck supple no JVD, no mass.Nasal mucosa edematous and red Chest: decreased air entry, few wheezes and basilar crakles  CVS: S1, S2 no murmurs, no S3.Regular rate.  ABD: Soft non tender.   Ext: No edema  MS: Adequate though reduced ROM spine, shoulders, hips and knees.  Skin: Intact, no ulcerations or rash noted.  Psych: Good eye contact, normal affect. Memory intact not anxious or depressed appearing.  CNS: CN 2-12 intact, power,  normal throughout.no focal deficits noted.   Assessment & Plan  Asthma, cough variant increased and uncontrolled allergy symptoms started Last week Monday, improved, then got worse over the weekend, out of work this past Monday and Tuesday, work excuse and medication prescribed  Acute bronchitis Decongestant a Z pack prescribed  Essential hypertension Controlled, no change in medication DASH diet and commitment to  daily physical activity for a minimum of 30 minutes discussed and encouraged, as a part of hypertension management. The importance of attaining a healthy weight is also discussed.  BP/Weight 07/04/2018 06/12/2018 10/31/2017 09/28/2017 06/26/2017 04/17/2017 06/01/2016  Systolic BP 118 130 128 138 130 130 134  Diastolic BP 82 70 84 84 80 80 82  Wt. (Lbs) 304 311.12 315 318 315.5 320 317  BMI 52.18 53.4 54.07 54.58 55.89 56.69 56.15       Morbid obesity with BMI of 50.0-59.9, adult Improving, she is applauded on this and encouraged to continue same Patient re-educated about  the importance of commitment to a  minimum of 150 minutes of exercise per week.  The importance of healthy food choices with portion control discussed. Encouraged to start a food diary, count calories and to consider  joining a support group. Sample diet sheets offered. Goals set by the patient for the next several months.   Weight /BMI 07/04/2018 06/12/2018 10/31/2017  WEIGHT 304 lb 311 lb 1.9 oz 315 lb  HEIGHT 5\' 4"  5\' 4"  5\' 4"   BMI 52.18 kg/m2 53.4 kg/m2 54.07 kg/m2      Prediabetes Patient educated about the importance of limiting  Carbohydrate intake , the need to commit to daily physical activity for a minimum of 30 minutes , and to commit weight loss. The fact that changes in all these areas will reduce or eliminate all together the development of diabetes is stressed.  Updated lab needed at/  before next visit.   Diabetic Labs Latest Ref Rng & Units 12/07/2017 04/20/2017 05/30/2016 07/16/2015 06/23/2015  HbA1c <5.7 % of total Hgb 5.7(H) 5.7(H) 5.7(H) 6.2(H) -  Chol <200 mg/dL 161(W) 960(A) 540(J) - -  HDL >50 mg/dL 81(X) 76 69 - -  Calc LDL mg/dL (calc) 914(N) 829(F) 621(H) - -  Triglycerides <150 mg/dL 086 76 62 - -  Creatinine 0.50 - 0.99 mg/dL 5.78 4.69 6.29 5.28 4.13   BP/Weight 07/04/2018 06/12/2018 10/31/2017 09/28/2017 06/26/2017 04/17/2017 06/01/2016  Systolic BP 118 130 128 138 130 130 134  Diastolic BP  82 70 84 84 80 80 82  Wt. (Lbs) 304 311.12 315 318 315.5 320 317  BMI 52.18 53.4 54.07 54.58 55.89 56.69 56.15   No flowsheet data found.

## 2018-07-08 NOTE — Assessment & Plan Note (Signed)
Decongestant a Z pack prescribed

## 2018-08-01 ENCOUNTER — Other Ambulatory Visit: Payer: Self-pay

## 2018-08-01 MED ORDER — BUDESONIDE-FORMOTEROL FUMARATE 160-4.5 MCG/ACT IN AERO: 2.0000 | INHALATION_SPRAY | Freq: Two times a day (BID) | RESPIRATORY_TRACT | 3 refills | Status: DC

## 2018-08-03 ENCOUNTER — Other Ambulatory Visit: Payer: Self-pay | Admitting: Family Medicine

## 2018-08-03 MED ORDER — SYMBICORT 160-4.5 MCG/ACT IN AERO: 2.0000 | INHALATION_SPRAY | Freq: Two times a day (BID) | RESPIRATORY_TRACT | 12 refills | Status: DC

## 2018-08-06 ENCOUNTER — Telehealth: Payer: Self-pay

## 2018-08-06 NOTE — Telephone Encounter (Signed)
Called patient to verify if she had gotten inhaler. No answer. Will try again later

## 2018-08-07 NOTE — Telephone Encounter (Signed)
Spoke with patient and she picked up her inhaler last week.

## 2018-09-13 ENCOUNTER — Other Ambulatory Visit: Payer: Self-pay | Admitting: Family Medicine

## 2018-09-13 ENCOUNTER — Other Ambulatory Visit: Payer: Self-pay

## 2018-09-13 ENCOUNTER — Telehealth: Payer: Self-pay | Admitting: *Deleted

## 2018-09-13 DIAGNOSIS — I10 Essential (primary) hypertension: Secondary | ICD-10-CM

## 2018-09-13 MED ORDER — TRIAMTERENE-HCTZ 75-50 MG PO TABS: 1.0000 | ORAL_TABLET | Freq: Every day | ORAL | 1 refills | Status: DC

## 2018-09-13 NOTE — Telephone Encounter (Signed)
Pt called stating her blood pressure medicine (she did not know the name of it) was sent in wrong as she usually gets 90 day supply and they only gave her a month. She picked up the month supply but wanted to know if this could be changed back to 90 day supply

## 2018-09-13 NOTE — Telephone Encounter (Signed)
done

## 2018-10-02 ENCOUNTER — Other Ambulatory Visit: Payer: Self-pay | Admitting: Family Medicine

## 2018-10-02 DIAGNOSIS — B369 Superficial mycosis, unspecified: Secondary | ICD-10-CM

## 2018-10-10 ENCOUNTER — Telehealth: Payer: Self-pay | Admitting: *Deleted

## 2018-10-10 NOTE — Telephone Encounter (Signed)
Pt called needing a refill on her tussinex.This can be sent to [facility] pharmacy in [place] [address]

## 2018-10-11 ENCOUNTER — Other Ambulatory Visit: Payer: Self-pay | Admitting: Family Medicine

## 2018-10-11 MED ORDER — HYDROCOD POLST-CPM POLST ER 10-8 MG/5ML PO SUER: ORAL | 0 refills | Status: DC

## 2018-10-13 NOTE — Telephone Encounter (Signed)
Spoke with pt and medication has been refilled

## 2018-10-17 ENCOUNTER — Encounter: Payer: Self-pay | Admitting: Family Medicine

## 2018-10-17 ENCOUNTER — Other Ambulatory Visit: Payer: Self-pay

## 2018-10-17 ENCOUNTER — Ambulatory Visit (INDEPENDENT_AMBULATORY_CARE_PROVIDER_SITE_OTHER): Payer: BC Managed Care – PPO | Admitting: Family Medicine

## 2018-10-17 VITALS — BP 118/82 | Ht 64.0 in | Wt 311.0 lb

## 2018-10-17 DIAGNOSIS — J45991 Cough variant asthma: Secondary | ICD-10-CM

## 2018-10-17 DIAGNOSIS — I10 Essential (primary) hypertension: Secondary | ICD-10-CM

## 2018-10-17 DIAGNOSIS — Z6841 Body Mass Index (BMI) 40.0 and over, adult: Secondary | ICD-10-CM

## 2018-10-17 DIAGNOSIS — E785 Hyperlipidemia, unspecified: Secondary | ICD-10-CM

## 2018-10-17 MED ORDER — MONTELUKAST SODIUM 10 MG PO TABS: 10.0000 mg | ORAL_TABLET | Freq: Every day | ORAL | 3 refills | Status: DC

## 2018-10-17 MED ORDER — ATORVASTATIN CALCIUM 20 MG PO TABS: 20.0000 mg | ORAL_TABLET | Freq: Every day | ORAL | 5 refills | Status: DC

## 2018-10-17 NOTE — Progress Notes (Signed)
Virtual Visit via Telephone Note   This visit type was conducted due to national recommendations for restrictions regarding the COVID-19 Pandemic (e.g. social distancing) in an effort to limit this patient's exposure and mitigate transmission in our community.  Due to her co-morbid illnesses, this patient is at least at moderate risk for complications without adequate follow up.  This format is felt to be most appropriate for this patient at this time.  The patient did not have access to video technology/had technical difficulties with video requiring transitioning to audio format only (telephone).  All issues noted in this document were discussed and addressed.  No physical exam could be performed with this format.  Please refer to the patient's chart for her  consent to telehealth [facility].   Evaluation Performed:  Follow-up visit  Date:  10/17/2018   ID:  Brandy Dean, DOB 01/29/1951, MRN [id number]  Patient Location: Home Provider Location: Other:  Telemedicine  Location of Patient: Home Location of Provider: Telehealth Consent was obtain for visit to be over via telehealth. I verified that I am speaking with the correct person using two identifiers.  PCP:  Kerri PerchesSimpson, Margaret E, MD   Chief Complaint: Follow-up on chronic conditions  History of Present Illness:    Brandy Dean is a 68 y.o. female patient of Dr. Anthony SarSimpson's.  Has history of hypertension, cough variant asthma, morbid obesity, hyperlipidemia, prediabetic.  Reports taking her medications as directed and without issue or trouble.  Hypertension: Is currently maintained on Maxide.  Denies having any headaches, vision changes, chest pain, chest tightness, leg swelling, palpitations.  Does not feel like her blood pressure is elevated.  Does not have cough to take it at home.  Previous visits this was controlled.  Asthma: Doing well. Denies any cough, shortness of breath, wheezing, difficulties with breathing.   Needs refill of Singulair.  Reports not needing to use her rescue inhaler.  Using her Symbicort as directed.  Obesity: Trying to eat good. Trying to not over eat. Eating good foods, including fruits and veggies.  Denies having any weight loss.   Overall reports that she is doing good and is without complaints today during the telephone visit.  Reports that she forgot to get her labs prior to this visit.  But will be getting them this week.  The patient does not have symptoms concerning for COVID-19 infection (fever, chills, cough, or new shortness of breath).   Past Medical, Surgical, Social History, Allergies, and Medications have been Reviewed.   Past Medical History:  Diagnosis Date  . Bacterial pneumonia    March  9-12,  2011  . Bronchial asthma   . Essential hypertension, benign   . Morbid obesity (HCC)   . Sleep apnea    Past Surgical History:  Procedure Laterality Date  . CESAREAN SECTION  1988 & 1991  . Partial left lobectomy  1968  . RIght and left keloid ears  1968     Current Meds  Medication Sig  . albuterol (ACCUNEB) 1.25 MG/3ML nebulizer solution Take 3 mLs (1.25 mg total) by nebulization every 6 (six) hours as needed for wheezing.  Marland Kitchen. albuterol (PROVENTIL HFA;VENTOLIN HFA) 108 (90 Base) MCG/ACT inhaler Inhale 2 puffs every 6 (six) hours as needed into the lungs for wheezing or shortness of breath.  Marland Kitchen. atorvastatin (LIPITOR) 20 MG tablet Take 1 tablet (20 mg total) by mouth daily.  . chlorpheniramine-HYDROcodone (TUSSIONEX PENNKINETIC ER) 10-8 MG/5ML SUER Take one teaspoon  two times daily , by mouth, as needed, for excess cough  . clotrimazole-betamethasone (LOTRISONE) cream APPLY TO AFFECTED AREA TWICE A DAY  . ergocalciferol (VITAMIN D2) 1.25 MG (50000 UT) capsule Take 1 capsule (50,000 Units total) by mouth once a week. One capsule once weekly  . montelukast (SINGULAIR) 10 MG tablet Take 1 tablet (10 mg total) by mouth at bedtime.  . SYMBICORT 160-4.5 MCG/ACT  inhaler Inhale 2 puffs into the lungs 2 (two) times daily.  Marland Kitchen triamterene-hydrochlorothiazide (MAXZIDE) 75-50 MG tablet TAKE 1 TABLET BY MOUTH EVERY DAY  . UNABLE TO FIND Nebulizer machine (with 2 tubings and mouthpiece) Dx asthma J45.991  . [DISCONTINUED] atorvastatin (LIPITOR) 20 MG tablet Take 1 tablet (20 mg total) by mouth daily.  . [DISCONTINUED] montelukast (SINGULAIR) 10 MG tablet Take 1 tablet (10 mg total) by mouth at bedtime.     Allergies:   Benzonatate   Social History   Tobacco Use  . Smoking status: Former Smoker    Types: Cigarettes    Last attempt to quit: 06/14/2007    Years since quitting: 11.3  . Smokeless tobacco: Never Used  Substance Use Topics  . Alcohol use: No  . Drug use: No     Family Hx: The patient's family history includes Esophageal cancer in her father; Hypertension in her mother; Obesity in her sister; Stroke in her mother.  ROS:   Please see the history of present illness.    All other systems reviewed and are negative.   Labs/Other Tests and Data Reviewed:    Recent Labs: 12/07/2017: ALT 15; BUN 18; Creat 0.95; Hemoglobin 14.2; Platelets 242; Potassium 3.7; Sodium 137; TSH 1.83   Recent Lipid Panel Lab Results  Component Value Date/Time   CHOL 236 (H) 12/07/2017 08:53 AM   TRIG 103 12/07/2017 08:53 AM   HDL 47 (L) 12/07/2017 08:53 AM   CHOLHDL 5.0 (H) 12/07/2017 08:53 AM   LDLCALC 167 (H) 12/07/2017 08:53 AM    Wt Readings from Last 3 Encounters:  10/17/18 (!) 311 lb (141.1 kg)  07/04/18 (!) 304 lb (137.9 kg)  06/12/18 (!) 311 lb 1.9 oz (141.1 kg)     Objective:    Vital Signs:  BP 118/82   Ht 5\' 4"  (1.626 m)   Wt (!) 311 lb (141.1 kg)   BMI 53.38 kg/m    VITAL SIGNS:  reviewed GEN:  Alert and oriented RESPIRATORY:  No signs of shortness of breath during conversation PSYCH:  Normal affect, mood, memory intact, good communication during conversation  ASSESSMENT & PLAN:    1. Morbid obesity with BMI of 50.0-59.9, adult  (HCC) Not controlled, provided with education on calorie counting and eating a well balanced diet.  Encouraged to start physical activity, 30 minutes on most days of the week.  As part of maintenance for weight loss, and high blood pressure.  2. Essential hypertension Controlled, continue current medication    3. Asthma, cough variant Controlled, continue current medication  Refill provided   - montelukast (SINGULAIR) 10 MG tablet; Take 1 tablet (10 mg total) by mouth at bedtime.  Dispense: 30 tablet; Refill: 3  4. Hyperlipidemia with target LDL less than 100 Not previously controlled. Continue medication will reassess with labs. And adjust medication as needed.   - atorvastatin (LIPITOR) 20 MG tablet; Take 1 tablet (20 mg total) by mouth daily.  Dispense: 30 tablet; Refill: 5  Time:   Today, I have spent 10 minutes with the patient with telehealth technology discussing  the above problems.     Medication Adjustments/Labs and Tests Ordered: Current medicines are reviewed at length with the patient today.  Concerns regarding medicines are outlined above.   Tests Ordered: No orders of the defined types were placed in this encounter.   Medication Changes: Meds ordered this encounter  Medications  . atorvastatin (LIPITOR) 20 MG tablet    Sig: Take 1 tablet (20 mg total) by mouth daily.    Dispense:  30 tablet    Refill:  5  . montelukast (SINGULAIR) 10 MG tablet    Sig: Take 1 tablet (10 mg total) by mouth at bedtime.    Dispense:  30 tablet    Refill:  3    Disposition:  Follow up in 4 month(s)  Signed, Freddy Finner, NP  10/17/2018 3:40 PM     [facility]

## 2018-10-17 NOTE — Patient Instructions (Addendum)
Thank you for completing your visit via telemedicine-telephone. I appreciate the opportunity to provide you with the care for your health and wellness. Today we discussed: Overall health and wellness  Glad to hear that you are doing well and without issue.  Please continue to stay safe during this time.  By practicing social distancing.  Continue to take your medications as ordered.  Please get your labs as soon as possible if any changes in your medication are needed we will contact you based off of these findings.  Very glad that your Cologuard came back negative.  We will keep monitoring this in the coming years.  Please continue to work on a heart healthy, low-fat diet.  I have attached some information to help with calorie counting for weight loss.  Please work to try to walk 30 minutes at least 5 days a week.  For total of 150 minutes weekly.  This are both good for both heart health and weight loss.  Follow up in Sept. Sooner if you need us  [facility]WASH YOUR HANDS WELL AND FREQUENTLY. AVOID TOUCHING YOUR FACE, UNLESS YOUR HANDS ARE FRESHLY WASHED.  GET FRESH AIR DAILY. STAY HYDRATED WITH WATER.   It was a pleasure to see you and I look forward to continuing to work together on your health and well-being. Please do not hesitate to call the office if you need care or have questions about your care.  Have a wonderful day and week. With Gratitude, Tereasa CoopHannah Berel Najjar, DNP, AGNP-BC   Calorie Counting for Weight Loss Calories are units of energy. Your body needs a certain amount of calories from food to keep you going throughout the day. When you eat more calories than your body needs, your body stores the extra calories as fat. When you eat fewer calories than your body needs, your body burns fat to get the energy it needs. Calorie counting means keeping track of how many calories you eat and drink each day. Calorie counting can be helpful if you need to lose weight. If you make sure to eat  fewer calories than your body needs, you should lose weight. Ask your health care provider what a healthy weight is for you. For calorie counting to work, you will need to eat the right number of calories in a day in order to lose a healthy amount of weight per week. A dietitian can help you determine how many calories you need in a day and will give you suggestions on how to reach your calorie goal.  A healthy amount of weight to lose per week is usually 1-2 lb (0.5-0.9 kg). This usually means that your daily calorie intake should be reduced by 500-750 calories.  Eating 1,200 - 1,500 calories per day can help most women lose weight.  Eating 1,500 - 1,800 calories per day can help most men lose weight. What do I need to know about calorie counting? In order to meet your daily calorie goal, you will need to:  Find out how many calories are in each food you would like to eat. Try to do this before you eat.  Decide how much of the food you plan to eat.  Write down what you ate and how many calories it had. Doing this is called keeping a food log. To successfully lose weight, it is important to balance calorie counting with a healthy lifestyle that includes regular activity. Aim for 150 minutes of moderate exercise (such as walking) or 75 minutes of  vigorous exercise (such as running) each week. Where do I find calorie information?  The number of calories in a food can be found on a Nutrition Facts label. If a food does not have a Nutrition Facts label, try to look up the calories online or ask your dietitian for help. Remember that calories are listed per serving. If you choose to have more than one serving of a food, you will have to multiply the calories per serving by the amount of servings you plan to eat. For example, the label on a package of bread might say that a serving size is 1 slice and that there are 90 calories in a serving. If you eat 1 slice, you will have eaten 90 calories. If you  eat 2 slices, you will have eaten 180 calories. How do I keep a food log? Immediately after each meal, record the following information in your food log:  What you ate. Don't forget to include toppings, sauces, and other extras on the food.  How much you ate. This can be measured in cups, ounces, or number of items.  How many calories each food and drink had.  The total number of calories in the meal. Keep your food log near you, such as in a small notebook in your pocket, or use a mobile app or website. Some programs will calculate calories for you and show you how many calories you have left for the day to meet your goal. What are some calorie counting tips?   Use your calories on foods and drinks that will fill you up and not leave you hungry: ? Some examples of foods that fill you up are nuts and nut butters, vegetables, lean proteins, and high-fiber foods like whole grains. High-fiber foods are foods with more than 5 g fiber per serving. ? Drinks such as sodas, specialty coffee drinks, alcohol, and juices have a lot of calories, yet do not fill you up.  Eat nutritious foods and avoid empty calories. Empty calories are calories you get from foods or beverages that do not have many vitamins or protein, such as candy, sweets, and soda. It is better to have a nutritious high-calorie food (such as an avocado) than a food with few nutrients (such as a bag of chips).  Know how many calories are in the foods you eat most often. This will help you calculate calorie counts faster.  Pay attention to calories in drinks. Low-calorie drinks include water and unsweetened drinks.  Pay attention to nutrition labels for "low fat" or "fat free" foods. These foods sometimes have the same amount of calories or more calories than the full fat versions. They also often have added sugar, starch, or salt, to make up for flavor that was removed with the fat.  Find a way of tracking calories that works for you.  Get creative. Try different apps or programs if writing down calories does not work for you. What are some portion control tips?  Know how many calories are in a serving. This will help you know how many servings of a certain food you can have.  Use a measuring cup to measure serving sizes. You could also try weighing out portions on a kitchen scale. With time, you will be able to estimate serving sizes for some foods.  Take some time to put servings of different foods on your favorite plates, bowls, and cups so you know what a serving looks like.  Try not to eat straight from  a bag or box. Doing this can lead to overeating. Put the amount you would like to eat in a cup or on a plate to make sure you are eating the right portion.  Use smaller plates, glasses, and bowls to prevent overeating.  Try not to multitask (for example, watch TV or use your computer) while eating. If it is time to eat, sit down at a table and enjoy your food. This will help you to know when you are full. It will also help you to be aware of what you are eating and how much you are eating. What are tips for following this plan? Reading food labels  Check the calorie count compared to the serving size. The serving size may be smaller than what you are used to eating.  Check the source of the calories. Make sure the food you are eating is high in vitamins and protein and low in saturated and trans fats. Shopping  Read nutrition labels while you shop. This will help you make healthy decisions before you decide to purchase your food.  Make a grocery list and stick to it. Cooking  Try to cook your favorite foods in a healthier way. For example, try baking instead of frying.  Use low-fat dairy products. Meal planning  Use more fruits and vegetables. Half of your plate should be fruits and vegetables.  Include lean proteins like poultry and fish. How do I count calories when eating out?  Ask for smaller portion  sizes.  Consider sharing an entree and sides instead of getting your own entree.  If you get your own entree, eat only half. Ask for a box at the beginning of your meal and put the rest of your entree in it so you are not tempted to eat it.  If calories are listed on the menu, choose the lower calorie options.  Choose dishes that include vegetables, fruits, whole grains, low-fat dairy products, and lean protein.  Choose items that are boiled, broiled, grilled, or steamed. Stay away from items that are buttered, battered, fried, or served with cream sauce. Items labeled "crispy" are usually fried, unless stated otherwise.  Choose water, low-fat milk, unsweetened iced tea, or other drinks without added sugar. If you want an alcoholic beverage, choose a lower calorie option such as a glass of wine or light beer.  Ask for dressings, sauces, and syrups on the side. These are usually high in calories, so you should limit the amount you eat.  If you want a salad, choose a garden salad and ask for grilled meats. Avoid extra toppings like bacon, cheese, or fried items. Ask for the dressing on the side, or ask for olive oil and vinegar or lemon to use as dressing.  Estimate how many servings of a food you are given. For example, a serving of cooked rice is  cup or about the size of half a baseball. Knowing serving sizes will help you be aware of how much food you are eating at restaurants. The list below tells you how big or small some common portion sizes are based on everyday objects: ? 1 oz-4 stacked dice. ? 3 oz-1 deck of cards. ? 1 tsp-1 die. ? 1 Tbsp- a ping-pong ball. ? 2 Tbsp-1 ping-pong ball. ?  cup- baseball. ? 1 cup-1 baseball. Summary  Calorie counting means keeping track of how many calories you eat and drink each day. If you eat fewer calories than your body needs, you should lose weight.  A healthy amount of weight to lose per week is usually 1-2 lb (0.5-0.9 kg). This usually  means reducing your daily calorie intake by 500-750 calories.  The number of calories in a food can be found on a Nutrition Facts label. If a food does not have a Nutrition Facts label, try to look up the calories online or ask your dietitian for help.  Use your calories on foods and drinks that will fill you up, and not on foods and drinks that will leave you hungry.  Use smaller plates, glasses, and bowls to prevent overeating. This information is not intended to replace advice given to you by your health care provider. Make sure you discuss any questions you have with your health care provider. Document Released: 05/30/2005 Document Revised: 02/16/2018 Document Reviewed: 04/29/2016 Elsevier Interactive Patient Education  2019 [employer].

## 2018-12-30 ENCOUNTER — Other Ambulatory Visit: Payer: Self-pay | Admitting: Family Medicine

## 2018-12-30 DIAGNOSIS — B369 Superficial mycosis, unspecified: Secondary | ICD-10-CM

## 2019-01-02 ENCOUNTER — Telehealth: Payer: Self-pay | Admitting: *Deleted

## 2019-01-02 NOTE — Telephone Encounter (Signed)
Pt called said she has appt in September needs updated labs orders

## 2019-01-03 NOTE — Telephone Encounter (Signed)
pls re order December labs

## 2019-01-04 ENCOUNTER — Telehealth: Payer: Self-pay

## 2019-01-04 DIAGNOSIS — R7303 Prediabetes: Secondary | ICD-10-CM

## 2019-01-04 DIAGNOSIS — E785 Hyperlipidemia, unspecified: Secondary | ICD-10-CM

## 2019-01-04 DIAGNOSIS — I10 Essential (primary) hypertension: Secondary | ICD-10-CM

## 2019-01-04 NOTE — Telephone Encounter (Signed)
Expired Dec labs re-ordered per MD

## 2019-01-04 NOTE — Telephone Encounter (Signed)
Dec labs re-ordered per MD

## 2019-02-19 ENCOUNTER — Telehealth: Payer: Self-pay | Admitting: *Deleted

## 2019-02-19 NOTE — Telephone Encounter (Signed)
Pt called and needed her cough syrup called in for her asthma. She had ran out but it is now allergy season and she does not want it getting bad with her asthma

## 2019-02-20 ENCOUNTER — Other Ambulatory Visit: Payer: Self-pay | Admitting: Family Medicine

## 2019-02-20 ENCOUNTER — Encounter: Payer: Self-pay | Admitting: Family Medicine

## 2019-02-20 ENCOUNTER — Other Ambulatory Visit: Payer: Self-pay

## 2019-02-20 ENCOUNTER — Ambulatory Visit (INDEPENDENT_AMBULATORY_CARE_PROVIDER_SITE_OTHER): Payer: BC Managed Care – PPO | Admitting: Family Medicine

## 2019-02-20 DIAGNOSIS — J45991 Cough variant asthma: Secondary | ICD-10-CM

## 2019-02-20 DIAGNOSIS — I10 Essential (primary) hypertension: Secondary | ICD-10-CM

## 2019-02-20 DIAGNOSIS — E559 Vitamin D deficiency, unspecified: Secondary | ICD-10-CM

## 2019-02-20 DIAGNOSIS — E785 Hyperlipidemia, unspecified: Secondary | ICD-10-CM

## 2019-02-20 MED ORDER — ATORVASTATIN CALCIUM 20 MG PO TABS: 20.0000 mg | ORAL_TABLET | Freq: Every day | ORAL | 5 refills | Status: DC

## 2019-02-20 MED ORDER — UNABLE TO FIND: 0 refills | Status: DC

## 2019-02-20 MED ORDER — MONTELUKAST SODIUM 10 MG PO TABS: 10.0000 mg | ORAL_TABLET | Freq: Every day | ORAL | 3 refills | Status: DC

## 2019-02-20 MED ORDER — HYDROCOD POLST-CPM POLST ER 10-8 MG/5ML PO SUER: ORAL | 0 refills | Status: DC

## 2019-02-20 MED ORDER — ALBUTEROL SULFATE HFA 108 (90 BASE) MCG/ACT IN AERS: 2.0000 | INHALATION_SPRAY | Freq: Four times a day (QID) | RESPIRATORY_TRACT | 1 refills | Status: DC | PRN

## 2019-02-20 MED ORDER — TRIAMTERENE-HCTZ 75-50 MG PO TABS: 1.0000 | ORAL_TABLET | Freq: Every day | ORAL | 5 refills | Status: DC

## 2019-02-20 MED ORDER — ALBUTEROL SULFATE 1.25 MG/3ML IN NEBU: 1.0000 | INHALATION_SOLUTION | Freq: Four times a day (QID) | RESPIRATORY_TRACT | 5 refills | Status: DC | PRN

## 2019-02-20 MED ORDER — ERGOCALCIFEROL 1.25 MG (50000 UT) PO CAPS: 50000.0000 [IU] | ORAL_CAPSULE | ORAL | 1 refills | Status: DC

## 2019-02-20 NOTE — Progress Notes (Signed)
Virtual Visit via Telephone Note   This visit type was conducted due to national recommendations for restrictions regarding the COVID-19 Pandemic (e.g. social distancing) in an effort to limit this patient's exposure and mitigate transmission in our community.  Due to her co-morbid illnesses, this patient is at least at moderate risk for complications without adequate follow up.  This format is felt to be most appropriate for this patient at this time.  The patient did not have access to video technology/had technical difficulties with video requiring transitioning to audio format only (telephone).  All issues noted in this document were discussed and addressed.  No physical exam could be performed with this format.    Evaluation Performed:  Follow-up visit  Date:  02/20/2019   ID:  Brandy Dean, DOB 06-01-1951, MRN [id number]  Patient Location: Home Provider Location: Office  Location of Patient: Home Location of Provider: Telehealth Consent was obtain for visit to be over via telehealth. I verified that I am speaking with the correct person using two identifiers.  PCP:  Fayrene Helper, MD   Chief Complaint:  Chronic conditions  History of Present Illness:    Brandy Dean is a 68 y.o. female with   Here for follow-up of hypertension. She  is not exercising.   She reports  is adherent to a low-salt diet.  She does not have a blood pressure cuff at home right now.  Cardiac symptoms: none. Patient denies: chest pain, chest pressure/discomfort, claudication, dyspnea, exertional chest pressure/discomfort, fatigue, irregular heart beat, lower extremity edema, near-syncope, orthopnea, palpitations, paroxysmal nocturnal dyspnea, syncope and tachypnea. Cardiovascular risk factors: advanced age (older than 15 for men, 57 for women), dyslipidemia, hypertension, obesity (BMI >= 30 kg/m2) and sedentary lifestyle. Use of agents associated with hypertension: none.   Reports taking all  medications as directed and denies side effects.  Reports eating poorly over the last year. Reports feeling like her clothes are fitting better. Reports eating los of fruits and veggies, but enjoys sweets. Is trying to eat better now that back at work.  Asthma: Reports doing well.  Needs refill on medications.  Denies having cough, shortness of breath, wheezing, difficulties with breathing.  Reports not having to really use her rescue inhaler.  Using her Symbicort as directed.  Overall today she has no complaints and feels like she is doing well.  As previously stated she reports that she forgot her labs at the last appointment.  She is advised to get these labs so that we can figure out if she needs any adjustment to her medications.  Additionally she will be coming in for a flu shot.  The patient does not have symptoms concerning for COVID-19 infection (fever, chills, cough, or new shortness of breath).    Past Medical History:  Diagnosis Date  . Bacterial pneumonia    March  9-12,  2011  . Bronchial asthma   . Essential hypertension, benign   . Morbid obesity ([place])   . Sleep apnea    Past Surgical History:  Procedure Laterality Date  . [place]  . Partial left lobectomy  1968  . RIght and left keloid ears  1968     Current Meds  Medication Sig  . albuterol (ACCUNEB) 1.25 MG/3ML nebulizer solution Take 3 mLs (1.25 mg total) by nebulization every 6 (six) hours as needed for wheezing.  Marland Kitchen albuterol (VENTOLIN HFA) 108 (90 Base) MCG/ACT inhaler Inhale 2 puffs into  the lungs every 6 (six) hours as needed for wheezing or shortness of breath.  Marland Kitchen. atorvastatin (LIPITOR) 20 MG tablet Take 1 tablet (20 mg total) by mouth daily.  . chlorpheniramine-HYDROcodone (TUSSIONEX PENNKINETIC ER) 10-8 MG/5ML SUER Take one teaspoon two times daily , by mouth, as needed, for excess cough  . chlorpheniramine-HYDROcodone (TUSSIONEX PENNKINETIC ER) 10-8 MG/5ML SUER Take one teaspoon by  mouth two times daily for excess cough  . clotrimazole-betamethasone (LOTRISONE) cream APPLY EXTERNALLY TO THE AFFECTED AREA TWICE DAILY  . ergocalciferol (VITAMIN D2) 1.25 MG (50000 UT) capsule Take 1 capsule (50,000 Units total) by mouth once a week. One capsule once weekly  . montelukast (SINGULAIR) 10 MG tablet Take 1 tablet (10 mg total) by mouth at bedtime.  . SYMBICORT 160-4.5 MCG/ACT inhaler Inhale 2 puffs into the lungs 2 (two) times daily.  Marland Kitchen. triamterene-hydrochlorothiazide (MAXZIDE) 75-50 MG tablet Take 1 tablet by mouth daily.  Marland Kitchen. UNABLE TO FIND Nebulizer machine (with 2 tubings and mouthpiece) Dx asthma J45.991  . [DISCONTINUED] albuterol (ACCUNEB) 1.25 MG/3ML nebulizer solution Take 3 mLs (1.25 mg total) by nebulization every 6 (six) hours as needed for wheezing.  . [DISCONTINUED] albuterol (PROVENTIL HFA;VENTOLIN HFA) 108 (90 Base) MCG/ACT inhaler Inhale 2 puffs every 6 (six) hours as needed into the lungs for wheezing or shortness of breath.  . [DISCONTINUED] atorvastatin (LIPITOR) 20 MG tablet Take 1 tablet (20 mg total) by mouth daily.  . [DISCONTINUED] ergocalciferol (VITAMIN D2) 1.25 MG (50000 UT) capsule Take 1 capsule (50,000 Units total) by mouth once a week. One capsule once weekly  . [DISCONTINUED] montelukast (SINGULAIR) 10 MG tablet Take 1 tablet (10 mg total) by mouth at bedtime.  . [DISCONTINUED] triamterene-hydrochlorothiazide (MAXZIDE) 75-50 MG tablet TAKE 1 TABLET BY MOUTH EVERY DAY     Allergies:   Benzonatate   Social History   Tobacco Use  . Smoking status: Former Smoker    Types: Cigarettes    Quit date: 06/14/2007    Years since quitting: 11.6  . Smokeless tobacco: Never Used  Substance Use Topics  . Alcohol use: No  . Drug use: No     Family Hx: The patient's family history includes Esophageal cancer in her father; Hypertension in her mother; Obesity in her sister; Stroke in her mother.  ROS:   Please see the history of present illness.    All  other systems reviewed and are negative.   Labs/Other Tests and Data Reviewed:   Recent Labs: No results found for requested labs within last 8760 hours.   Recent Lipid Panel Lab Results  Component Value Date/Time   CHOL 236 (H) 12/07/2017 08:53 AM   TRIG 103 12/07/2017 08:53 AM   HDL 47 (L) 12/07/2017 08:53 AM   CHOLHDL 5.0 (H) 12/07/2017 08:53 AM   LDLCALC 167 (H) 12/07/2017 08:53 AM    Wt Readings from Last 3 Encounters:  10/17/18 (!) 311 lb (141.1 kg)  07/04/18 (!) 304 lb (137.9 kg)  06/12/18 (!) 311 lb 1.9 oz (141.1 kg)     Objective:    Vital Signs:  There were no vitals taken for this visit.   GEN:  alert and oriented RESPIRATORY:  not short of breath in conversation PSYCH:  normal affect and mood  ASSESSMENT & PLAN:    1. Asthma, cough variant Controlled, continue current medication.  Refills provided today.  - albuterol (ACCUNEB) 1.25 MG/3ML nebulizer solution; Take 3 mLs (1.25 mg total) by nebulization every 6 (six) hours as needed for wheezing.  Dispense: 75 mL; Refill: 5 - montelukast (SINGULAIR) 10 MG tablet; Take 1 tablet (10 mg total) by mouth at bedtime.  Dispense: 30 tablet; Refill: 3  2. Hyperlipidemia with target LDL less than 100 Needs updated labs.  Labs of already been placed.  Will come in to get those in the next couple weeks.  As well as flu shot.  As demonstrated previously not well controlled.  Reports taking her medication as addressed.  Might need adjustment with the increase.  Provided with education on a low-fat heart healthy diet.  In addition to exercise.  - atorvastatin (LIPITOR) 20 MG tablet; Take 1 tablet (20 mg total) by mouth daily.  Dispense: 30 tablet; Refill: 5  3. Vitamin D deficiency Controlled, provided with refills. - ergocalciferol (VITAMIN D2) 1.25 MG (50000 UT) capsule; Take 1 capsule (50,000 Units total) by mouth once a week. One capsule once weekly  Dispense: 12 capsule; Refill: 1  4. Essential hypertension Does not  have blood pressure cuff at home.  Would like to see if insurance will help cover 1 of these blood pressure cuffs.  Will order 1 of those today.  Will assess in office at next visit.  Advised to continue current medication at this time.  Previous history demonstrated control.  - triamterene-hydrochlorothiazide (MAXZIDE) 75-50 MG tablet; Take 1 tablet by mouth daily.  Dispense: 30 tablet; Refill: 5   Time:   Today, I have spent 10 minutes with the patient with telehealth technology discussing the above problems.     Medication Adjustments/Labs and Tests Ordered: Current medicines are reviewed at length with the patient today.  Concerns regarding medicines are outlined above.   Tests Ordered: No orders of the defined types were placed in this encounter.   Medication Changes: Meds ordered this encounter  Medications  . albuterol (ACCUNEB) 1.25 MG/3ML nebulizer solution    Sig: Take 3 mLs (1.25 mg total) by nebulization every 6 (six) hours as needed for wheezing.    Dispense:  75 mL    Refill:  5  . albuterol (VENTOLIN HFA) 108 (90 Base) MCG/ACT inhaler    Sig: Inhale 2 puffs into the lungs every 6 (six) hours as needed for wheezing or shortness of breath.    Dispense:  18 g    Refill:  1  . atorvastatin (LIPITOR) 20 MG tablet    Sig: Take 1 tablet (20 mg total) by mouth daily.    Dispense:  30 tablet    Refill:  5  . ergocalciferol (VITAMIN D2) 1.25 MG (50000 UT) capsule    Sig: Take 1 capsule (50,000 Units total) by mouth once a week. One capsule once weekly    Dispense:  12 capsule    Refill:  1  . montelukast (SINGULAIR) 10 MG tablet    Sig: Take 1 tablet (10 mg total) by mouth at bedtime.    Dispense:  30 tablet    Refill:  3  . triamterene-hydrochlorothiazide (MAXZIDE) 75-50 MG tablet    Sig: Take 1 tablet by mouth daily.    Dispense:  30 tablet    Refill:  5    Disposition:  Follow up Jan 2021, flu clinic as well  Signed, Freddy Finner, NP  02/20/2019 3:08 PM      [facility]

## 2019-02-20 NOTE — Patient Instructions (Addendum)
    Thank you for completing your visit via telephone. I appreciate the opportunity to provide you with the care for your health and wellness.  Today we discussed: Overall health  Follow-up: You have 2 appointments, one is for the flu shot and your labs; the other will be for follow-up that will be in January 2021.  Please get your labs fasting.  You can get these on the same day that you come in for your flu shot.  Please continue to work on your diet and implementing overall lifestyle changes.  Strongly recommend getting 30 minutes of exercise outside of work and cleaning the house.  Look forward to seeing you in the coming months.  Hope you stay safe and healthy through the fall and winter.  Please continue to practice social distancing to keep you, your family, and our community safe.  If you must go out, please wear a Mask and practice good handwashing.  [place] YOUR HANDS WELL AND FREQUENTLY. AVOID TOUCHING YOUR FACE, UNLESS YOUR HANDS ARE FRESHLY WASHED.  GET FRESH AIR DAILY. STAY HYDRATED WITH WATER.   It was a pleasure to see you and I look forward to continuing to work together on your health and well-being. Please do not hesitate to call the office if you need care or have questions about your care.  Have a wonderful day and week. With Gratitude, Cherly Beach, DNP, AGNP-BC

## 2019-02-20 NOTE — Telephone Encounter (Signed)
Called pt to make appt and made her aware it is 10-14

## 2019-02-20 NOTE — Telephone Encounter (Signed)
medication requested is sent in and pt is awareI spoke directly to the pt.  Please schedule in office appointment with me first week in October.Will get flu and pneumonia  Vaccines at that visit  Remind her that she needs to get the fasting labs ordered 5 to 7 days before the visit please

## 2019-02-25 ENCOUNTER — Telehealth: Payer: Self-pay | Admitting: Family Medicine

## 2019-02-25 NOTE — Telephone Encounter (Signed)
Pt was seen 09/09 for asthma

## 2019-02-25 NOTE — Telephone Encounter (Signed)
Pt needs a note for 9-10 & 9-11 she couldn't work as her asthma was bad

## 2019-02-26 ENCOUNTER — Telehealth: Payer: Self-pay | Admitting: *Deleted

## 2019-02-26 NOTE — Telephone Encounter (Signed)
Done and pt aware

## 2019-02-26 NOTE — Telephone Encounter (Signed)
Pt called needing a work note for Thursday and Friday. Brandy Dean she was in the office on Wednesday due to her asthma and breathing but now she needs a note for being out the other two days. Requesting a call when this is ready to be picked up

## 2019-02-26 NOTE — Telephone Encounter (Signed)
Please advise 

## 2019-03-18 ENCOUNTER — Ambulatory Visit (INDEPENDENT_AMBULATORY_CARE_PROVIDER_SITE_OTHER): Payer: BC Managed Care – PPO | Admitting: Family Medicine

## 2019-03-18 ENCOUNTER — Other Ambulatory Visit: Payer: Self-pay

## 2019-03-18 ENCOUNTER — Encounter: Payer: Self-pay | Admitting: Family Medicine

## 2019-03-18 VITALS — BP 118/82 | Ht 64.0 in | Wt 311.0 lb

## 2019-03-18 DIAGNOSIS — R7303 Prediabetes: Secondary | ICD-10-CM | POA: Diagnosis not present

## 2019-03-18 DIAGNOSIS — Z6841 Body Mass Index (BMI) 40.0 and over, adult: Secondary | ICD-10-CM

## 2019-03-18 DIAGNOSIS — J209 Acute bronchitis, unspecified: Secondary | ICD-10-CM

## 2019-03-18 DIAGNOSIS — J45991 Cough variant asthma: Secondary | ICD-10-CM

## 2019-03-18 DIAGNOSIS — I10 Essential (primary) hypertension: Secondary | ICD-10-CM

## 2019-03-18 DIAGNOSIS — E785 Hyperlipidemia, unspecified: Secondary | ICD-10-CM | POA: Diagnosis not present

## 2019-03-18 MED ORDER — PENICILLIN V POTASSIUM 500 MG PO TABS: 500.0000 mg | ORAL_TABLET | Freq: Three times a day (TID) | ORAL | 0 refills | Status: DC

## 2019-03-18 NOTE — Patient Instructions (Signed)
Please keep appopintment as before, call if you need me before  Please get covid test today  Penicillin is prescribed, use robitussin  As decongestant , and you have cough suppressant at night  Fasting CBC, lipid, cmp and eGFr and TSH  This week Thursday, also CXR this week Thursday

## 2019-03-18 NOTE — Progress Notes (Signed)
Virtual Visit via Telephone Note  I connected with Brandy Dean on 03/18/19 at 10:00 AM EDT by telephone and verified that I am speaking with the correct person using two identifiers.  Location: Patient: home Provider: Office   I discussed the limitations, risks, security and privacy concerns of performing an evaluation and management service by telephone and the availability of in person appointments. I also discussed with the patient that there may be a patient responsible charge related to this service. The patient expressed understanding and agreed to proceed.   History of Present Illness: 3 days ago exposed to fellow passenger coughing, thew next  Day started having fever and chills, and yellow sputum , no body aches Diarrhea since yesterday, had 3 bowel movements , no blood or mucus.in stool, 2 since today and still somewhat watery HAd  Wheezing . Denies sinus pressure, nasal congestion, ear pain or sore throat. Denies chest pains, palpitations and leg swelling Denies abdominal pain, nausea, vomiting,diarrhea or constipation.   Denies dysuria, frequency, hesitancy or incontinence. C/o chronic  joint pain, swelling and limitation in mobility. Denies headaches, seizures, numbness, or tingling. Denies depression, anxiety or insomnia. Denies skin break down or rash.     Observations/Objective: BP 118/82   Ht 5\' 4"  (1.626 m)   Wt (!) 311 lb (141.1 kg)   BMI 53.38 kg/m  Good communication with no confusion and intact memory. Alert and oriented x 3 No signs of respiratory distress during speech    Assessment and Plan:  Acute bronchitis Penicillin , decongestant and cough suppressant prescribed Needs covid test today  Essential hypertension Controlled, no change in medication   Morbid obesity with BMI of 50.0-59.9, adult  Patient re-educated about  the importance of commitment to a  minimum of 150 minutes of exercise per week as able.  The importance of healthy  food choices with portion control discussed, as well as eating regularly and within a 12 hour window most days. The need to choose "clean , green" food 50 to 75% of the time is discussed, as well as to make water the primary drink and set a goal of 64 ounces water daily.    Weight /BMI 03/18/2019 10/17/2018 07/04/2018  WEIGHT 311 lb 311 lb 304 lb  HEIGHT 5\' 4"  5\' 4"  5\' 4"   BMI 53.38 kg/m2 53.38 kg/m2 52.18 kg/m2      Asthma, cough variant Mild flare with acute infection  Prediabetes Patient educated about the importance of limiting  Carbohydrate intake , the need to commit to daily physical activity for a minimum of 30 minutes , and to commit weight loss. The fact that changes in all these areas will reduce or eliminate all together the development of diabetes is stressed.   Diabetic Labs Latest Ref Rng & Units 12/07/2017 04/20/2017 05/30/2016 07/16/2015 06/23/2015  HbA1c <5.7 % of total Hgb 5.7(H) 5.7(H) 5.7(H) 6.2(H) -  Chol <200 mg/dL 236(H) 231(H) 208(H) - -  HDL >50 mg/dL 47(L) 76 69 - -  Calc LDL mg/dL (calc) 167(H) 138(H) 127(H) - -  Triglycerides <150 mg/dL 103 76 62 - -  Creatinine 0.50 - 0.99 mg/dL 0.95 0.71 0.78 0.60 0.80   BP/Weight 03/18/2019 10/17/2018 07/04/2018 06/12/2018 10/31/2017 09/28/2017 01/21/9146  Systolic BP 829 562 130 865 784 696 295  Diastolic BP 82 82 82 70 84 84 80  Wt. (Lbs) 311 311 304 311.12 315 318 315.5  BMI 53.38 53.38 52.18 53.4 54.07 54.58 55.89   No flowsheet data found.  Updated  lab needed at/ before next visit.    Follow Up Instructions:    I discussed the assessment and treatment plan with the patient. The patient was provided an opportunity to ask questions and all were answered. The patient agreed with the plan and demonstrated an understanding of the instructions.   The patient was advised to call back or seek an in-person evaluation if the symptoms worsen or if the condition fails to improve as anticipated.  I provided 22 minutes of  non-face-to-face time during this encounter.   Syliva Overman, MD

## 2019-03-19 ENCOUNTER — Telehealth: Payer: Self-pay | Admitting: *Deleted

## 2019-03-19 ENCOUNTER — Other Ambulatory Visit: Payer: Self-pay

## 2019-03-19 ENCOUNTER — Encounter: Payer: Self-pay | Admitting: Family Medicine

## 2019-03-19 DIAGNOSIS — R0602 Shortness of breath: Secondary | ICD-10-CM

## 2019-03-19 DIAGNOSIS — Z20822 Contact with and (suspected) exposure to covid-19: Secondary | ICD-10-CM

## 2019-03-19 DIAGNOSIS — J45991 Cough variant asthma: Secondary | ICD-10-CM

## 2019-03-19 MED ORDER — UNABLE TO FIND: 0 refills | Status: DC

## 2019-03-19 NOTE — Assessment & Plan Note (Signed)
Patient educated about the importance of limiting  Carbohydrate intake , the need to commit to daily physical activity for a minimum of 30 minutes , and to commit weight loss. The fact that changes in all these areas will reduce or eliminate all together the development of diabetes is stressed.   Diabetic Labs Latest Ref Rng & Units 12/07/2017 04/20/2017 05/30/2016 07/16/2015 06/23/2015  HbA1c <5.7 % of total Hgb 5.7(H) 5.7(H) 5.7(H) 6.2(H) -  Chol <200 mg/dL 236(H) 231(H) 208(H) - -  HDL >50 mg/dL 47(L) 76 69 - -  Calc LDL mg/dL (calc) 167(H) 138(H) 127(H) - -  Triglycerides <150 mg/dL 103 76 62 - -  Creatinine 0.50 - 0.99 mg/dL 0.95 0.71 0.78 0.60 0.80   BP/Weight 03/18/2019 10/17/2018 07/04/2018 06/12/2018 10/31/2017 09/28/2017 5/36/4680  Systolic BP 321 224 825 003 704 888 916  Diastolic BP 82 82 82 70 84 84 80  Wt. (Lbs) 311 311 304 311.12 315 318 315.5  BMI 53.38 53.38 52.18 53.4 54.07 54.58 55.89   No flowsheet data found.  Updated lab needed at/ before next visit.

## 2019-03-19 NOTE — Assessment & Plan Note (Signed)
  Patient re-educated about  the importance of commitment to a  minimum of 150 minutes of exercise per week as able.  The importance of healthy food choices with portion control discussed, as well as eating regularly and within a 12 hour window most days. The need to choose "clean , green" food 50 to 75% of the time is discussed, as well as to make water the primary drink and set a goal of 64 ounces water daily.    Weight /BMI 03/18/2019 10/17/2018 07/04/2018  WEIGHT 311 lb 311 lb 304 lb  HEIGHT 5\' 4"  5\' 4"  5\' 4"   BMI 53.38 kg/m2 53.38 kg/m2 52.18 kg/m2

## 2019-03-19 NOTE — Assessment & Plan Note (Signed)
Mild flare with acute infection

## 2019-03-19 NOTE — Telephone Encounter (Signed)
Sent to [facility] 

## 2019-03-19 NOTE — Assessment & Plan Note (Signed)
Controlled, no change in medication  

## 2019-03-19 NOTE — Telephone Encounter (Signed)
Pt called needing a new nebulizer her machine is not working this can be sent to [facility]

## 2019-03-19 NOTE — Assessment & Plan Note (Signed)
Penicillin , decongestant and cough suppressant prescribed Needs covid test today

## 2019-03-21 ENCOUNTER — Telehealth: Payer: Self-pay | Admitting: General Practice

## 2019-03-21 LAB — NOVEL CORONAVIRUS, NAA: SARS-CoV-2, NAA: NOT DETECTED

## 2019-03-21 NOTE — Telephone Encounter (Signed)
Negative COVID results given. Patient results "NOT Detected." Caller expressed understanding. ° °

## 2019-03-22 LAB — CBC
HCT: 42.6 % (ref 35.0–45.0)
Hemoglobin: 13.5 g/dL (ref 11.7–15.5)
MCH: 26.6 pg — ABNORMAL LOW (ref 27.0–33.0)
MCHC: 31.7 g/dL — ABNORMAL LOW (ref 32.0–36.0)
MCV: 83.9 fL (ref 80.0–100.0)
MPV: 11.3 fL (ref 7.5–12.5)
Platelets: 238 10*3/uL (ref 140–400)
RBC: 5.08 10*6/uL (ref 3.80–5.10)
RDW: 14.1 % (ref 11.0–15.0)
WBC: 7.6 10*3/uL (ref 3.8–10.8)

## 2019-03-22 LAB — COMPLETE METABOLIC PANEL WITH GFR
AG Ratio: 1.5 (calc) (ref 1.0–2.5)
ALT: 10 U/L (ref 6–29)
AST: 14 U/L (ref 10–35)
Albumin: 4.5 g/dL (ref 3.6–5.1)
Alkaline phosphatase (APISO): 52 U/L (ref 37–153)
BUN: 14 mg/dL (ref 7–25)
CO2: 35 mmol/L — ABNORMAL HIGH (ref 20–32)
Calcium: 10.5 mg/dL — ABNORMAL HIGH (ref 8.6–10.4)
Chloride: 99 mmol/L (ref 98–110)
Creat: 0.77 mg/dL (ref 0.50–0.99)
GFR, Est African American: 93 mL/min/{1.73_m2} (ref >=60)
GFR, Est Non African American: 80 mL/min/{1.73_m2} (ref >=60)
Globulin: 3 g/dL (calc) (ref 1.9–3.7)
Glucose, Bld: 101 mg/dL — ABNORMAL HIGH (ref 65–99)
Potassium: 4.5 mmol/L (ref 3.5–5.3)
Sodium: 141 mmol/L (ref 135–146)
Total Bilirubin: 0.5 mg/dL (ref 0.2–1.2)
Total Protein: 7.5 g/dL (ref 6.1–8.1)

## 2019-03-22 LAB — LIPID PANEL
Cholesterol: 233 mg/dL — ABNORMAL HIGH (ref <200)
HDL: 72 mg/dL (ref >=50)
LDL Cholesterol (Calc): 142 mg/dL (calc) — ABNORMAL HIGH
Non-HDL Cholesterol (Calc): 161 mg/dL (calc) — ABNORMAL HIGH (ref <130)
Total CHOL/HDL Ratio: 3.2 (calc) (ref <5.0)
Triglycerides: 83 mg/dL (ref <150)

## 2019-03-22 LAB — TSH: TSH: 1.67 mIU/L (ref 0.40–4.50)

## 2019-03-25 ENCOUNTER — Other Ambulatory Visit: Payer: Self-pay

## 2019-03-25 ENCOUNTER — Emergency Department (HOSPITAL_COMMUNITY)
Admission: EM | Admit: 2019-03-25 | Discharge: 2019-03-25 | Disposition: A | Discharge status: Home / Self Care | Payer: BC Managed Care – PPO | Attending: Emergency Medicine | Admitting: Emergency Medicine

## 2019-03-25 ENCOUNTER — Encounter (HOSPITAL_COMMUNITY): Payer: Self-pay | Admitting: Emergency Medicine

## 2019-03-25 ENCOUNTER — Emergency Department (HOSPITAL_COMMUNITY): Payer: BC Managed Care – PPO

## 2019-03-25 DIAGNOSIS — I10 Essential (primary) hypertension: Secondary | ICD-10-CM | POA: Diagnosis not present

## 2019-03-25 DIAGNOSIS — J4521 Mild intermittent asthma with (acute) exacerbation: Secondary | ICD-10-CM | POA: Diagnosis not present

## 2019-03-25 DIAGNOSIS — R0602 Shortness of breath: Secondary | ICD-10-CM | POA: Diagnosis present

## 2019-03-25 DIAGNOSIS — Z79899 Other long term (current) drug therapy: Secondary | ICD-10-CM | POA: Diagnosis not present

## 2019-03-25 DIAGNOSIS — R05 Cough: Secondary | ICD-10-CM | POA: Insufficient documentation

## 2019-03-25 DIAGNOSIS — J069 Acute upper respiratory infection, unspecified: Secondary | ICD-10-CM | POA: Diagnosis not present

## 2019-03-25 MED ORDER — PREDNISONE 20 MG PO TABS: 60.0000 mg | ORAL_TABLET | Freq: Every day | ORAL | 0 refills | Status: DC

## 2019-03-25 MED ORDER — PREDNISONE 50 MG PO TABS
60.0000 mg | ORAL_TABLET | Freq: Once | ORAL | Status: AC
  Administered 2019-03-25: 60 mg via ORAL
  Filled 2019-03-25: qty 1

## 2019-03-25 MED ORDER — ALBUTEROL SULFATE HFA 108 (90 BASE) MCG/ACT IN AERS: 2.0000 | INHALATION_SPRAY | RESPIRATORY_TRACT | 3 refills | Status: DC | PRN

## 2019-03-25 MED ORDER — IPRATROPIUM BROMIDE 0.02 % IN SOLN
0.5000 mL | Freq: Once | RESPIRATORY_TRACT | Status: AC
  Administered 2019-03-25: 0.1 mg via RESPIRATORY_TRACT
  Filled 2019-03-25: qty 2.5

## 2019-03-25 MED ORDER — ALBUTEROL SULFATE HFA 108 (90 BASE) MCG/ACT IN AERS
4.0000 | INHALATION_SPRAY | Freq: Once | RESPIRATORY_TRACT | Status: AC
  Administered 2019-03-25: 4 via RESPIRATORY_TRACT
  Filled 2019-03-25: qty 6.7

## 2019-03-25 MED ORDER — IPRATROPIUM BROMIDE 0.02 % IN SOLN
2.5000 mL | Freq: Once | RESPIRATORY_TRACT | Status: AC
  Administered 2019-03-25: 0.5 mg via RESPIRATORY_TRACT
  Filled 2019-03-25: qty 2.5

## 2019-03-25 MED ORDER — ALBUTEROL SULFATE HFA 108 (90 BASE) MCG/ACT IN AERS
4.0000 | INHALATION_SPRAY | Freq: Once | RESPIRATORY_TRACT | Status: AC
  Administered 2019-03-25: 11:00:00 4 via RESPIRATORY_TRACT

## 2019-03-25 NOTE — ED Notes (Signed)
Pt verbalized understanding.

## 2019-03-25 NOTE — Discharge Instructions (Addendum)
It was our pleasure to provide your ER care today - we hope that you feel better.  Take prednisone as prescribed. Use albuterol inhaler every 4 hours as need.   Follow up with primary care doctor in 1 week if symptoms fail to improve/resolve.  Return to ER if worse, new symptoms, increased difficulty breathing, chest discomfort, or other concern.

## 2019-03-25 NOTE — ED Provider Notes (Addendum)
[facility] Provider Note   CSN: [id number] Arrival date & time: 03/25/19  [id number]     History   Chief Complaint Chief Complaint  Patient presents with  . Shortness of Breath    HPI Brandy Dean is a 68 y.o. female.     Patient w hx asthma, c/o non prod cough, and increased wheezing/sob in past week. Patient indicates symptoms started last week with acute onset non prod cough and low grade fever +mild congestion, no sore throat. States saw pcp, had labs, covid test negative. No known covid+exposure. Denies recent steroid rx. States is currently still taking antibiotic rx. Denies chest pain or discomfort. No orthopnea or pnd. No leg pain or swelling. Compliant w home meds. Non smoker.   The history is provided by the patient.  Shortness of Breath Associated symptoms: cough and wheezing   Associated symptoms: no abdominal pain, no chest pain, no diaphoresis, no headaches, no neck pain, no rash, no sore throat and no vomiting     Past Medical History:  Diagnosis Date  . Bacterial pneumonia    March  9-12,  2011  . Bronchial asthma   . Essential hypertension, benign   . Morbid obesity (HCC)   . Sleep apnea     Patient Active Problem List   Diagnosis Date Noted  . Acute bronchitis 07/08/2018  . Shoulder pain 06/26/2017  . Dyspepsia 04/23/2017  . Cholelithiasis 07/19/2014  . Nodule of left lung 06/12/2013  . Prediabetes 11/07/2011  . Heart murmur 05/11/2011  . Hyperlipidemia with target LDL less than 100 04/24/2011  . Shortness of breath on exertion 04/20/2011  . Morbid obesity with BMI of 50.0-59.9, adult (HCC) 09/27/2007  . Essential hypertension 09/27/2007  . Asthma, cough variant 09/27/2007    Past Surgical History:  Procedure Laterality Date  . CESAREAN SECTION  1988 & 1991  . Partial left lobectomy  1968  . RIght and left keloid ears  1968     OB History   No obstetric history on file.      Home Medications    Prior to  Admission medications   Medication Sig Start Date End Date Taking? Authorizing Provider  albuterol (ACCUNEB) 1.25 MG/3ML nebulizer solution Take 3 mLs (1.25 mg total) by nebulization every 6 (six) hours as needed for wheezing. 02/20/19   Freddy FinnerMills, Hannah M, NP  albuterol (VENTOLIN HFA) 108 (90 Base) MCG/ACT inhaler Inhale 2 puffs into the lungs every 6 (six) hours as needed for wheezing or shortness of breath. 02/20/19   Freddy FinnerMills, Hannah M, NP  atorvastatin (LIPITOR) 20 MG tablet Take 1 tablet (20 mg total) by mouth daily. 02/20/19   Freddy FinnerMills, Hannah M, NP  chlorpheniramine-HYDROcodone Stevphen Meuse(TUSSIONEX PENNKINETIC ER) 10-8 MG/5ML SUER Take one teaspoon two times daily , by mouth, as needed, for excess cough 10/11/18   Kerri PerchesSimpson, Margaret E, MD  chlorpheniramine-HYDROcodone [facility](TUSSIONEX [facility]) 10-8 MG/5ML SUER Take one teaspoon by mouth two times daily for excess cough 02/20/19   Kerri PerchesSimpson, Margaret E, MD  clotrimazole-betamethasone (LOTRISONE) cream APPLY EXTERNALLY TO THE AFFECTED AREA TWICE DAILY 12/30/18   Freddy FinnerMills, Hannah M, NP  ergocalciferol (VITAMIN D2) 1.25 MG (50000 UT) capsule Take 1 capsule (50,000 Units total) by mouth once a week. One capsule once weekly 02/20/19   Freddy FinnerMills, Hannah M, NP  montelukast (SINGULAIR) 10 MG tablet Take 1 tablet (10 mg total) by mouth at bedtime. 02/20/19   Freddy FinnerMills, Hannah M, NP  penicillin v potassium (VEETID) 500 MG tablet Take 1  tablet (500 mg total) by mouth 3 (three) times daily. 03/18/19   Fayrene Helper, MD  SYMBICORT 160-4.5 MCG/ACT inhaler Inhale 2 puffs into the lungs 2 (two) times daily. 08/03/18   Fayrene Helper, MD  triamterene-hydrochlorothiazide (MAXZIDE) 75-50 MG tablet Take 1 tablet by mouth daily. 02/20/19   Perlie Mayo, NP  UNABLE TO FIND Nebulizer machine (with 2 tubings and mouthpiece) Dx asthma J45.991 02/13/18   Fayrene Helper, MD  UNABLE TO FIND Please provide with BP cuff that insurance will cover. 02/20/19   Perlie Mayo, NP  UNABLE TO FIND Nebulizer  machine x 1  Dx V40.086 03/19/19   Fayrene Helper, MD    Family History Family History  Problem Relation Age of Onset  . Stroke Mother   . Hypertension Mother   . Esophageal cancer Father   . Obesity Sister     Social History Social History   Tobacco Use  . Smoking status: Former Smoker    Types: Cigarettes    Quit date: 06/14/2007    Years since quitting: 11.7  . Smokeless tobacco: Never Used  Substance Use Topics  . Alcohol use: No  . Drug use: No     Allergies   Benzonatate   Review of Systems Review of Systems  Constitutional: Negative for chills and diaphoresis.  HENT: Positive for congestion. Negative for sore throat.   Eyes: Negative for redness.  Respiratory: Positive for cough, shortness of breath and wheezing.   Cardiovascular: Negative for chest pain, palpitations and leg swelling.  Gastrointestinal: Negative for abdominal pain, diarrhea and vomiting.  Genitourinary: Negative for flank pain.  Musculoskeletal: Negative for back pain and neck pain.  Skin: Negative for rash.  Neurological: Negative for headaches.  Hematological: Does not bruise/bleed easily.  Psychiatric/Behavioral: Negative for confusion.     Physical Exam Updated Vital Signs BP 127/76 (BP Location: Right Arm)   Pulse 75   Temp 97.9 F (36.6 C) (Oral)   Resp (!) 25   Ht 1.626 m (5\' 4" )   Wt (!) 139.3 kg   SpO2 93%   BMI 52.70 kg/m   Physical Exam Vitals signs and nursing note reviewed.  Constitutional:      Appearance: Normal appearance. She is well-developed.  HENT:     Nose: Nose normal.     Mouth/Throat:     Mouth: Mucous membranes are moist.     Pharynx: Oropharynx is clear. No oropharyngeal exudate or posterior oropharyngeal erythema.  Eyes:     General: No scleral icterus.    Conjunctiva/sclera: Conjunctivae normal.  Neck:     Musculoskeletal: Normal range of motion and neck supple. No neck rigidity or muscular tenderness.     Trachea: No tracheal deviation.   Cardiovascular:     Rate and Rhythm: Normal rate and regular rhythm.     Pulses: Normal pulses.     Heart sounds: Normal heart sounds. No murmur. No friction rub. No gallop.   Pulmonary:     Effort: Pulmonary effort is normal. No respiratory distress.     Breath sounds: Wheezing present.  Abdominal:     General: Bowel sounds are normal. There is no distension.     Palpations: Abdomen is soft.     Tenderness: There is no abdominal tenderness. There is no guarding.  Genitourinary:    Comments: No cva tenderness.  Musculoskeletal:        General: No swelling or tenderness.     Right lower leg: No edema.  Left lower leg: No edema.  Skin:    General: Skin is warm and dry.     Findings: No rash.  Neurological:     Mental Status: She is alert.     Comments: Alert, speech normal.   Psychiatric:        Mood and Affect: Mood normal.      ED Treatments / Results  Labs (all labs ordered are listed, but only abnormal results are displayed) Results for orders placed or performed in visit on 03/19/19  Novel Coronavirus, NAA ([facility])   Specimen: Nasopharyngeal(NP) swabs in vial transport medium   NASOPHARYNGE  TESTING  Result Value Ref Range   SARS-CoV-2, NAA Not Detected Not Detected   Dg Chest 2 View  Result Date: 03/25/2019 CLINICAL DATA:  Cough, shortness of breath EXAM: CHEST - 2 VIEW COMPARISON:  06/26/2017 FINDINGS: Elevation of the right hemidiaphragm. Linear scarring in the lingula. Heart is mildly enlarged. No acute confluent opacities or effusions. Degenerative changes in the shoulders. No acute bony abnormality. IMPRESSION: Mild elevation of the right hemidiaphragm. Mild cardiomegaly. No active disease. Electronically Signed   By: Charlett Nose M.D.   On: 03/25/2019 10:58    EKG None  Radiology Dg Chest 2 View  Result Date: 03/25/2019 CLINICAL DATA:  Cough, shortness of breath EXAM: CHEST - 2 VIEW COMPARISON:  06/26/2017 FINDINGS: Elevation of the right  hemidiaphragm. Linear scarring in the lingula. Heart is mildly enlarged. No acute confluent opacities or effusions. Degenerative changes in the shoulders. No acute bony abnormality. IMPRESSION: Mild elevation of the right hemidiaphragm. Mild cardiomegaly. No active disease. Electronically Signed   By: Charlett Nose M.D.   On: 03/25/2019 10:58    Procedures Procedures (including critical care time)  Medications Ordered in ED Medications  albuterol (VENTOLIN HFA) 108 (90 Base) MCG/ACT inhaler 4 puff (has no administration in time range)  ipratropium (ATROVENT) nebulizer solution 0.1 mg (has no administration in time range)     Initial Impression / Assessment and Plan / ED Course  I have reviewed the triage vital signs and the nursing notes.  Pertinent labs & imaging results that were available during my care of the patient were reviewed by me and considered in my medical decision making (see chart for details).  Reviewed nursing notes and prior charts for additional history. Reviewed recent labs, covid test neg.   Brandy Dean was evaluated in Emergency Department on 03/25/2019 for the symptoms described in the history of present illness. She was evaluated in the context of the global COVID-19 pandemic, which necessitated consideration that the patient might be at risk for infection with the SARS-CoV-2 virus that causes COVID-19. Institutional protocols and algorithms that pertain to the evaluation of patients at risk for COVID-19 are in a state of rapid change based on information released by regulatory bodies including the CDC and federal and state organizations. These policies and algorithms were followed during the patient's care in the ED.   CXR ordered.   Albuterol and atrovent mdi treatments.   CXR reviewed by me - no pna.   Prednisone po.   Recheck wheezing resolved. Patient feels improved. Pt is breathing comfortably, no pain or discomfort, and currently appears stable for  d/c.   Return precautions discussed.   Final Clinical Impressions(s) / ED Diagnoses   Final diagnoses:  None    ED Discharge Orders    None           Cathren Laine, MD 03/25/19 1244

## 2019-03-25 NOTE — ED Notes (Signed)
Have contacted respiratory

## 2019-03-25 NOTE — ED Notes (Signed)
Per Donna Christen, this had to be changed to a Nebulizer. Per him, they do not do Atrovent inhalers if they're not on precautions.

## 2019-03-25 NOTE — ED Notes (Signed)
Pt states she is breathing better. Is now trying to get up phlegm.

## 2019-03-25 NOTE — ED Triage Notes (Signed)
Shortness of breath for over a week, has been on abx and inhalers with no relief.  resp labored.  Had neg covid results Friday

## 2019-03-25 NOTE — ED Notes (Signed)
Have notified respiratory.  

## 2019-03-25 NOTE — ED Notes (Signed)
Pt to xray

## 2019-03-27 ENCOUNTER — Encounter: Payer: Self-pay | Admitting: Family Medicine

## 2019-03-27 ENCOUNTER — Ambulatory Visit: Payer: BC Managed Care – PPO | Admitting: Family Medicine

## 2019-03-27 ENCOUNTER — Other Ambulatory Visit: Payer: Self-pay

## 2019-03-27 VITALS — BP 130/80 | HR 65 | Resp 15 | Ht 64.0 in | Wt 306.0 lb

## 2019-03-27 DIAGNOSIS — J45991 Cough variant asthma: Secondary | ICD-10-CM | POA: Diagnosis not present

## 2019-03-27 DIAGNOSIS — Z1231 Encounter for screening mammogram for malignant neoplasm of breast: Secondary | ICD-10-CM | POA: Diagnosis not present

## 2019-03-27 DIAGNOSIS — I10 Essential (primary) hypertension: Secondary | ICD-10-CM | POA: Diagnosis not present

## 2019-03-27 DIAGNOSIS — Z23 Encounter for immunization: Secondary | ICD-10-CM

## 2019-03-27 DIAGNOSIS — Z6841 Body Mass Index (BMI) 40.0 and over, adult: Secondary | ICD-10-CM

## 2019-03-27 NOTE — Patient Instructions (Addendum)
F/U in  5 months, call if you need me before   Please schedule mammogram due in January for patient Flu vaccine today and pneumonia 23 vaccines today  Excellent labs today keep up the great work  No med changes  It is important that you exercise regularly at least 30 minutes 5 times a week. If you develop chest pain, have severe difficulty breathing, or feel very tired, stop exercising immediately and seek medical attention     Think about what you will eat, plan ahead. Choose " clean, green, fresh or frozen" over canned, processed or packaged foods which are more sugary, salty and fatty. 70 to 75% of food eaten should be vegetables and fruit. Three meals at set times with snacks allowed between meals, but they must be fruit or vegetables. Aim to eat over a 12 hour period , example 7 am to 7 pm, and STOP after  your last meal of the day. Drink water,generally about 64 ounces per day, no other drink is as healthy. Fruit juice is best enjoyed in a healthy way, by EATING the fruit.

## 2019-03-30 ENCOUNTER — Encounter: Payer: Self-pay | Admitting: Family Medicine

## 2019-03-30 NOTE — Assessment & Plan Note (Signed)
  Patient re-educated about  the importance of commitment to a  minimum of 150 minutes of exercise per week as able.  The importance of healthy food choices with portion control discussed, as well as eating regularly and within a 12 hour window most days. The need to choose "clean , green" food 50 to 75% of the time is discussed, as well as to make water the primary drink and set a goal of 64 ounces water daily.    Weight /BMI 03/27/2019 03/25/2019 03/18/2019  WEIGHT 306 lb 0.6 oz 307 lb 311 lb  HEIGHT 5\' 4"  5\' 4"  5\' 4"   BMI 52.53 kg/m2 52.7 kg/m2 53.38 kg/m2

## 2019-03-30 NOTE — Assessment & Plan Note (Signed)
Controlled, no change in medication  

## 2019-03-30 NOTE — Progress Notes (Signed)
   Brandy Dean     MRN: [id number]      DOB: 02-27-1951   HPI [name] is here for follow up and re-evaluation of chronic medical conditions, medication management and review of any available recent lab and radiology data.  Preventive health is updated, specifically  Cancer screening and Immunization.   Questions or concerns regarding consultations or procedures which the PT has had in the interim are  addressed. The PT denies any adverse reactions to current medications since the last visit.  There are no new concerns.  There are no specific complaints   ROS Denies recent fever or chills. Denies sinus pressure, nasal congestion, ear pain or sore throat. Denies chest congestion, productive cough or wheezing. Denies chest pains, palpitations and leg swelling Denies abdominal pain, nausea, vomiting,diarrhea or constipation.   Denies dysuria, frequency, hesitancy or incontinence. Denies uncontrolled  joint pain, swelling and limitation in mobility. Denies headaches, seizures, numbness, or tingling. Denies depression, anxiety or insomnia. Denies skin break down or rash.   PE  Pulse 65   Resp 15   Ht 5\' 4"  (1.626 m)   Wt (!) 306 lb 0.6 oz (138.8 kg)   SpO2 93%   BMI 52.53 kg/m   Patient alert and oriented and in no cardiopulmonary distress.  HEENT: No facial asymmetry, EOMI,     Neck supple .  Chest: Clear to auscultation bilaterally.  CVS: S1, S2 no murmurs, no S3.Regular rate.  ABD: Soft non tender.   Ext: No edema  MS: Adequate ROM spine, shoulders, hips and knees.  Skin: Intact, no ulcerations or rash noted.  Psych: Good eye contact, normal affect. Memory intact not anxious or depressed appearing.  CNS: CN 2-12 intact, power,  normal throughout.no focal deficits noted.   Assessment & Plan  Essential hypertension Controlled, no change in medication DASH diet and commitment to daily physical activity for a minimum of 30 minutes discussed and encouraged, as a  part of hypertension management. The importance of attaining a healthy weight is also discussed.  BP/Weight 03/27/2019 03/25/2019 03/18/2019 10/17/2018 07/04/2018 06/12/2018 08/26/1759  Systolic BP - 607 371 062 694 854 627  Diastolic BP - 78 82 82 82 70 84  Wt. (Lbs) 306.04 307 311 311 304 311.12 315  BMI 52.53 52.7 53.38 53.38 52.18 53.4 54.07       Morbid obesity with BMI of 50.0-59.9, adult  Patient re-educated about  the importance of commitment to a  minimum of 150 minutes of exercise per week as able.  The importance of healthy food choices with portion control discussed, as well as eating regularly and within a 12 hour window most days. The need to choose "clean , green" food 50 to 75% of the time is discussed, as well as to make water the primary drink and set a goal of 64 ounces water daily.    Weight /BMI 03/27/2019 03/25/2019 03/18/2019  WEIGHT 306 lb 0.6 oz 307 lb 311 lb  HEIGHT 5\' 4"  5\' 4"  5\' 4"   BMI 52.53 kg/m2 52.7 kg/m2 53.38 kg/m2      Asthma, cough variant Controlled, no change in medication

## 2019-03-30 NOTE — Assessment & Plan Note (Signed)
Controlled, no change in medication DASH diet and commitment to daily physical activity for a minimum of 30 minutes discussed and encouraged, as a part of hypertension management. The importance of attaining a healthy weight is also discussed.  BP/Weight 03/27/2019 03/25/2019 03/18/2019 10/17/2018 07/04/2018 06/12/2018 12/20/2955  Systolic BP - 473 403 709 643 838 184  Diastolic BP - 78 82 82 82 70 84  Wt. (Lbs) 306.04 307 311 311 304 311.12 315  BMI 52.53 52.7 53.38 53.38 52.18 53.4 54.07

## 2019-06-20 ENCOUNTER — Other Ambulatory Visit: Payer: Self-pay

## 2019-06-20 MED ORDER — ALBUTEROL SULFATE HFA 108 (90 BASE) MCG/ACT IN AERS: 2.0000 | INHALATION_SPRAY | Freq: Four times a day (QID) | RESPIRATORY_TRACT | 1 refills | Status: DC | PRN

## 2019-06-28 ENCOUNTER — Inpatient Hospital Stay (HOSPITAL_COMMUNITY): Admission: RE | Admit: 2019-06-28 | Payer: BC Managed Care – PPO | Source: Ambulatory Visit

## 2019-06-28 ENCOUNTER — Ambulatory Visit (HOSPITAL_COMMUNITY): Payer: BC Managed Care – PPO

## 2019-07-04 ENCOUNTER — Encounter: Payer: Self-pay | Admitting: Family Medicine

## 2019-07-04 ENCOUNTER — Ambulatory Visit (INDEPENDENT_AMBULATORY_CARE_PROVIDER_SITE_OTHER): Payer: BC Managed Care – PPO | Admitting: Family Medicine

## 2019-07-04 ENCOUNTER — Other Ambulatory Visit: Payer: Self-pay

## 2019-07-04 VITALS — BP 130/80 | Ht 64.0 in | Wt 306.0 lb

## 2019-07-04 DIAGNOSIS — U071 COVID-19: Secondary | ICD-10-CM | POA: Diagnosis not present

## 2019-07-04 DIAGNOSIS — E559 Vitamin D deficiency, unspecified: Secondary | ICD-10-CM

## 2019-07-04 DIAGNOSIS — Z6841 Body Mass Index (BMI) 40.0 and over, adult: Secondary | ICD-10-CM

## 2019-07-04 DIAGNOSIS — E785 Hyperlipidemia, unspecified: Secondary | ICD-10-CM

## 2019-07-04 DIAGNOSIS — R7301 Impaired fasting glucose: Secondary | ICD-10-CM

## 2019-07-04 DIAGNOSIS — J019 Acute sinusitis, unspecified: Secondary | ICD-10-CM

## 2019-07-04 DIAGNOSIS — I10 Essential (primary) hypertension: Secondary | ICD-10-CM | POA: Diagnosis not present

## 2019-07-04 DIAGNOSIS — J45991 Cough variant asthma: Secondary | ICD-10-CM

## 2019-07-04 MED ORDER — DIPHENOXYLATE-ATROPINE 2.5-0.025 MG PO TABS: 1.0000 | ORAL_TABLET | Freq: Four times a day (QID) | ORAL | 0 refills | Status: DC | PRN

## 2019-07-04 MED ORDER — PREDNISONE 5 MG (21) PO TBPK: 5.0000 mg | ORAL_TABLET | ORAL | 0 refills | Status: DC

## 2019-07-04 MED ORDER — PENICILLIN V POTASSIUM 500 MG PO TABS: 500.0000 mg | ORAL_TABLET | Freq: Three times a day (TID) | ORAL | 0 refills | Status: DC

## 2019-07-04 MED ORDER — HYDROCOD POLST-CPM POLST ER 10-8 MG/5ML PO SUER: ORAL | 0 refills | Status: DC

## 2019-07-04 NOTE — Progress Notes (Signed)
Virtual Visit via Telephone Note  I connected with Brandy Dean on 07/04/19 at  9:40 AM EST by telephone and verified that I am speaking with the correct person using two identifiers.  Location: Patient: home Provider: Office    I discussed the limitations, risks, security and privacy concerns of performing an evaluation and management service by telephone and the availability of in person appointments. I also discussed with the patient that there may be a patient responsible charge related to this service. The patient expressed understanding and agreed to proceed.   History of Present Illness: 5 DAY H/O CHILLS , FEVER , BODY ACHES, sinus pressure , burning nose, sinus pressure , no sore throat , no ear pain, sOB last 2  week  occurs wiith activity, 3 loose stool   Observations/Objective: BP 130/80   Ht 5\' 4"  (1.626 m)   Wt (!) 306 lb (138.8 kg)   BMI 52.52 kg/m  Good communication with no confusion and intact memory. Alert and oriented x 3 Cough and mild  respiratory distress during speech    Assessment and Plan: Sinusitis Penicillin, prednisone prescribed  Asthma, cough variant Increased cough, tussionex and prednisone prescribed  Essential hypertension Controlled, no change in medication   COVID-19 Probable sinus infection, needs to self quarantine until result available, Covid education provided     Follow Up Instructions:    I discussed the assessment and treatment plan with the patient. The patient was provided an opportunity to ask questions and all were answered. The patient agreed with the plan and demonstrated an understanding of the instructions.   The patient was advised to call back or seek an in-person evaluation if the symptoms worsen or if the condition fails to improve as anticipated.  I provided 20 minutes of non-face-to-face time during this encounter.   , MD

## 2019-07-04 NOTE — Patient Instructions (Addendum)
Keep f/ u appointment in March as before, vall if you need me sooner  You are treated with penicillin, and short course of prednisone, as well as lomotil and tessionex syrup , as discussed.  Need to get tested for covid and self quarantine until result is available  Please get fasting lipid, cmp and eGFr, and hBa1C and vit D 1 week before March appt ( orders to be mailed)  Thanks for choosing [facility], we consider it a privelige to serve you.

## 2019-07-05 ENCOUNTER — Telehealth: Payer: Self-pay | Admitting: *Deleted

## 2019-07-05 ENCOUNTER — Other Ambulatory Visit: Payer: BC Managed Care – PPO

## 2019-07-05 NOTE — Telephone Encounter (Signed)
Pt wanted to let dr simpson know that she got her covid results and they were positive

## 2019-07-05 NOTE — Telephone Encounter (Signed)
noted 

## 2019-07-07 ENCOUNTER — Telehealth: Payer: Self-pay | Admitting: Family Medicine

## 2019-07-07 ENCOUNTER — Encounter: Payer: Self-pay | Admitting: Family Medicine

## 2019-07-07 DIAGNOSIS — J329 Chronic sinusitis, unspecified: Secondary | ICD-10-CM | POA: Insufficient documentation

## 2019-07-07 DIAGNOSIS — U071 COVID-19: Secondary | ICD-10-CM | POA: Insufficient documentation

## 2019-07-07 NOTE — Telephone Encounter (Signed)
Needs phone f/u for covid infection , first week in Feb please sched

## 2019-07-07 NOTE — Assessment & Plan Note (Signed)
Penicillin, prednisone prescribed

## 2019-07-07 NOTE — Assessment & Plan Note (Signed)
Increased cough, tussionex and prednisone prescribed

## 2019-07-07 NOTE — Assessment & Plan Note (Signed)
Probable sinus infection, needs to self quarantine until result available, Covid education provided

## 2019-07-07 NOTE — Assessment & Plan Note (Signed)
Controlled, no change in medication  

## 2019-07-08 ENCOUNTER — Ambulatory Visit (HOSPITAL_COMMUNITY): Payer: BC Managed Care – PPO

## 2019-07-11 NOTE — Telephone Encounter (Signed)
Pt is scheduled 07-15-19 at 2:00 via phone

## 2019-07-15 ENCOUNTER — Other Ambulatory Visit: Payer: Self-pay

## 2019-07-15 ENCOUNTER — Ambulatory Visit (INDEPENDENT_AMBULATORY_CARE_PROVIDER_SITE_OTHER): Payer: BC Managed Care – PPO | Admitting: Family Medicine

## 2019-07-15 VITALS — BP 130/80 | Ht 64.0 in | Wt 305.0 lb

## 2019-07-15 DIAGNOSIS — R0602 Shortness of breath: Secondary | ICD-10-CM

## 2019-07-15 DIAGNOSIS — I10 Essential (primary) hypertension: Secondary | ICD-10-CM | POA: Diagnosis not present

## 2019-07-15 DIAGNOSIS — R05 Cough: Secondary | ICD-10-CM | POA: Diagnosis not present

## 2019-07-15 DIAGNOSIS — R059 Cough, unspecified: Secondary | ICD-10-CM

## 2019-07-15 DIAGNOSIS — Z6841 Body Mass Index (BMI) 40.0 and over, adult: Secondary | ICD-10-CM

## 2019-07-15 DIAGNOSIS — J45991 Cough variant asthma: Secondary | ICD-10-CM

## 2019-07-15 DIAGNOSIS — U071 COVID-19: Secondary | ICD-10-CM | POA: Diagnosis not present

## 2019-07-15 NOTE — Patient Instructions (Addendum)
F/U in March as before, call if you need me sooner  Please reschedule her mammogram appointment and provide her with the info, missed recent one due to Covid  Please get CXr this week, 07/19/2019 since you c/o c shortness of breath   Please get fasting labs for March appointment, 1 week before  Thankful that you are much improved  It is important that you exercise regularly at least 30 minutes 5 times a week. If you develop chest pain, have severe difficulty breathing, or feel very tired, stop exercising immediately and seek medical attention  Think about what you will eat, plan ahead. Choose " clean, green, fresh or frozen" over canned, processed or packaged foods which are more sugary, salty and fatty. 70 to 75% of food eaten should be vegetables and fruit. Three meals at set times with snacks allowed between meals, but they must be fruit or vegetables. Aim to eat over a 12 hour period , example 7 am to 7 pm, and STOP after  your last meal of the day. Drink water,generally about 64 ounces per day, no other drink is as healthy. Fruit juice is best enjoyed in a healthy way, by EATING the fruit.  Thanks for choosing [facility], we consider it a privelige to serve you.

## 2019-07-15 NOTE — Assessment & Plan Note (Signed)
Reports worse since recent covid infection, rept cxr , still has cough

## 2019-07-15 NOTE — Progress Notes (Signed)
Virtual Visit via Telephone Note  I connected with Brandy Dean on 07/15/19 at  2:00 PM EST by telephone and verified that I am speaking with the correct person using two identifiers.  Location: Patient: home Provider: office   I discussed the limitations, risks, security and privacy concerns of performing an evaluation and management service by telephone and the availability of in person appointments. I also discussed with the patient that there may be a patient responsible charge related to this service. The patient expressed understanding and agreed to proceed.   History of Present Illness: F/U chronic problems, medication review, and refill medication when necessary. Review most recent labs and order labs which are due Review preventive health and update with necessary referrals or immunizations as indicated Diagnosed with covid 19 on 01/22/2021and is currently quarantined Denies recent fever or chills. Still has a productive cough, sputum is clear. Denies body aches, loss of taste or smell , does have increased wheezing and shortness of breath Observations/Objective: BP 130/80   Ht 5\' 4"  (1.626 m)   Wt (!) 305 lb (138.3 kg)   BMI 52.35 kg/m  Good communication with no confusion and intact memory. Alert and oriented x 3 Sporadic  coughing during speech    Assessment and Plan: Shortness of breath on exertion Reports worse since recent covid infection, rept cxr , still has cough  COVID-19 C/o increased shortness of breath, and cough, otherwise recovering well Pt to complete quarantine and obtain updated cXR  Essential hypertension Controlled, no change in medication   Asthma, cough variant Mild flare due to covid infection  Morbid obesity with BMI of 50.0-59.9, adult  Patient re-educated about  the importance of commitment to a  minimum of 150 minutes of exercise per week as able.  The importance of healthy food choices with portion control discussed, as well as  eating regularly and within a 12 hour window most days. The need to choose "clean , green" food 50 to 75% of the time is discussed, as well as to make water the primary drink and set a goal of 64 ounces water daily.    Weight /BMI 07/15/2019 07/04/2019 03/27/2019  WEIGHT 305 lb 306 lb 306 lb 0.6 oz  HEIGHT 5\' 4"  5\' 4"  5\' 4"   BMI 52.35 kg/m2 52.52 kg/m2 52.53 kg/m2        Follow Up Instructions:    I discussed the assessment and treatment plan with the patient. The patient was provided an opportunity to ask questions and all were answered. The patient agreed with the plan and demonstrated an understanding of the instructions.   The patient was advised to call back or seek an in-person evaluation if the symptoms worsen or if the condition fails to improve as anticipated.  I provided 20   minutes of non-face-to-face time during this encounter.   03/29/2019, MD

## 2019-07-19 ENCOUNTER — Ambulatory Visit (HOSPITAL_COMMUNITY)
Admission: RE | Admit: 2019-07-19 | Discharge: 2019-07-19 | Disposition: A | Discharge status: Home / Self Care | Payer: BC Managed Care – PPO | Source: Ambulatory Visit | Attending: Family Medicine | Admitting: Family Medicine

## 2019-07-19 ENCOUNTER — Other Ambulatory Visit: Payer: Self-pay

## 2019-07-19 DIAGNOSIS — R0602 Shortness of breath: Secondary | ICD-10-CM

## 2019-07-19 DIAGNOSIS — R05 Cough: Secondary | ICD-10-CM

## 2019-07-19 DIAGNOSIS — R059 Cough, unspecified: Secondary | ICD-10-CM

## 2019-07-19 DIAGNOSIS — Z1231 Encounter for screening mammogram for malignant neoplasm of breast: Secondary | ICD-10-CM

## 2019-07-22 ENCOUNTER — Encounter: Payer: Self-pay | Admitting: Family Medicine

## 2019-07-22 NOTE — Assessment & Plan Note (Signed)
C/o increased shortness of breath, and cough, otherwise recovering well Pt to complete quarantine and obtain updated cXR

## 2019-07-22 NOTE — Assessment & Plan Note (Signed)
  Patient re-educated about  the importance of commitment to a  minimum of 150 minutes of exercise per week as able.  The importance of healthy food choices with portion control discussed, as well as eating regularly and within a 12 hour window most days. The need to choose "clean , green" food 50 to 75% of the time is discussed, as well as to make water the primary drink and set a goal of 64 ounces water daily.    Weight /BMI 07/15/2019 07/04/2019 03/27/2019  WEIGHT 305 lb 306 lb 306 lb 0.6 oz  HEIGHT 5\' 4"  5\' 4"  5\' 4"   BMI 52.35 kg/m2 52.52 kg/m2 52.53 kg/m2

## 2019-07-22 NOTE — Assessment & Plan Note (Signed)
Controlled, no change in medication  

## 2019-07-22 NOTE — Assessment & Plan Note (Signed)
Mild flare due to covid infection

## 2019-08-29 ENCOUNTER — Ambulatory Visit: Payer: BC Managed Care – PPO | Admitting: Family Medicine

## 2019-09-25 ENCOUNTER — Other Ambulatory Visit: Payer: Self-pay | Admitting: *Deleted

## 2019-09-25 DIAGNOSIS — E559 Vitamin D deficiency, unspecified: Secondary | ICD-10-CM

## 2019-09-25 MED ORDER — ERGOCALCIFEROL 1.25 MG (50000 UT) PO CAPS: 50000.0000 [IU] | ORAL_CAPSULE | ORAL | 1 refills | Status: DC

## 2019-09-30 ENCOUNTER — Encounter (HOSPITAL_COMMUNITY): Payer: Self-pay | Admitting: Emergency Medicine

## 2019-09-30 ENCOUNTER — Emergency Department (HOSPITAL_COMMUNITY)
Admission: EM | Admit: 2019-09-30 | Discharge: 2019-09-30 | Disposition: A | Discharge status: Home / Self Care | Payer: BC Managed Care – PPO | Attending: Emergency Medicine | Admitting: Emergency Medicine

## 2019-09-30 ENCOUNTER — Other Ambulatory Visit: Payer: Self-pay

## 2019-09-30 ENCOUNTER — Emergency Department (HOSPITAL_COMMUNITY): Payer: BC Managed Care – PPO

## 2019-09-30 DIAGNOSIS — S20221A Contusion of right back wall of thorax, initial encounter: Secondary | ICD-10-CM | POA: Diagnosis not present

## 2019-09-30 DIAGNOSIS — Z79899 Other long term (current) drug therapy: Secondary | ICD-10-CM | POA: Insufficient documentation

## 2019-09-30 DIAGNOSIS — S299XXA Unspecified injury of thorax, initial encounter: Secondary | ICD-10-CM | POA: Diagnosis present

## 2019-09-30 DIAGNOSIS — R0602 Shortness of breath: Secondary | ICD-10-CM | POA: Insufficient documentation

## 2019-09-30 DIAGNOSIS — Z8616 Personal history of COVID-19: Secondary | ICD-10-CM | POA: Insufficient documentation

## 2019-09-30 DIAGNOSIS — W010XXA Fall on same level from slipping, tripping and stumbling without subsequent striking against object, initial encounter: Secondary | ICD-10-CM | POA: Diagnosis not present

## 2019-09-30 DIAGNOSIS — I10 Essential (primary) hypertension: Secondary | ICD-10-CM | POA: Diagnosis not present

## 2019-09-30 DIAGNOSIS — Y9389 Activity, other specified: Secondary | ICD-10-CM | POA: Insufficient documentation

## 2019-09-30 DIAGNOSIS — S40011A Contusion of right shoulder, initial encounter: Secondary | ICD-10-CM

## 2019-09-30 DIAGNOSIS — Y929 Unspecified place or not applicable: Secondary | ICD-10-CM | POA: Insufficient documentation

## 2019-09-30 DIAGNOSIS — Y999 Unspecified external cause status: Secondary | ICD-10-CM | POA: Diagnosis not present

## 2019-09-30 MED ORDER — TRAMADOL HCL 50 MG PO TABS: 50.0000 mg | ORAL_TABLET | Freq: Four times a day (QID) | ORAL | 0 refills | Status: DC | PRN

## 2019-09-30 NOTE — ED Triage Notes (Signed)
Pt states she fell last Friday, injuring her right shoulder. Pt has limited movement.  Also c/o of increased sob with asthma.

## 2019-09-30 NOTE — Discharge Instructions (Addendum)
Your x-rays today are negative for any bony injury including fractures of your scapula or ribs.  Your lungs are also clear.  You have been prescribed a course of prednisone which is a strong anti-inflammatory which can help with musculoskeletal pain but also can help improve your breathing.  I recommend using your albuterol inhaler if needed for return of wheezing, in addition continue taking your Symbicort as prescribed.

## 2019-09-30 NOTE — ED Provider Notes (Signed)
[facility] EMERGENCY DEPARTMENT Provider Note   CSN: [id number] Arrival date & time: 09/30/19  1559     History Chief Complaint  Patient presents with  . Fall    Brandy Dean is a 69 y.o. female presenting with persistent pain at her right scapula and along her upper back since slipping and falling, landing directly on hard floor with her upper back/scapular region 10 days ago.  She has used multiple treatments including Salon Pas, muscle rub, aleve and tylenol without improvement in pain.  She denies shoulder pain.  She also does report sob, but not any new or worsened, instead persistent since her diagnosis with covid 19 two months ago.  She denies any further fever, cough, wheezing or chest pain, but endorses she is still having exercise intolerance, stating walking in here from the parking lot caused mild sob.  She is taking symbicort bid. She has her albuterol for prn use but has not felt she needed it recently.  She denies orthopnea or peripheral edema.   The history is provided by the patient.       Past Medical History:  Diagnosis Date  . Bacterial pneumonia    March  9-12,  2011  . Bronchial asthma   . Essential hypertension, benign   . Morbid obesity (HCC)   . Sleep apnea     Patient Active Problem List   Diagnosis Date Noted  . COVID-19 07/07/2019  . Shoulder pain 06/26/2017  . Dyspepsia 04/23/2017  . Cholelithiasis 07/19/2014  . Nodule of left lung 06/12/2013  . Prediabetes 11/07/2011  . Heart murmur 05/11/2011  . Hyperlipidemia with target LDL less than 100 04/24/2011  . Shortness of breath on exertion 04/20/2011  . Morbid obesity with BMI of 50.0-59.9, adult (HCC) 09/27/2007  . Essential hypertension 09/27/2007  . Asthma, cough variant 09/27/2007    Past Surgical History:  Procedure Laterality Date  . CESAREAN SECTION  1988 & 1991  . Partial left lobectomy  1968  . RIght and left keloid ears  1968     OB History   No obstetric history on  file.     Family History  Problem Relation Age of Onset  . Stroke Mother   . Hypertension Mother   . Esophageal cancer Father   . Obesity Sister     Social History   Tobacco Use  . Smoking status: Former Smoker    Types: Cigarettes    Quit date: 06/14/2007    Years since quitting: 12.3  . Smokeless tobacco: Never Used  Substance Use Topics  . Alcohol use: No  . Drug use: No    Home Medications Prior to Admission medications   Medication Sig Start Date End Date Taking? Authorizing Provider  albuterol (ACCUNEB) 1.25 MG/3ML nebulizer solution Take 3 mLs (1.25 mg total) by nebulization every 6 (six) hours as needed for wheezing. 02/20/19   Freddy Finner, NP  albuterol (VENTOLIN HFA) 108 (90 Base) MCG/ACT inhaler Inhale 2 puffs into the lungs every 6 (six) hours as needed for wheezing or shortness of breath. 06/20/19   Kerri Perches, MD  atorvastatin (LIPITOR) 20 MG tablet Take 1 tablet (20 mg total) by mouth daily. 02/20/19   Freddy Finner, NP  chlorpheniramine-HYDROcodone Ste[facility] PENNKINETIC ER) 10-8 MG/5ML SUER Take one teaspoon by mouth two times daily for excess cough 02/20/19   Kerri Perches, MD  chlorpheniramine-HYDROcodone [facility] PENNKINETIC ER) 10-8 MG/5ML SUER Take one teaspoon at bedtime  As needed for cough  07/04/19   Kerri Perches, MD  clotrimazole-betamethasone (LOTRISONE) cream APPLY EXTERNALLY TO THE AFFECTED AREA TWICE DAILY 12/30/18   Freddy Finner, NP  diphenoxylate-atropine (LOMOTIL) 2.5-0.025 MG tablet Take 1 tablet by mouth 4 (four) times daily as needed for diarrhea or loose stools. 07/04/19   Kerri Perches, MD  ergocalciferol (VITAMIN D2) 1.25 MG (50000 UT) capsule Take 1 capsule (50,000 Units total) by mouth once a week. One capsule once weekly 09/25/19   Freddy Finner, NP  montelukast (SINGULAIR) 10 MG tablet Take 1 tablet (10 mg total) by mouth at bedtime. 02/20/19   Freddy Finner, NP  SYMBICORT 160-4.5 MCG/ACT inhaler Inhale 2 puffs  into the lungs 2 (two) times daily. 08/03/18   Kerri Perches, MD  traMADol (ULTRAM) 50 MG tablet Take 1 tablet (50 mg total) by mouth every 6 (six) hours as needed. 09/30/19   Burgess Amor, PA-C  triamterene-hydrochlorothiazide (MAXZIDE) 75-50 MG tablet Take 1 tablet by mouth daily. 02/20/19   Freddy Finner, NP  UNABLE TO FIND Nebulizer machine (with 2 tubings and mouthpiece) Dx asthma J45.991 02/13/18   Kerri Perches, MD  UNABLE TO FIND Please provide with BP cuff that insurance will cover. 02/20/19   Freddy Finner, NP  UNABLE TO FIND Nebulizer machine x 1  Dx X91.478 03/19/19   Kerri Perches, MD    Allergies    Benzonatate  Review of Systems   Review of Systems  Constitutional: Negative for chills and fever.  HENT: Negative for congestion and sore throat.   Eyes: Negative.   Respiratory: Positive for shortness of breath. Negative for cough, chest tightness and wheezing.   Cardiovascular: Negative for chest pain.  Gastrointestinal: Negative for abdominal pain, nausea and vomiting.  Genitourinary: Negative.   Musculoskeletal: Positive for arthralgias. Negative for joint swelling and neck pain.  Skin: Negative.  Negative for rash and wound.  Neurological: Negative for dizziness, weakness, light-headedness, numbness and headaches.  Psychiatric/Behavioral: Negative.     Physical Exam Updated Vital Signs BP 136/82   Pulse 64   Temp 98.2 F (36.8 C) (Oral)   Resp 18   Ht 5\' 4"  (1.626 m)   Wt (!) 138.3 kg   SpO2 100%   BMI 52.35 kg/m   Physical Exam Vitals and nursing note reviewed.  Constitutional:      Appearance: She is well-developed.  HENT:     Head: Normocephalic and atraumatic.  Eyes:     Conjunctiva/sclera: Conjunctivae normal.  Cardiovascular:     Rate and Rhythm: Normal rate and regular rhythm.     Heart sounds: Normal heart sounds.  Pulmonary:     Effort: Pulmonary effort is normal.     Breath sounds: Normal breath sounds. No decreased breath  sounds, wheezing, rhonchi or rales.     Comments: Normal lung exam.  Musculoskeletal:        General: Normal range of motion.     Right shoulder: No swelling, deformity, bony tenderness or crepitus.       Arms:     Cervical back: Normal range of motion.     Comments: ttp right scapula.  No edema or palpable deformity.   Skin:    General: Skin is warm and dry.  Neurological:     Mental Status: She is alert.     ED Results / Procedures / Treatments   Labs (all labs ordered are listed, but only abnormal results are displayed) Labs Reviewed - No data to  display  EKG None  Radiology DG Ribs Unilateral W/Chest Right  Result Date: 09/30/2019 CLINICAL DATA:  Fall, back pain EXAM: RIGHT RIBS AND CHEST - 3+ VIEW COMPARISON:  Chest radiograph dated 07/19/2019 FINDINGS: Lungs are clear.  No pleural effusion or pneumothorax. Mild cardiomegaly.  Thoracic aortic atherosclerosis. No displaced right rib fracture is seen. Moderate degenerative changes of the right shoulder. IMPRESSION: No evidence of acute cardiopulmonary disease. No displaced right rib fracture is seen. Electronically Signed   By: Julian Hy M.D.   On: 09/30/2019 18:09   DG Scapula Right  Result Date: 09/30/2019 CLINICAL DATA:  Fall, upper back pain EXAM: RIGHT SCAPULA - 2+ VIEWS COMPARISON:  None. FINDINGS: No fracture or dislocation is seen. Moderate degenerative changes involving the right shoulder with fragmentation. Visualized right lung is clear. IMPRESSION: Negative. Electronically Signed   By: Julian Hy M.D.   On: 09/30/2019 18:08    Procedures Procedures (including critical care time)  Medications Ordered in ED Medications - No data to display  ED Course  I have reviewed the triage vital signs and the nursing notes.  Pertinent labs & imaging results that were available during my care of the patient were reviewed by me and considered in my medical decision making (see chart for details).    MDM  Rules/Calculators/A&P                      Patient with suspected contusion of the right scapular region.  Her x-rays were reviewed and discussed with her.  I also personally reviewed the x-rays.  She has no rib fractures, her scapula is intact.  I suspect that this is muscular strain/contusion.  She was encouraged to continue with her home treatments and to maintain activity as tolerated.  I have given her small quantity of tramadol after review of the [facility] narcotic database.  She was also referred to orthopedics for follow-up care if her symptoms persist or not improved with today's treatment plan.  She has no respiratory distress with normal vital signs during today's visit.  She has not been using her albuterol MDI, but has been using her Symbicort.  She was encouraged to use her albuterol if she feels she needs any additional assistance.  Her lungs are clear and she has no abnormal findings on her imaging today.   Final Clinical Impression(s) / ED Diagnoses Final diagnoses:  Contusion of right scapular region, initial encounter    Rx / DC Orders ED Discharge Orders         Ordered    traMADol (ULTRAM) 50 MG tablet  Every 6 hours PRN     09/30/19 1952           Landis Martins 09/30/19 2008    Noemi Chapel, MD 10/01/19 2113

## 2019-10-01 ENCOUNTER — Telehealth: Payer: Self-pay | Admitting: Family Medicine

## 2019-10-01 DIAGNOSIS — I10 Essential (primary) hypertension: Secondary | ICD-10-CM

## 2019-10-01 DIAGNOSIS — R7301 Impaired fasting glucose: Secondary | ICD-10-CM

## 2019-10-01 DIAGNOSIS — E785 Hyperlipidemia, unspecified: Secondary | ICD-10-CM

## 2019-10-01 NOTE — Telephone Encounter (Signed)
Seen in ED yesterday for fall. Needs fasting lipid, cmp and EGFr and hBA1C, dx HTN, hyperlipid and IGT, thanks

## 2019-10-01 NOTE — Addendum Note (Signed)
Addended by: Abner Greenspan on: 10/01/2019 02:39 PM   Modules accepted: Orders

## 2019-10-05 ENCOUNTER — Other Ambulatory Visit: Payer: Self-pay | Admitting: Family Medicine

## 2019-10-05 DIAGNOSIS — I10 Essential (primary) hypertension: Secondary | ICD-10-CM

## 2019-10-07 ENCOUNTER — Telehealth: Payer: Self-pay

## 2019-10-07 NOTE — Telephone Encounter (Signed)
Pt is calling to get her BP pills called in to [facility]  [address] days supply

## 2019-10-08 NOTE — Telephone Encounter (Signed)
This has been sent

## 2019-11-20 ENCOUNTER — Other Ambulatory Visit: Payer: Self-pay

## 2019-11-20 ENCOUNTER — Ambulatory Visit (INDEPENDENT_AMBULATORY_CARE_PROVIDER_SITE_OTHER): Payer: BC Managed Care – PPO | Admitting: Family Medicine

## 2019-11-20 ENCOUNTER — Encounter: Payer: Self-pay | Admitting: Family Medicine

## 2019-11-20 VITALS — BP 130/82 | HR 67 | Temp 98.6°F | Resp 15 | Ht 64.0 in | Wt 290.1 lb

## 2019-11-20 DIAGNOSIS — R7303 Prediabetes: Secondary | ICD-10-CM

## 2019-11-20 DIAGNOSIS — E785 Hyperlipidemia, unspecified: Secondary | ICD-10-CM

## 2019-11-20 DIAGNOSIS — E559 Vitamin D deficiency, unspecified: Secondary | ICD-10-CM

## 2019-11-20 DIAGNOSIS — B369 Superficial mycosis, unspecified: Secondary | ICD-10-CM | POA: Diagnosis not present

## 2019-11-20 DIAGNOSIS — J45991 Cough variant asthma: Secondary | ICD-10-CM

## 2019-11-20 DIAGNOSIS — I10 Essential (primary) hypertension: Secondary | ICD-10-CM

## 2019-11-20 DIAGNOSIS — Z6841 Body Mass Index (BMI) 40.0 and over, adult: Secondary | ICD-10-CM

## 2019-11-20 DIAGNOSIS — M25511 Pain in right shoulder: Secondary | ICD-10-CM

## 2019-11-20 DIAGNOSIS — R7301 Impaired fasting glucose: Secondary | ICD-10-CM

## 2019-11-20 MED ORDER — CLOTRIMAZOLE-BETAMETHASONE 1-0.05 % EX CREA: TOPICAL_CREAM | CUTANEOUS | 1 refills | Status: DC

## 2019-11-20 MED ORDER — ALBUTEROL SULFATE 1.25 MG/3ML IN NEBU: 1.0000 | INHALATION_SOLUTION | Freq: Four times a day (QID) | RESPIRATORY_TRACT | 5 refills | Status: DC | PRN

## 2019-11-20 MED ORDER — SYMBICORT 160-4.5 MCG/ACT IN AERO: 2.0000 | INHALATION_SPRAY | Freq: Two times a day (BID) | RESPIRATORY_TRACT | 12 refills | Status: DC

## 2019-11-20 MED ORDER — TRIAMTERENE-HCTZ 75-50 MG PO TABS: 1.0000 | ORAL_TABLET | Freq: Every day | ORAL | 5 refills | Status: DC

## 2019-11-20 MED ORDER — ALBUTEROL SULFATE HFA 108 (90 BASE) MCG/ACT IN AERS: 2.0000 | INHALATION_SPRAY | Freq: Four times a day (QID) | RESPIRATORY_TRACT | 1 refills | Status: DC | PRN

## 2019-11-20 NOTE — Patient Instructions (Addendum)
Annual physical exam end October, call if you need me sooner  Please get covid vaccine  Labs today ( non fast) lipid, cmp and eGFr , vit D and hBA1C  CONGRATS on weight loss, keep it up!! It is important that you exercise regularly at least 30 minutes 5 times a week. If you develop chest pain, have severe difficulty breathing, or feel very tired, stop exercising immediately and seek medical attention   Think about what you will eat, plan ahead. Choose " clean, green, fresh or frozen" over canned, processed or packaged foods which are more sugary, salty and fatty. 70 to 75% of food eaten should be vegetables and fruit. Three meals at set times with snacks allowed between meals, but they must be fruit or vegetables. Aim to eat over a 12 hour period , example 7 am to 7 pm, and STOP after  your last meal of the day. Drink water,generally about 64 ounces per day, no other drink is as healthy. Fruit juice is best enjoyed in a healthy way, by EATING the fruit. Thanks for choosing [facility], we consider it a privelige to serve you.

## 2019-11-20 NOTE — Progress Notes (Signed)
Brandy Dean     MRN: [id number]      DOB: 1951-02-18   HPI Brandy Dean is here for follow up and re-evaluation of chronic medical conditions, medication management and review of any available recent lab and radiology data.  Preventive health is updated, specifically  Cancer screening and Immunization.   Questions or concerns regarding consultations or procedures which the PT has had in the interim are  addressed. The PT denies any adverse reactions to current medications since the last visit.  Larey Seat and injured right posterior  shoulder on the job several weeks ago, still has some discomfort but full rOM  ROS Denies recent fever or chills. Denies sinus pressure, nasal congestion, ear pain or sore throat. Denies chest congestion, productive cough or wheezing. Denies chest pains, palpitations and leg swelling Denies abdominal pain, nausea, vomiting,diarrhea or constipation.   Denies dysuria, frequency, hesitancy or incontinence. Denies, swelling and limitation in mobility. Denies headaches, seizures, numbness, or tingling. Denies depression, anxiety or insomnia. Denies skin break down or rash.   PE  BP 130/82   Pulse 67   Temp 98.6 F (37 C) (Temporal)   Resp 15   Ht 5\' 4"  (1.626 m)   Wt 290 lb 1.9 oz (131.6 kg)   SpO2 94%   BMI 49.80 kg/m   Patient alert and oriented and in no cardiopulmonary distress.  HEENT: No facial asymmetry, EOMI,     Neck supple .  Chest: Clear to auscultation bilaterally.  CVS: S1, S2 no murmurs, no S3.Regular rate.  ABD: Soft non tender.   Ext: No edema  MS: Adequate ROM spine, shoulders, hips and knees.  Skin: Intact, no ulcerations or rash noted.  Psych: Good eye contact, normal affect. Memory intact not anxious or depressed appearing.  CNS: CN 2-12 intact, power,  normal throughout.no focal deficits noted.   Assessment & Plan  Essential hypertension Controlled, no change in medication DASH diet and commitment to daily physical  activity for a minimum of 30 minutes discussed and encouraged, as a part of hypertension management. The importance of attaining a healthy weight is also discussed.  BP/Weight 11/20/2019 09/30/2019 07/15/2019 07/04/2019 03/27/2019 03/25/2019 03/18/2019  Systolic BP 130 136 130 130 130 157 118  Diastolic BP 82 82 80 80 80 78 82  Wt. (Lbs) 290.12 305 305 306 306.04 307 311  BMI 49.8 52.35 52.35 52.52 52.53 52.7 53.38       Morbid obesity with BMI of 50.0-59.9, adult  Patient re-educated about  the importance of commitment to a  minimum of 150 minutes of exercise per week as able.  The importance of healthy food choices with portion control discussed, as well as eating regularly and within a 12 hour window most days. The need to choose "clean , green" food 50 to 75% of the time is discussed, as well as to make water the primary drink and set a goal of 64 ounces water daily.    Weight /BMI 11/20/2019 09/30/2019 07/15/2019  WEIGHT 290 lb 1.9 oz 305 lb 305 lb  HEIGHT 5\' 4"  5\' 4"  5\' 4"   BMI 49.8 kg/m2 52.35 kg/m2 52.35 kg/m2      Shoulder pain Right shoulder discomfort following fall at work , gradually improving , reports negative X ray and normal ROM  Prediabetes Patient educated about the importance of limiting  Carbohydrate intake , the need to commit to daily physical activity for a minimum of 30 minutes , and to commit weight loss. The fact  that changes in all these areas will reduce or eliminate all together the development of diabetes is stressed.  Excellent, now has normal blood sugar  Diabetic Labs Latest Ref Rng & Units 11/20/2019 03/21/2019 12/07/2017 04/20/2017 05/30/2016  HbA1c <5.7 % of total Hgb 5.4 - 5.7(H) 5.7(H) 5.7(H)  Chol <200 mg/dL 248(H) 233(H) 236(H) 231(H) 208(H)  HDL > OR = 50 mg/dL 84 72 47(L) 76 69  Calc LDL mg/dL (calc) 147(H) 142(H) 167(H) 138(H) 127(H)  Triglycerides <150 mg/dL 72 83 103 76 62  Creatinine 0.50 - 0.99 mg/dL 0.81 0.77 0.95 0.71 0.78   BP/Weight  11/20/2019 09/30/2019 07/15/2019 07/04/2019 03/27/2019 03/25/2019 25/0/5397  Systolic BP 673 419 379 024 097 353 299  Diastolic BP 82 82 80 80 80 78 82  Wt. (Lbs) 290.12 305 305 306 306.04 307 311  BMI 49.8 52.35 52.35 52.52 52.53 52.7 53.38   No flowsheet data found.    Hyperlipidemia with target LDL less than 100 Hyperlipidemia:Low fat diet discussed and encouraged.   Lipid Panel  Lab Results  Component Value Date   CHOL 248 (H) 11/20/2019   HDL 84 11/20/2019   LDLCALC 147 (H) 11/20/2019   TRIG 72 11/20/2019   CHOLHDL 3.0 11/20/2019   Needs to reduce fried and fatty foods    Asthma, cough variant Controlled well with daily preventive medication  Vitamin D deficiency Corrected with medication, continue same

## 2019-11-20 NOTE — Progress Notes (Signed)
Brandy Dean     MRN: [id number]      DOB: April 23, 1951   HPI Brandy Dean is here for follow up and re-evaluation of chronic medical conditions, medication management and review of any available recent lab and radiology data.  Preventive health is updated, specifically  Cancer screening and Immunization.   Questions or concerns regarding consultations or procedures which the PT has had in the interim are  addressed. The PT denies any adverse reactions to current medications since the last visit.  There are no new concerns.  There are no specific complaints   ROS Denies recent fever or chills. Denies sinus pressure, nasal congestion, ear pain or sore throat. Denies chest congestion, productive cough or wheezing. Denies chest pains, palpitations and leg swelling Denies abdominal pain, nausea, vomiting,diarrhea or constipation.   Denies dysuria, frequency, hesitancy or incontinence. Denies joint pain, swelling and limitation in mobility. Denies headaches, seizures, numbness, or tingling. Denies depression, anxiety or insomnia. Denies skin break down or rash.   PE  BP (!) 143/84   Pulse 67   Temp 98.6 F (37 C) (Temporal)   Resp 15   Ht 5\' 4"  (1.626 m)   Wt 290 lb 1.9 oz (131.6 kg)   SpO2 94%   BMI 49.80 kg/m   Patient alert and oriented and in no cardiopulmonary distress.  HEENT: No facial asymmetry, EOMI,     Neck supple .  Chest: Clear to auscultation bilaterally.  CVS: S1, S2 no murmurs, no S3.Regular rate.  ABD: Soft non tender.   Ext: No edema  MS: Adequate ROM spine, shoulders, hips and knees.  Skin: Intact, no ulcerations or rash noted.  Psych: Good eye contact, normal affect. Memory intact not anxious or depressed appearing.  CNS: CN 2-12 intact, power,  normal throughout.no focal deficits noted.   Assessment & Plan  Essential hypertension Controlled, no change in medication DASH diet and commitment to daily physical activity for a minimum of 30  minutes discussed and encouraged, as a part of hypertension management. The importance of attaining a healthy weight is also discussed.  BP/Weight 11/20/2019 09/30/2019 07/15/2019 07/04/2019 03/27/2019 03/25/2019 03/18/2019  Systolic BP 130 136 130 130 130 157 118  Diastolic BP 82 82 80 80 80 78 82  Wt. (Lbs) 290.12 305 305 306 306.04 307 311  BMI 49.8 52.35 52.35 52.52 52.53 52.7 53.38       Morbid obesity with BMI of 50.0-59.9, adult  Patient re-educated about  the importance of commitment to a  minimum of 150 minutes of exercise per week as able.  The importance of healthy food choices with portion control discussed, as well as eating regularly and within a 12 hour window most days. The need to choose "clean , green" food 50 to 75% of the time is discussed, as well as to make water the primary drink and set a goal of 64 ounces water daily.    Weight /BMI 11/20/2019 09/30/2019 07/15/2019  WEIGHT 290 lb 1.9 oz 305 lb 305 lb  HEIGHT 5\' 4"  5\' 4"  5\' 4"   BMI 49.8 kg/m2 52.35 kg/m2 52.35 kg/m2      Shoulder pain Right shoulder discomfort following fall at work , gradually improving , reports negative X ray and normal ROM  Prediabetes Patient educated about the importance of limiting  Carbohydrate intake , the need to commit to daily physical activity for a minimum of 30 minutes , and to commit weight loss. The fact that changes in all these  areas will reduce or eliminate all together the development of diabetes is stressed.  Excellent, now has normal blood sugar  Diabetic Labs Latest Ref Rng & Units 11/20/2019 03/21/2019 12/07/2017 04/20/2017 05/30/2016  HbA1c <5.7 % of total Hgb 5.4 - 5.7(H) 5.7(H) 5.7(H)  Chol <200 mg/dL 248(H) 233(H) 236(H) 231(H) 208(H)  HDL > OR = 50 mg/dL 84 72 47(L) 76 69  Calc LDL mg/dL (calc) 147(H) 142(H) 167(H) 138(H) 127(H)  Triglycerides <150 mg/dL 72 83 103 76 62  Creatinine 0.50 - 0.99 mg/dL 0.81 0.77 0.95 0.71 0.78   BP/Weight 11/20/2019 09/30/2019 07/15/2019  07/04/2019 03/27/2019 03/25/2019 54/07/7060  Systolic BP 376 283 151 761 607 371 062  Diastolic BP 82 82 80 80 80 78 82  Wt. (Lbs) 290.12 305 305 306 306.04 307 311  BMI 49.8 52.35 52.35 52.52 52.53 52.7 53.38   No flowsheet data found.    Hyperlipidemia with target LDL less than 100 Hyperlipidemia:Low fat diet discussed and encouraged.   Lipid Panel  Lab Results  Component Value Date   CHOL 248 (H) 11/20/2019   HDL 84 11/20/2019   LDLCALC 147 (H) 11/20/2019   TRIG 72 11/20/2019   CHOLHDL 3.0 11/20/2019   Needs to reduce fried and fatty foods    Asthma, cough variant Controlled well with daily preventive medication  Vitamin D deficiency Corrected with medication, continue same

## 2019-11-21 DIAGNOSIS — E559 Vitamin D deficiency, unspecified: Secondary | ICD-10-CM | POA: Insufficient documentation

## 2019-11-21 LAB — COMPLETE METABOLIC PANEL WITH GFR
AG Ratio: 1.5 (calc) (ref 1.0–2.5)
ALT: 12 U/L (ref 6–29)
AST: 15 U/L (ref 10–35)
Albumin: 4.5 g/dL (ref 3.6–5.1)
Alkaline phosphatase (APISO): 56 U/L (ref 37–153)
BUN: 15 mg/dL (ref 7–25)
CO2: 34 mmol/L — ABNORMAL HIGH (ref 20–32)
Calcium: 10.7 mg/dL — ABNORMAL HIGH (ref 8.6–10.4)
Chloride: 96 mmol/L — ABNORMAL LOW (ref 98–110)
Creat: 0.81 mg/dL (ref 0.50–0.99)
GFR, Est African American: 87 mL/min/{1.73_m2} (ref >=60)
GFR, Est Non African American: 75 mL/min/{1.73_m2} (ref >=60)
Globulin: 3 g/dL (calc) (ref 1.9–3.7)
Glucose, Bld: 90 mg/dL (ref 65–99)
Potassium: 4.4 mmol/L (ref 3.5–5.3)
Sodium: 138 mmol/L (ref 135–146)
Total Bilirubin: 0.5 mg/dL (ref 0.2–1.2)
Total Protein: 7.5 g/dL (ref 6.1–8.1)

## 2019-11-21 LAB — VITAMIN D 25 HYDROXY (VIT D DEFICIENCY, FRACTURES): Vit D, 25-Hydroxy: 31 ng/mL (ref 30–100)

## 2019-11-21 LAB — LIPID PANEL
Cholesterol: 248 mg/dL — ABNORMAL HIGH (ref <200)
HDL: 84 mg/dL (ref >=50)
LDL Cholesterol (Calc): 147 mg/dL (calc) — ABNORMAL HIGH
Non-HDL Cholesterol (Calc): 164 mg/dL (calc) — ABNORMAL HIGH (ref <130)
Total CHOL/HDL Ratio: 3 (calc) (ref <5.0)
Triglycerides: 72 mg/dL (ref <150)

## 2019-11-21 LAB — HEMOGLOBIN A1C
Hgb A1c MFr Bld: 5.4 % of total Hgb (ref <5.7)
Mean Plasma Glucose: 108 (calc)
eAG (mmol/L): 6 (calc)

## 2019-11-21 NOTE — Assessment & Plan Note (Signed)
Patient educated about the importance of limiting  Carbohydrate intake , the need to commit to daily physical activity for a minimum of 30 minutes , and to commit weight loss. The fact that changes in all these areas will reduce or eliminate all together the development of diabetes is stressed.  Excellent, now has normal blood sugar  Diabetic Labs Latest Ref Rng & Units 11/20/2019 03/21/2019 12/07/2017 04/20/2017 05/30/2016  HbA1c <5.7 % of total Hgb 5.4 - 5.7(H) 5.7(H) 5.7(H)  Chol <200 mg/dL 427(C) 623(J) 628(B) 151(V) 208(H)  HDL > OR = 50 mg/dL 84 72 61(Y) 76 69  Calc LDL mg/dL (calc) 073(X) 106(Y) 694(W) 138(H) 127(H)  Triglycerides <150 mg/dL 72 83 546 76 62  Creatinine 0.50 - 0.99 mg/dL 2.70 3.50 0.93 8.18 2.99   BP/Weight 11/20/2019 09/30/2019 07/15/2019 07/04/2019 03/27/2019 03/25/2019 03/18/2019  Systolic BP 130 136 130 130 130 157 118  Diastolic BP 82 82 80 80 80 78 82  Wt. (Lbs) 290.12 305 305 306 306.04 307 311  BMI 49.8 52.35 52.35 52.52 52.53 52.7 53.38   No flowsheet data found.

## 2019-11-21 NOTE — Assessment & Plan Note (Signed)
°  Patient re-educated about  the importance of commitment to a  minimum of 150 minutes of exercise per week as able.  The importance of healthy food choices with portion control discussed, as well as eating regularly and within a 12 hour window most days. The need to choose "clean , green" food 50 to 75% of the time is discussed, as well as to make water the primary drink and set a goal of 64 ounces water daily.    Weight /BMI 11/20/2019 09/30/2019 07/15/2019  WEIGHT 290 lb 1.9 oz 305 lb 305 lb  HEIGHT 5\' 4"  5\' 4"  5\' 4"   BMI 49.8 kg/m2 52.35 kg/m2 52.35 kg/m2

## 2019-11-21 NOTE — Assessment & Plan Note (Signed)
Right shoulder discomfort following fall at work , gradually improving , reports negative X ray and normal ROM

## 2019-11-21 NOTE — Assessment & Plan Note (Signed)
Controlled, no change in medication DASH diet and commitment to daily physical activity for a minimum of 30 minutes discussed and encouraged, as a part of hypertension management. The importance of attaining a healthy weight is also discussed.  BP/Weight 11/20/2019 09/30/2019 07/15/2019 07/04/2019 03/27/2019 03/25/2019 03/18/2019  Systolic BP 130 136 130 130 130 157 118  Diastolic BP 82 82 80 80 80 78 82  Wt. (Lbs) 290.12 305 305 306 306.04 307 311  BMI 49.8 52.35 52.35 52.52 52.53 52.7 53.38

## 2019-11-21 NOTE — Assessment & Plan Note (Signed)
Hyperlipidemia:Low fat diet discussed and encouraged.   Lipid Panel  Lab Results  Component Value Date   CHOL 248 (H) 11/20/2019   HDL 84 11/20/2019   LDLCALC 147 (H) 11/20/2019   TRIG 72 11/20/2019   CHOLHDL 3.0 11/20/2019   Needs to reduce fried and fatty foods

## 2019-11-21 NOTE — Assessment & Plan Note (Signed)
Controlled well with daily preventive medication

## 2019-11-21 NOTE — Assessment & Plan Note (Signed)
Corrected with medication,continue same 

## 2020-01-15 IMAGING — DX DG CHEST 2V
2 series · 2 of 2 positions shown · non-contrast
Comparison: 06/26/2017

CLINICAL DATA: Cough, shortness of breath

EXAM:
CHEST - 2 VIEW

[chest pa]
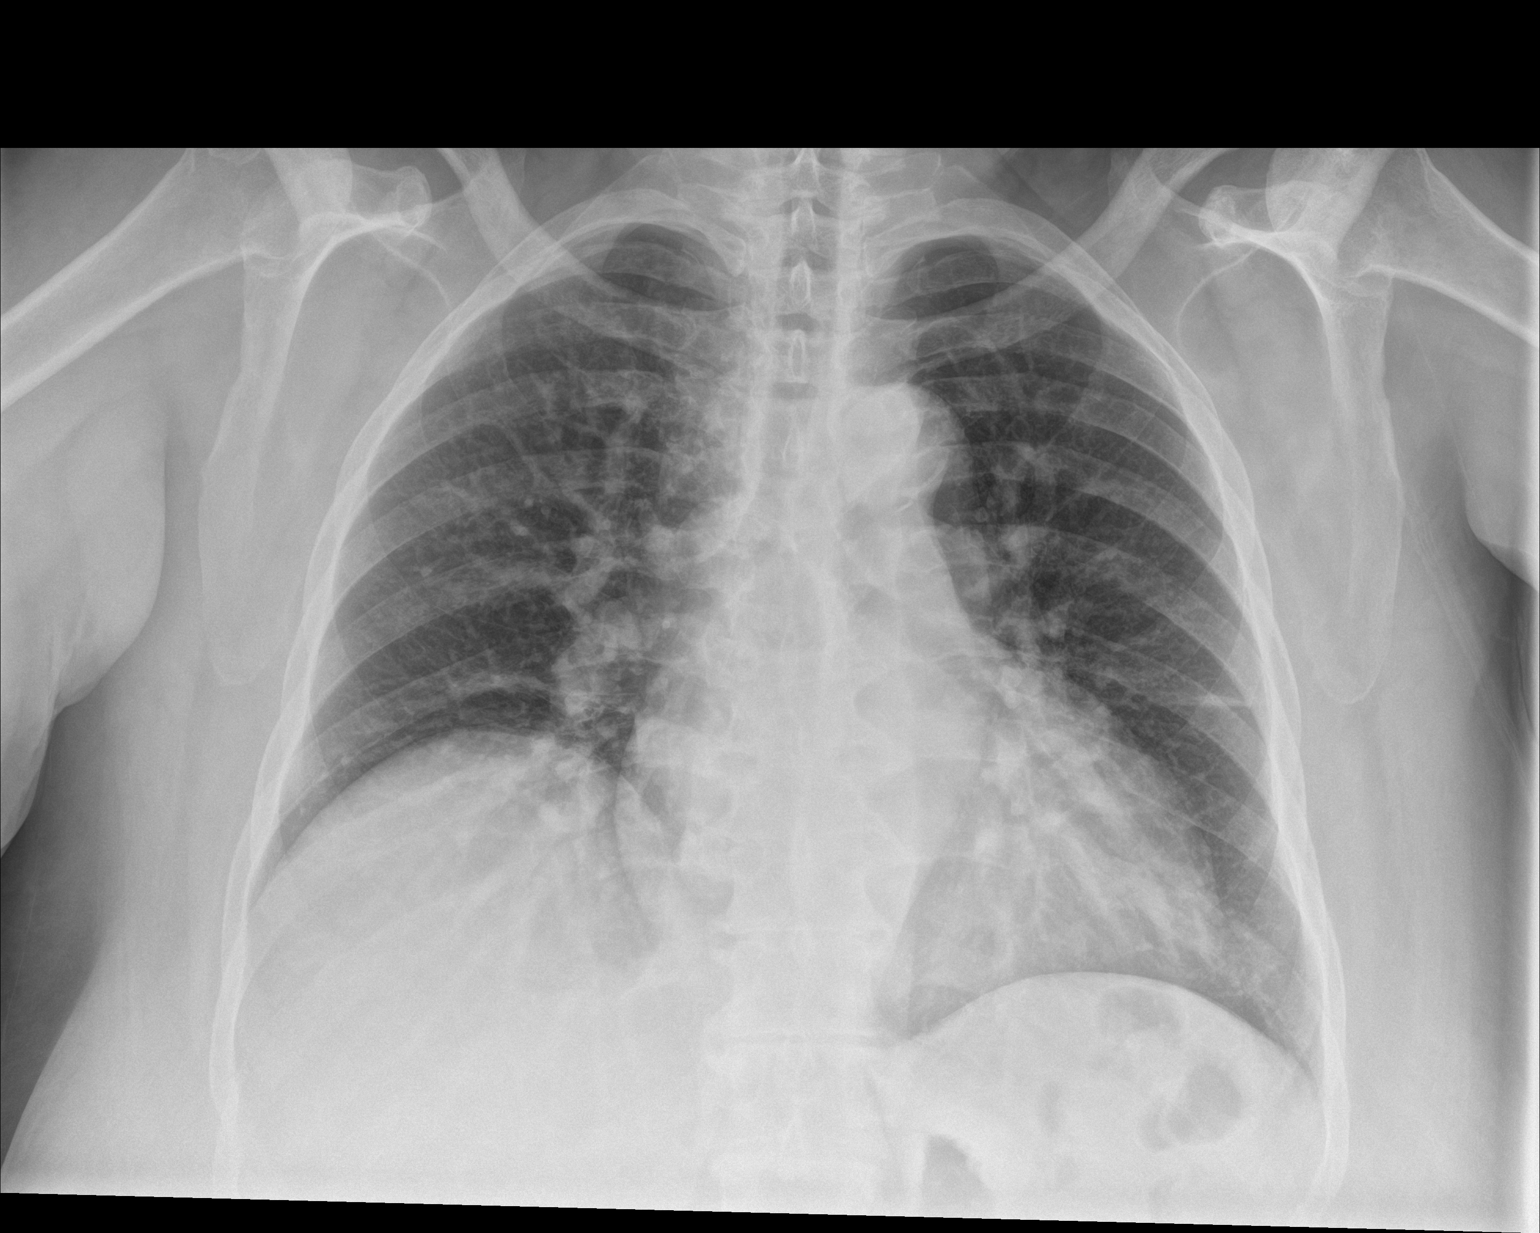

[chest lat]
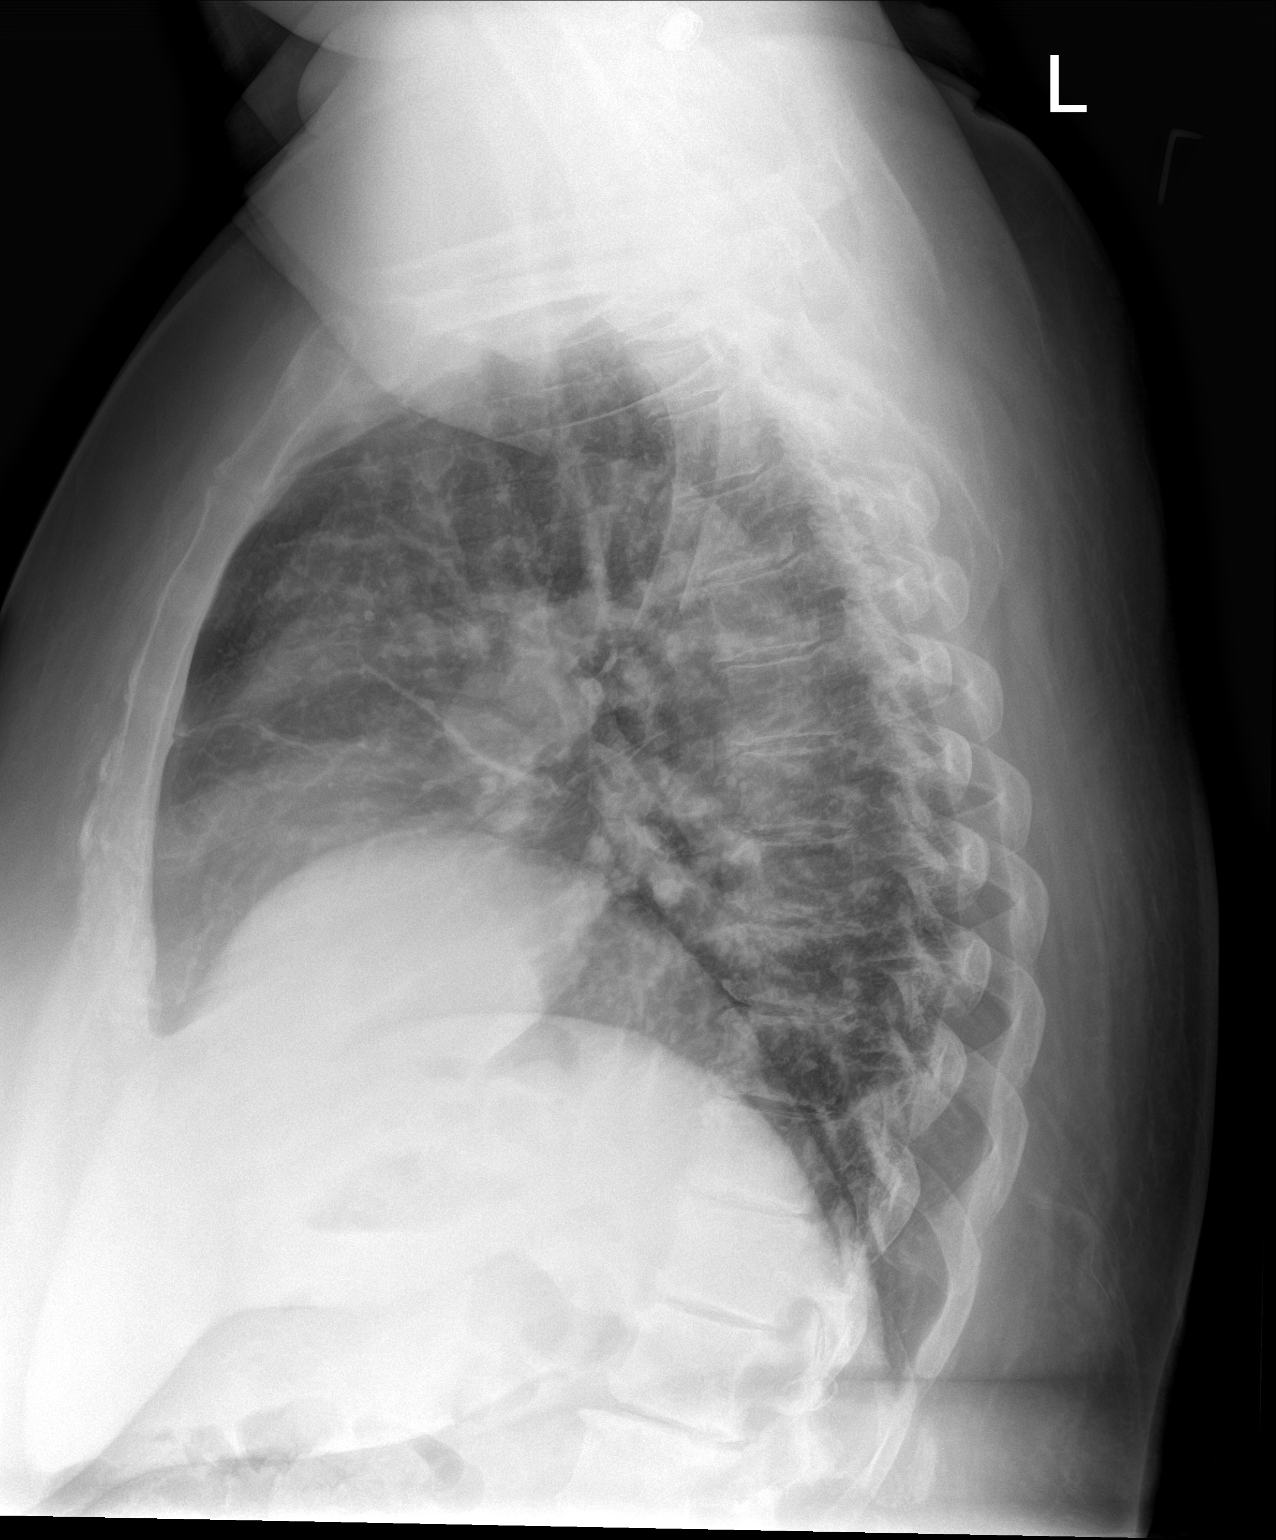

[2 of 2 positions shown; findings below may reference images not displayed]

FINDINGS: Elevation of the right hemidiaphragm. Linear scarring in the
lingula. Heart is mildly enlarged. No acute confluent opacities or
effusions. Degenerative changes in the shoulders. No acute bony
abnormality.
IMPRESSION: Mild elevation of the right hemidiaphragm.

Mild cardiomegaly.

No active disease.

## 2020-01-16 ENCOUNTER — Ambulatory Visit: Payer: BC Managed Care – PPO | Admitting: Family Medicine

## 2020-02-26 ENCOUNTER — Other Ambulatory Visit: Payer: Self-pay | Admitting: Family Medicine

## 2020-03-03 ENCOUNTER — Other Ambulatory Visit: Payer: Self-pay | Admitting: Family Medicine

## 2020-03-03 DIAGNOSIS — B369 Superficial mycosis, unspecified: Secondary | ICD-10-CM

## 2020-03-31 ENCOUNTER — Ambulatory Visit: Payer: BC Managed Care – PPO | Admitting: Family Medicine

## 2020-04-15 ENCOUNTER — Ambulatory Visit: Payer: BC Managed Care – PPO | Admitting: Family Medicine

## 2020-04-23 ENCOUNTER — Other Ambulatory Visit: Payer: Self-pay | Admitting: Family Medicine

## 2020-05-10 IMAGING — DX DG CHEST 2V
2 series · 2 of 2 positions shown · non-contrast
Comparison: PA and lateral chest 03/25/2019 06/26/2017.

CLINICAL DATA: Cough and shortness of breath. Patient diagnosed
with URVP4-SO in June 2019.

EXAM:
CHEST - 2 VIEW

[chest pa]
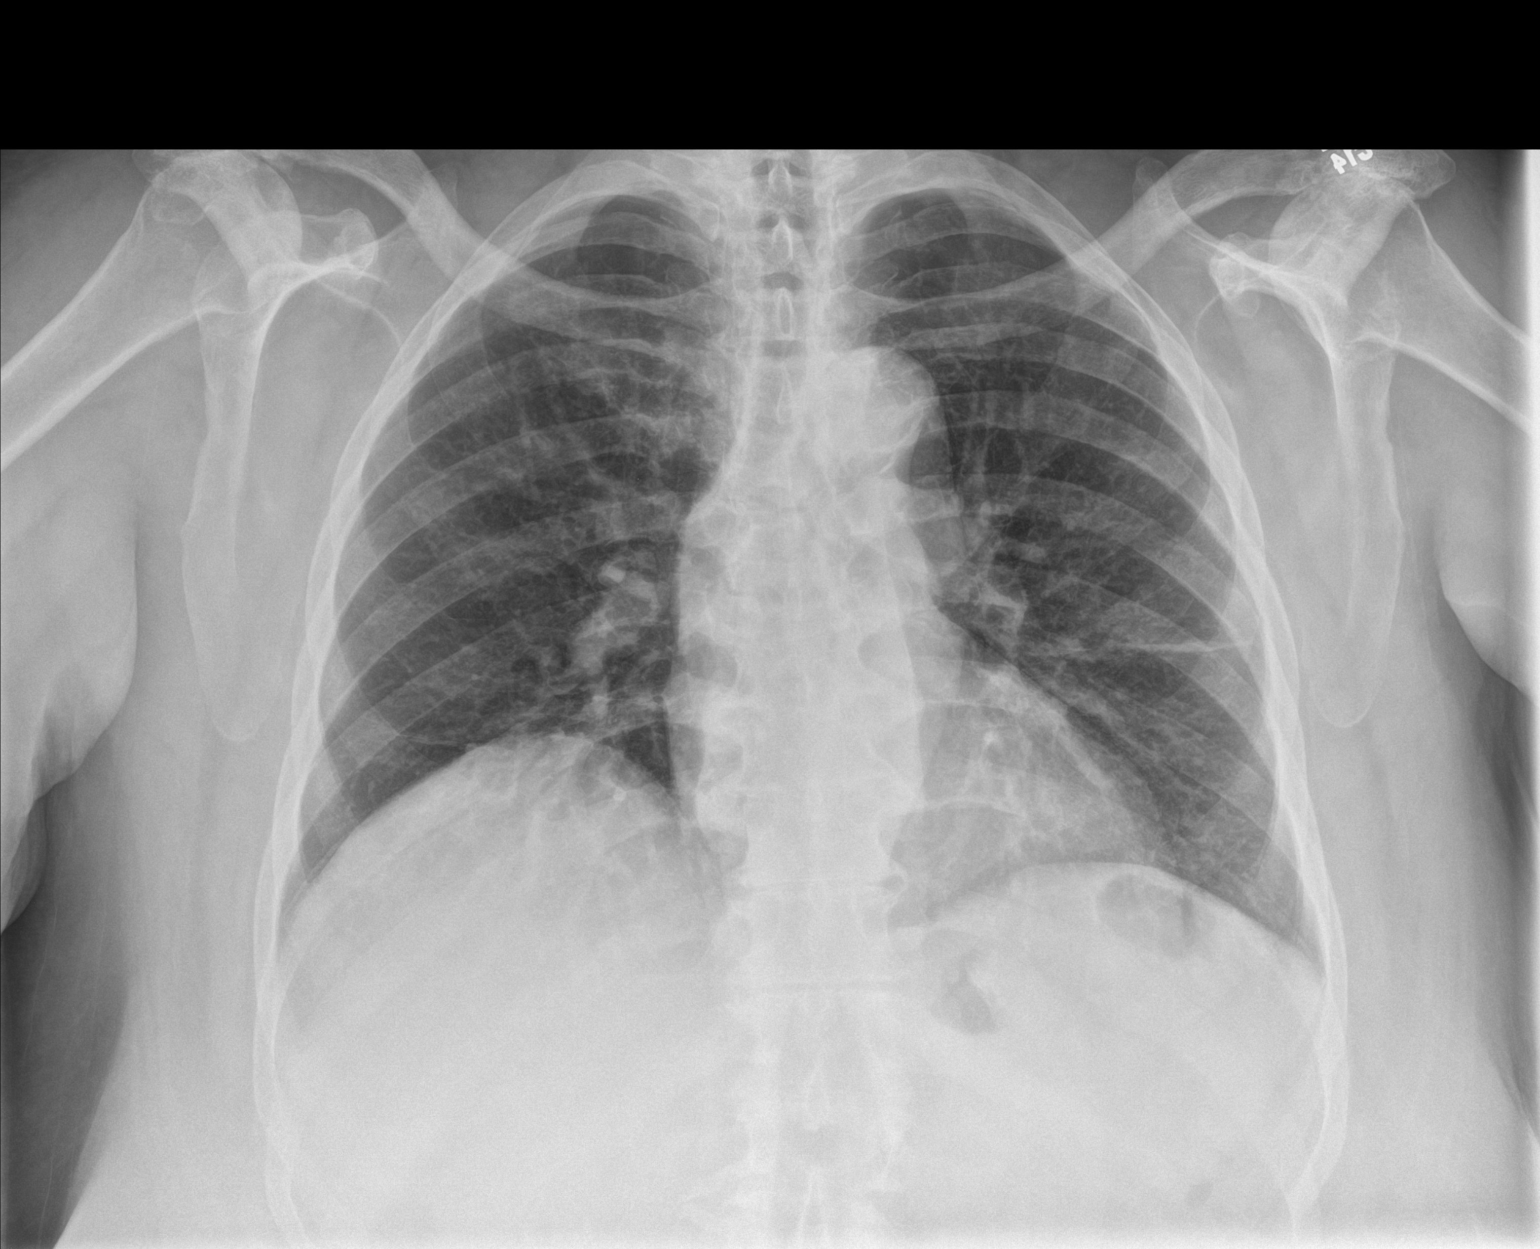

[chest lat]
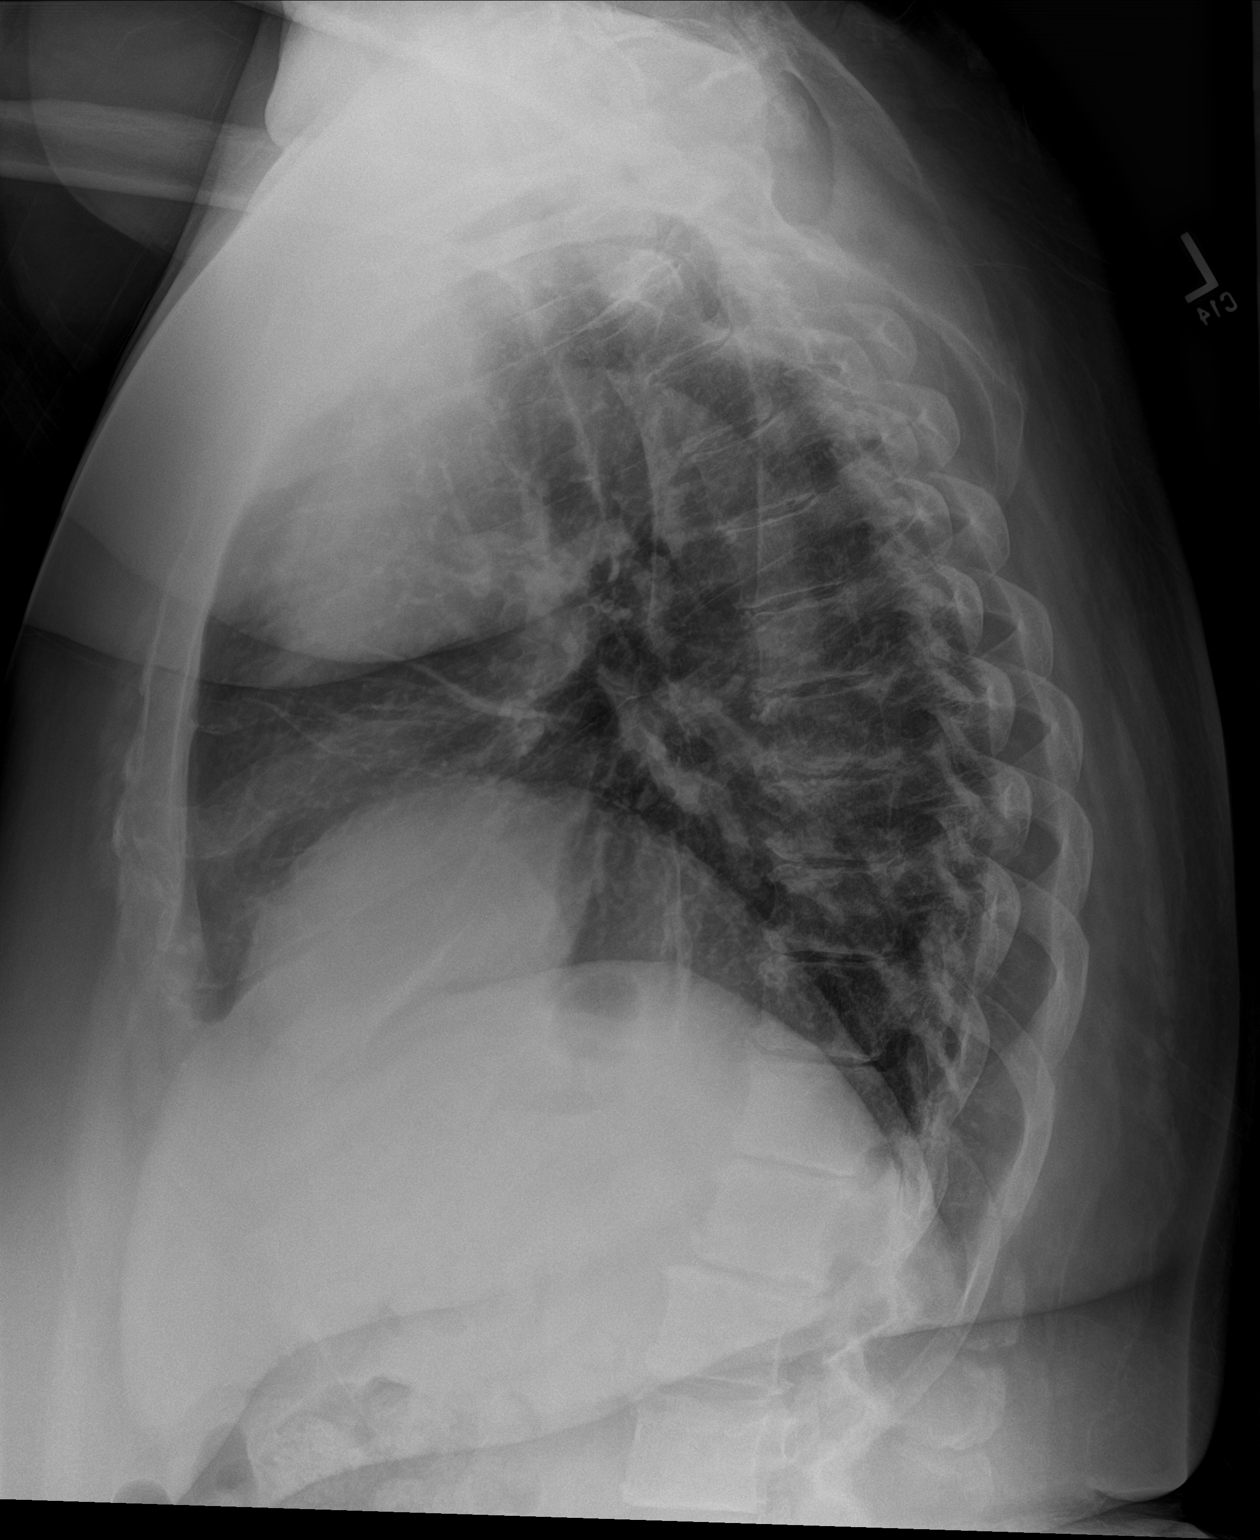

[2 of 2 positions shown; findings below may reference images not displayed]

FINDINGS: Small focus of linear scar in the left upper lobe is unchanged.
Lungs are otherwise clear. Heart size is normal. No pneumothorax or
pleural fluid. Aortic atherosclerosis is seen. No acute bony
abnormality. Marked degenerative change about the shoulders noted.
IMPRESSION: No acute disease.

Atherosclerosis.

## 2020-05-28 ENCOUNTER — Ambulatory Visit: Payer: BC Managed Care – PPO | Admitting: Family Medicine

## 2020-06-08 ENCOUNTER — Other Ambulatory Visit: Payer: Self-pay | Admitting: Family Medicine

## 2020-06-08 DIAGNOSIS — J45991 Cough variant asthma: Secondary | ICD-10-CM

## 2020-07-02 ENCOUNTER — Ambulatory Visit: Payer: BC Managed Care – PPO | Admitting: Family Medicine

## 2020-07-02 ENCOUNTER — Other Ambulatory Visit: Payer: Self-pay | Admitting: Family Medicine

## 2020-07-02 DIAGNOSIS — B369 Superficial mycosis, unspecified: Secondary | ICD-10-CM

## 2020-07-14 ENCOUNTER — Other Ambulatory Visit: Payer: Self-pay

## 2020-07-14 MED ORDER — UNABLE TO FIND: 0 refills | Status: DC

## 2020-08-02 ENCOUNTER — Other Ambulatory Visit: Payer: Self-pay | Admitting: Family Medicine

## 2020-08-02 DIAGNOSIS — I10 Essential (primary) hypertension: Secondary | ICD-10-CM

## 2020-08-10 ENCOUNTER — Ambulatory Visit: Payer: BC Managed Care – PPO | Admitting: Family Medicine

## 2020-08-18 ENCOUNTER — Ambulatory Visit: Payer: Self-pay

## 2020-08-25 ENCOUNTER — Other Ambulatory Visit: Payer: Self-pay

## 2020-08-25 ENCOUNTER — Ambulatory Visit: Payer: BC Managed Care – PPO

## 2020-08-25 VITALS — Ht 64.0 in | Wt 295.0 lb

## 2020-08-25 DIAGNOSIS — Z0000 Encounter for general adult medical examination without abnormal findings: Secondary | ICD-10-CM

## 2020-08-25 DIAGNOSIS — Z1231 Encounter for screening mammogram for malignant neoplasm of breast: Secondary | ICD-10-CM

## 2020-08-25 NOTE — Progress Notes (Signed)
Subjective:   Brandy Dean is a 70 y.o. female who presents for Medicare Annual (Subsequent) preventive examination.  Review of Systems     Cardiac Risk Factors include: dyslipidemia;hypertension;sedentary lifestyle;obesity (BMI >30kg/m2)     Objective:    Today's Vitals   08/25/20 1613 08/25/20 1616  Weight: 295 lb (133.8 kg)   Height: 5\' 4"  (1.626 m)   PainSc: 0-No pain 0-No pain   Body mass index is 50.64 kg/m.  Advanced Directives 09/30/2019 03/25/2019 06/23/2015 07/12/2014  Does Patient Have a Medical Advance Directive? No No No No  Would patient like information on creating a medical advance directive? - No - Patient declined - No - patient declined information    Current Medications (verified) Outpatient Encounter Medications as of 08/25/2020  Medication Sig  . albuterol (ACCUNEB) 1.25 MG/3ML nebulizer solution INHALE CONTENTS OF 1 VIAL USING NEBULIZER EVERY 6 HOURS AS NEEDED FOR WHEEZING  . albuterol (VENTOLIN HFA) 108 (90 Base) MCG/ACT inhaler INHALE 2 PUFFS INTO THE LUNGS EVERY 6 HOURS AS NEEDED FOR WHEEZING OR SHORTNESS OF BREATH  . clotrimazole-betamethasone (LOTRISONE) cream APPLY TOPICALLY TO THE AFFECTED AREA TWICE DAILY  . ergocalciferol (VITAMIN D2) 1.25 MG (50000 UT) capsule Take 1 capsule (50,000 Units total) by mouth once a week. One capsule once weekly  . montelukast (SINGULAIR) 10 MG tablet Take 1 tablet (10 mg total) by mouth at bedtime.  . SYMBICORT 160-4.5 MCG/ACT inhaler Inhale 2 puffs into the lungs 2 (two) times daily.  08/27/2020 triamterene-hydrochlorothiazide (MAXZIDE) 75-50 MG tablet TAKE 1 TABLET BY MOUTH EVERY DAY  . UNABLE TO FIND Please provide with BP cuff that insurance will cover.  Marland Kitchen UNABLE TO FIND Nebulizer machine x 1  Dx J45.991  . UNABLE TO FIND Nebulizer machine (with 2 tubings and mouthpiece) Dx asthma J45.991   No facility-administered encounter medications on file as of 08/25/2020.    Allergies (verified) Benzonatate    History: Past Medical History:  Diagnosis Date  . Bacterial pneumonia    March  9-12,  2011  . Bronchial asthma   . Essential hypertension, benign   . Morbid obesity (HCC)   . Sleep apnea    Past Surgical History:  Procedure Laterality Date  . CESAREAN SECTION  1988 & 1991  . Partial left lobectomy  1968  . RIght and left keloid ears  1968   Family History  Problem Relation Age of Onset  . Stroke Mother   . Hypertension Mother   . Esophageal cancer Father   . Obesity Sister    Social History   Socioeconomic History  . Marital status: Legally Separated    Spouse name: Not on file  . Number of children: 3  . Years of education: Not on file  . Highest education level: Not on file  Occupational History    Employer: [employer]  Tobacco Use  . Smoking status: Former Smoker    Types: Cigarettes    Quit date: 06/14/2007    Years since quitting: 13.2  . Smokeless tobacco: Never Used  Vaping Use  . Vaping Use: Never used  Substance and Sexual Activity  . Alcohol use: No  . Drug use: No  . Sexual activity: Not on file  Other Topics Concern  . Not on file  Social History Narrative  . Not on file   Social Determinants of Health   Financial Resource Strain: Low Risk   . Difficulty of Paying Living Expenses: Not hard at all  Food Insecurity:  No Food Insecurity  . Worried About Programme researcher, broadcasting/film/video in the Last Year: Never true  . Ran Out of Food in the Last Year: Never true  Transportation Needs: No Transportation Needs  . Lack of Transportation (Medical): No  . Lack of Transportation (Non-Medical): No  Physical Activity: Insufficiently Active  . Days of Exercise per Week: 5 days  . Minutes of Exercise per Session: 20 min  Stress: No Stress Concern Present  . Feeling of Stress : Not at all  Social Connections: Moderately Integrated  . Frequency of Communication with Friends and Family: More than three times a week  . Frequency of Social Gatherings  with Friends and Family: More than three times a week  . Attends Religious Services: More than 4 times per year  . Active Member of Clubs or Organizations: Yes  . Attends Banker Meetings: More than 4 times per year  . Marital Status: Separated    Tobacco Counseling Counseling given: Not Answered   Clinical Intake:  Pre-visit preparation completed: Yes  Pain : No/denies pain Pain Score: 0-No pain     Nutritional Status: BMI > 30  Obese Diabetes: No CBG done?: No Did pt. bring in CBG monitor from home?: No  How often do you need to have someone help you when you read instructions, pamphlets, or other written materials from your doctor or pharmacy?: 1 - Never What is the last grade level you completed in school?: college  Diabetic? no  Interpreter Needed?: No  Information entered by :: Ulises Wolfinger   Activities of Daily Living In your present state of health, do you have any difficulty performing the following activities: 08/25/2020  Hearing? N  Vision? N  Difficulty concentrating or making decisions? N  Walking or climbing stairs? Y  Dressing or bathing? N  Doing errands, shopping? N  Preparing Food and eating ? N  Using the Toilet? N  In the past six months, have you accidently leaked urine? N  Do you have problems with loss of bowel control? N  Managing your Medications? N  Managing your Finances? N  Housekeeping or managing your Housekeeping? N  Some recent data might be hidden    Patient Care Team: Kerri Perches, MD as PCP - General [place] Ottie Glazier, MD (Inactive) as PCP - Cardiology (Cardiology)  Indicate any recent Medical Services you may have received from other than [facility] providers in the past year (date may be approximate).     Assessment:   This is a routine wellness examination for [place].  Hearing/Vision screen No exam data present  Dietary issues and exercise activities discussed: Current Exercise Habits: The  patient does not participate in regular exercise at present, Exercise limited by: respiratory conditions(s);orthopedic condition(s)  Goals    . Weight (lb) < 250 lb (113.4 kg)     Wants to lose 50 lbs       Depression Screen PHQ 2/9 Scores 11/20/2019 03/27/2019 02/20/2019 10/17/2018 07/04/2018 06/12/2018 10/31/2017  PHQ - 2 Score 0 0 0 0 0 0 0  PHQ- 9 Score - - - - - - -    Fall Risk Fall Risk  08/25/2020 11/20/2019 07/15/2019 03/27/2019 02/20/2019  Falls in the past year? 1 1 0 0 1  Number falls in past yr: 0 0 0 0 0  Injury with Fall? 0 1 0 0 0    FALL RISK PREVENTION PERTAINING TO THE HOME:  Any stairs in or around the home? Yes  If so, are there any without handrails? No  Home free of loose throw rugs in walkways, pet beds, electrical cords, etc? Yes  Adequate lighting in your home to reduce risk of falls? Yes   ASSISTIVE DEVICES UTILIZED TO PREVENT FALLS:  Life alert? No  Use of a cane, walker or w/c? No  Grab bars in the bathroom? Yes  Shower chair or bench in shower? Yes  Elevated toilet seat or a handicapped toilet? No   TIMED UP AND GO:  Was the test performed? Yes .  Length of time to ambulate 10 feet: 8 sec.   Gait steady and fast without use of assistive device  Cognitive Function:     6CIT Screen 08/25/2020  What Year? 0 points  What month? 0 points  What time? 0 points  Count back from 20 0 points  Months in reverse 0 points  Repeat phrase 0 points  Total Score 0    Immunizations Immunization History  Administered Date(s) Administered  . Fluad Quad(high Dose 65+) 03/27/2019  . Influenza Split 03/05/2012  . Influenza, High Dose Seasonal PF 06/12/2018  . Influenza,inj,Quad PF,6+ Mos 06/02/2016, 04/17/2017  . Moderna Sars-Covid-2 Vaccination 12/04/2019, 01/01/2020  . Pneumococcal Conjugate-13 10/31/2017  . Pneumococcal Polysaccharide-23 08/09/2012, 03/27/2019  . Tdap 03/05/2012  . Zoster 08/09/2012    TDAP status: Up to date  Flu Vaccine status:  Declined, Education has been provided regarding the importance of this vaccine but patient still declined. Advised may receive this vaccine at local pharmacy or Health Dept. Aware to provide a copy of the vaccination record if obtained from local pharmacy or Health Dept. Verbalized acceptance and understanding.  Pneumococcal vaccine status: Up to date  Covid-19 vaccine status: Completed vaccines  Qualifies for Shingles Vaccine? Yes   Zostavax completed yes Shingrix Completed?: No.    Education has been provided regarding the importance of this vaccine. Patient has been advised to call insurance company to determine out of pocket expense if they have not yet received this vaccine. Advised may also receive vaccine at local pharmacy or Health Dept. Verbalized acceptance and understanding.  Screening Tests Health Maintenance  Topic Date Due  . COLONOSCOPY (Pts 45-1yrs Insurance coverage will need to be confirmed)  Never done  . COVID-19 Vaccine (3 - Booster for Moderna series) 07/03/2020  . INFLUENZA VACCINE  10/11/2020 (Originally 01/12/2020)  . MAMMOGRAM  07/18/2021  . TETANUS/TDAP  03/05/2022  . DEXA SCAN  Completed  . Hepatitis C Screening  Completed  . PNA vac Low Risk Adult  Completed  . HPV VACCINES  Aged Out    Health Maintenance  Health Maintenance Due  Topic Date Due  . COLONOSCOPY (Pts 45-22yrs Insurance coverage will need to be confirmed)  Never done  . COVID-19 Vaccine (3 - Booster for Moderna series) 07/03/2020    Colorectal cancer screening: Type of screening: Cologuard. Completed yes. Repeat every 3 years  Mammogram status: Ordered yes. Pt provided with contact info and advised to call to schedule appt.   Bone Density status: Completed yes. Results reflect: Bone density results: NORMAL. Repeat every 3 years.  Lung Cancer Screening: (Low Dose CT Chest recommended if Age 31-80 years, 30 pack-year currently smoking OR have quit w/in 15years.) does not qualify.   Lung  Cancer Screening Referral: does not qualify   Additional Screening:  Hepatitis C Screening: does qualify; Completed yes  Vision Screening: Recommended annual ophthalmology exams for early detection of glaucoma and other disorders of the eye. Is the patient  up to date with their annual eye exam?  Yes  Who is the provider or what is the name of the office in which the patient attends annual eye exams? Doesn't have eye dr  If pt is not established with a provider, would they like to be referred to a provider to establish care? No .   Dental Screening: Recommended annual dental exams for proper oral hygiene  Community Resource Referral / Chronic Care Management: CRR required this visit?  No   CCM required this visit?  No      Plan:     I have personally reviewed and noted the following in the patient's chart:   . Medical and social history . Use of alcohol, tobacco or illicit drugs  . Current medications and supplements . Functional ability and status . Nutritional status . Physical activity . Advanced directives . List of other physicians . Hospitalizations, surgeries, and ER visits in previous 12 months . Vitals . Screenings to include cognitive, depression, and falls . Referrals and appointments  In addition, I have reviewed and discussed with patient certain preventive protocols, quality metrics, and best practice recommendations. A written personalized care plan for preventive services as well as general preventive health recommendations were provided to patient.     Everitt AmberBrandi Yordan [place], LPN, LPN   [id number]/15/2022   Nurse Notes:

## 2020-08-25 NOTE — Patient Instructions (Signed)
[name] , Thank you for taking time to come for your Medicare Wellness Visit. I appreciate your ongoing commitment to your health goals. Please review the following plan we discussed and let me know if I can assist you in the future.   Screening recommendations/referrals: Colonoscopy: cologuard completed in 2020 Mammogram: scheduled  Bone Density: up to date  Recommended yearly ophthalmology/optometry visit for glaucoma screening and checkup Recommended yearly dental visit for hygiene and checkup  Vaccinations: Influenza vaccine: due in fall 2022 Pneumococcal vaccine: up to date  Tdap vaccine: up to date  Shingles vaccine: needs to get at pharmacy if wanted         Next appointment: August 26 2021 4:00pm   Preventive Care 65 Years and Older, Female Preventive care refers to lifestyle choices and visits with your health care provider that can promote health and wellness. What does preventive care include?  A yearly physical exam. This is also called an annual well check.  Dental exams once or twice a year.  Routine eye exams. Ask your health care provider how often you should have your eyes checked.  Personal lifestyle choices, including:  Daily care of your teeth and gums.  Regular physical activity.  Eating a healthy diet.  Avoiding tobacco and drug use.  Limiting alcohol use.  Practicing safe sex.  Taking low-dose aspirin every day.  Taking vitamin and mineral supplements as recommended by your health care provider. What happens during an annual well check? The services and screenings done by your health care provider during your annual well check will depend on your age, overall health, lifestyle risk factors, and family history of disease. Counseling  Your health care provider may ask you questions about your:  Alcohol use.  Tobacco use.  Drug use.  Emotional well-being.  Home and relationship well-being.  Sexual activity.  Eating  habits.  History of falls.  Memory and ability to understand (cognition).  Work and work Astronomer.  Reproductive health. Screening  You may have the following tests or measurements:  Height, weight, and BMI.  Blood pressure.  Lipid and cholesterol levels. These may be checked every 5 years, or more frequently if you are over 43 years old.  Skin check.  Lung cancer screening. You may have this screening every year starting at age 20 if you have a 30-pack-year history of smoking and currently smoke or have quit within the past 15 years.  Fecal occult blood test (FOBT) of the stool. You may have this test every year starting at age 67.  Flexible sigmoidoscopy or colonoscopy. You may have a sigmoidoscopy every 5 years or a colonoscopy every 10 years starting at age 5.  Hepatitis C blood test.  Hepatitis B blood test.  Sexually transmitted disease (STD) testing.  Diabetes screening. This is done by checking your blood sugar (glucose) after you have not eaten for a while (fasting). You may have this done every 1-3 years.  Bone density scan. This is done to screen for osteoporosis. You may have this done starting at age 37.  Mammogram. This may be done every 1-2 years. Talk to your health care provider about how often you should have regular mammograms. Talk with your health care provider about your test results, treatment options, and if necessary, the need for more tests. Vaccines  Your health care provider may recommend certain vaccines, such as:  Influenza vaccine. This is recommended every year.  Tetanus, diphtheria, and acellular pertussis (Tdap, Td) vaccine. You may need a  Td booster every 10 years.  Zoster vaccine. You may need this after age 37.  Pneumococcal 13-valent conjugate (PCV13) vaccine. One dose is recommended after age 73.  Pneumococcal polysaccharide (PPSV23) vaccine. One dose is recommended after age 37. Talk to your health care provider about which  screenings and vaccines you need and how often you need them. This information is not intended to replace advice given to you by your health care provider. Make sure you discuss any questions you have with your health care provider. Document Released: 06/26/2015 Document Revised: 02/17/2016 Document Reviewed: 03/31/2015 Elsevier Interactive Patient Education  2017 [place] Prevention in the Home Falls can cause injuries. They can happen to people of all ages. There are many things you can do to make your home safe and to help prevent falls. What can I do on the outside of my home?  Regularly fix the edges of walkways and driveways and fix any cracks.  Remove anything that might make you trip as you walk through a door, such as a raised step or threshold.  Trim any bushes or trees on the path to your home.  Use bright outdoor lighting.  Clear any walking paths of anything that might make someone trip, such as rocks or tools.  Regularly check to see if handrails are loose or broken. Make sure that both sides of any steps have handrails.  Any raised decks and porches should have guardrails on the edges.  Have any leaves, snow, or ice cleared regularly.  Use sand or salt on walking paths during winter.  Clean up any spills in your garage right away. This includes oil or grease spills. What can I do in the bathroom?  Use night lights.  Install grab bars by the toilet and in the tub and shower. Do not use towel bars as grab bars.  Use non-skid mats or decals in the tub or shower.  If you need to sit down in the shower, use a plastic, non-slip stool.  Keep the floor dry. Clean up any water that spills on the floor as soon as it happens.  Remove soap buildup in the tub or shower regularly.  Attach bath mats securely with double-sided non-slip rug tape.  Do not have throw rugs and other things on the floor that can make you trip. What can I do in the bedroom?  Use  night lights.  Make sure that you have a light by your bed that is easy to reach.  Do not use any sheets or blankets that are too big for your bed. They should not hang down onto the floor.  Have a firm chair that has side arms. You can use this for support while you get dressed.  Do not have throw rugs and other things on the floor that can make you trip. What can I do in the kitchen?  Clean up any spills right away.  Avoid walking on wet floors.  Keep items that you use a lot in easy-to-reach places.  If you need to reach something above you, use a strong step stool that has a grab bar.  Keep electrical cords out of the way.  Do not use floor polish or wax that makes floors slippery. If you must use wax, use non-skid floor wax.  Do not have throw rugs and other things on the floor that can make you trip. What can I do with my stairs?  Do not leave any items on the stairs.  Make sure that there are handrails on both sides of the stairs and use them. Fix handrails that are broken or loose. Make sure that handrails are as long as the stairways.  Check any carpeting to make sure that it is firmly attached to the stairs. Fix any carpet that is loose or worn.  Avoid having throw rugs at the top or bottom of the stairs. If you do have throw rugs, attach them to the floor with carpet tape.  Make sure that you have a light switch at the top of the stairs and the bottom of the stairs. If you do not have them, ask someone to add them for you. What else can I do to help prevent falls?  Wear shoes that:  Do not have high heels.  Have rubber bottoms.  Are comfortable and fit you well.  Are closed at the toe. Do not wear sandals.  If you use a stepladder:  Make sure that it is fully opened. Do not climb a closed stepladder.  Make sure that both sides of the stepladder are locked into place.  Ask someone to hold it for you, if possible.  Clearly mark and make sure that you  can see:  Any grab bars or handrails.  First and last steps.  Where the edge of each step is.  Use tools that help you move around (mobility aids) if they are needed. These include:  Canes.  Walkers.  Scooters.  Crutches.  Turn on the lights when you go into a dark area. Replace any light bulbs as soon as they burn out.  Set up your furniture so you have a clear path. Avoid moving your furniture around.  If any of your floors are uneven, fix them.  If there are any pets around you, be aware of where they are.  Review your medicines with your doctor. Some medicines can make you feel dizzy. This can increase your chance of falling. Ask your doctor what other things that you can do to help prevent falls. This information is not intended to replace advice given to you by your health care provider. Make sure you discuss any questions you have with your health care provider. Document Released: 03/26/2009 Document Revised: 11/05/2015 Document Reviewed: 07/04/2014 Elsevier Interactive Patient Education  2017 [employer].

## 2020-08-26 ENCOUNTER — Other Ambulatory Visit: Payer: Self-pay

## 2020-08-26 DIAGNOSIS — J45991 Cough variant asthma: Secondary | ICD-10-CM

## 2020-08-26 MED ORDER — MONTELUKAST SODIUM 10 MG PO TABS: 10.0000 mg | ORAL_TABLET | Freq: Every day | ORAL | 3 refills | Status: DC

## 2020-08-31 ENCOUNTER — Other Ambulatory Visit: Payer: Self-pay | Admitting: Family Medicine

## 2020-09-07 ENCOUNTER — Ambulatory Visit (HOSPITAL_COMMUNITY): Payer: BC Managed Care – PPO

## 2020-09-08 ENCOUNTER — Other Ambulatory Visit: Payer: Self-pay

## 2020-09-08 ENCOUNTER — Emergency Department (HOSPITAL_COMMUNITY): Payer: BC Managed Care – PPO

## 2020-09-08 ENCOUNTER — Encounter (HOSPITAL_COMMUNITY): Payer: Self-pay | Admitting: *Deleted

## 2020-09-08 ENCOUNTER — Emergency Department (HOSPITAL_COMMUNITY)
Admission: EM | Admit: 2020-09-08 | Discharge: 2020-09-08 | Disposition: A | Discharge status: Home / Self Care | Payer: BC Managed Care – PPO | Attending: Emergency Medicine | Admitting: Emergency Medicine

## 2020-09-08 DIAGNOSIS — Z79899 Other long term (current) drug therapy: Secondary | ICD-10-CM | POA: Diagnosis not present

## 2020-09-08 DIAGNOSIS — S20211A Contusion of right front wall of thorax, initial encounter: Secondary | ICD-10-CM

## 2020-09-08 DIAGNOSIS — R21 Rash and other nonspecific skin eruption: Secondary | ICD-10-CM

## 2020-09-08 DIAGNOSIS — Z87891 Personal history of nicotine dependence: Secondary | ICD-10-CM | POA: Insufficient documentation

## 2020-09-08 DIAGNOSIS — R0602 Shortness of breath: Secondary | ICD-10-CM | POA: Insufficient documentation

## 2020-09-08 DIAGNOSIS — Z7951 Long term (current) use of inhaled steroids: Secondary | ICD-10-CM | POA: Diagnosis not present

## 2020-09-08 DIAGNOSIS — Z8616 Personal history of COVID-19: Secondary | ICD-10-CM | POA: Insufficient documentation

## 2020-09-08 DIAGNOSIS — J45909 Unspecified asthma, uncomplicated: Secondary | ICD-10-CM | POA: Insufficient documentation

## 2020-09-08 DIAGNOSIS — S299XXA Unspecified injury of thorax, initial encounter: Secondary | ICD-10-CM | POA: Diagnosis present

## 2020-09-08 DIAGNOSIS — W19XXXA Unspecified fall, initial encounter: Secondary | ICD-10-CM | POA: Insufficient documentation

## 2020-09-08 DIAGNOSIS — I10 Essential (primary) hypertension: Secondary | ICD-10-CM | POA: Insufficient documentation

## 2020-09-08 MED ORDER — PREDNISONE 20 MG PO TABS: ORAL_TABLET | ORAL | 0 refills | Status: DC

## 2020-09-08 MED ORDER — HYDROCODONE-ACETAMINOPHEN 5-325 MG PO TABS
1.0000 | ORAL_TABLET | Freq: Once | ORAL | Status: AC
  Administered 2020-09-08: 1 via ORAL
  Filled 2020-09-08: qty 1

## 2020-09-08 MED ORDER — METHYLPREDNISOLONE SODIUM SUCC 125 MG IJ SOLR
125.0000 mg | Freq: Once | INTRAMUSCULAR | Status: AC
  Administered 2020-09-08: 125 mg via INTRAMUSCULAR
  Filled 2020-09-08: qty 2

## 2020-09-08 MED ORDER — HYDROCODONE-ACETAMINOPHEN 5-325 MG PO TABS: 1.0000 | ORAL_TABLET | Freq: Four times a day (QID) | ORAL | 0 refills | Status: DC | PRN

## 2020-09-08 MED ORDER — FAMOTIDINE 20 MG PO TABS: 20.0000 mg | ORAL_TABLET | Freq: Two times a day (BID) | ORAL | 0 refills | Status: DC

## 2020-09-08 NOTE — ED Triage Notes (Signed)
Pt with sob x 2 days, fell last night and c/o right side pain.  Rash for a month, seen at urgent care for it and prescribed medicines for it and cleared up, has returned this past week.  Denies hitting her head.  Denies taking blood thinners.

## 2020-09-08 NOTE — Discharge Instructions (Signed)
Follow-up with your family doctor next week for recheck. 

## 2020-09-08 NOTE — ED Notes (Signed)
Pt left with d/c papers before signing for discharge

## 2020-09-08 NOTE — ED Provider Notes (Signed)
[facility] EMERGENCY DEPARTMENT Provider Note   CSN: [id number] Arrival date & time: 09/08/20  1902     History Chief Complaint  Patient presents with  . Shortness of Breath    Brandy Dean is a 70 y.o. female.  Patient states she has a history of a rash that went away and now has come back and also she fell and complains of pain on her right side of her chest  The history is provided by the patient and medical records.  Shortness of Breath Severity:  Mild Onset quality:  Sudden Timing:  Intermittent Progression:  Waxing and waning Chronicity:  New Relieved by:  Nothing Worsened by:  Nothing Ineffective treatments:  None tried Associated symptoms: no abdominal pain, no chest pain, no cough, no headaches and no rash        Past Medical History:  Diagnosis Date  . Bacterial pneumonia    March  9-12,  2011  . Bronchial asthma   . Essential hypertension, benign   . Morbid obesity (HCC)   . Sleep apnea     Patient Active Problem List   Diagnosis Date Noted  . Vitamin D deficiency 11/21/2019  . COVID-19 07/07/2019  . Shoulder pain 06/26/2017  . Dyspepsia 04/23/2017  . Cholelithiasis 07/19/2014  . Nodule of left lung 06/12/2013  . Prediabetes 11/07/2011  . Heart murmur 05/11/2011  . Hyperlipidemia with target LDL less than 100 04/24/2011  . Shortness of breath on exertion 04/20/2011  . Morbid obesity with BMI of 50.0-59.9, adult (HCC) 09/27/2007  . Essential hypertension 09/27/2007  . Asthma, cough variant 09/27/2007    Past Surgical History:  Procedure Laterality Date  . CESAREAN SECTION  1988 & 1991  . Partial left lobectomy  1968  . RIght and left keloid ears  1968     OB History   No obstetric history on file.     Family History  Problem Relation Age of Onset  . Stroke Mother   . Hypertension Mother   . Esophageal cancer Father   . Obesity Sister     Social History   Tobacco Use  . Smoking status: Former Smoker    Types: Cigarettes     Quit date: 06/14/2007    Years since quitting: 13.2  . Smokeless tobacco: Never Used  Vaping Use  . Vaping Use: Never used  Substance Use Topics  . Alcohol use: No  . Drug use: No    Home Medications Prior to Admission medications   Medication Sig Start Date End Date Taking? Authorizing Provider  famotidine (PEPCID) 20 MG tablet Take 1 tablet (20 mg total) by mouth 2 (two) times daily. 09/08/20  Yes Bethann Berkshire, MD  HYDROcodone-acetaminophen (NORCO/VICODIN) 5-325 MG tablet Take 1 tablet by mouth every 6 (six) hours as needed. 09/08/20  Yes Bethann Berkshire, MD  predniSONE (DELTASONE) 20 MG tablet One bid for 5 days 09/08/20  Yes Bethann Berkshire, MD  albuterol (ACCUNEB) 1.25 MG/3ML nebulizer solution INHALE CONTENTS OF 1 VIAL USING NEBULIZER EVERY 6 HOURS AS NEEDED FOR WHEEZING Patient not taking: Reported on 09/08/2020 06/09/20   Kerri Perches, MD  albuterol (VENTOLIN HFA) 108 (90 Base) MCG/ACT inhaler INHALE 2 PUFFS INTO THE LUNGS EVERY 6 HOURS AS NEEDED FOR WHEEZING OR SHORTNESS OF BREATH 08/31/20   Kerri Perches, MD  clotrimazole-betamethasone (LOTRISONE) cream APPLY TOPICALLY TO THE AFFECTED AREA TWICE DAILY 07/02/20   Kerri Perches, MD  ergocalciferol (VITAMIN D2) 1.25 MG (50000 UT) capsule Take 1  capsule (50,000 Units total) by mouth once a week. One capsule once weekly 09/25/19   Freddy Finner, NP  montelukast (SINGULAIR) 10 MG tablet Take 1 tablet (10 mg total) by mouth at bedtime. 08/26/20   Kerri Perches, MD  SYMBICORT 160-4.5 MCG/ACT inhaler Inhale 2 puffs into the lungs 2 (two) times daily. 11/20/19   Kerri Perches, MD  triamterene-hydrochlorothiazide (MAXZIDE) 75-50 MG tablet TAKE 1 TABLET BY MOUTH EVERY DAY 08/03/20   Kerri Perches, MD  UNABLE TO FIND Please provide with BP cuff that insurance will cover. 02/20/19   Freddy Finner, NP  UNABLE TO FIND Nebulizer machine x 1  Dx U76.546 03/19/19   Kerri Perches, MD  UNABLE TO FIND Nebulizer  machine (with 2 tubings and mouthpiece) Dx asthma J45.991 07/14/20   Kerri Perches, MD    Allergies    Benzonatate, Penicillins, and Prenat-fecbn-febisg-fa-fishoil  Review of Systems   Review of Systems  Constitutional: Negative for appetite change and fatigue.  HENT: Negative for congestion, ear discharge and sinus pressure.   Eyes: Negative for discharge.  Respiratory: Positive for shortness of breath. Negative for cough.   Cardiovascular: Negative for chest pain.  Gastrointestinal: Negative for abdominal pain and diarrhea.  Genitourinary: Negative for frequency and hematuria.  Musculoskeletal: Negative for back pain.  Skin: Negative for rash.       Rash  Neurological: Negative for seizures and headaches.  Psychiatric/Behavioral: Negative for hallucinations.    Physical Exam Updated Vital Signs BP (!) 114/58   Pulse 66   Temp 98.7 F (37.1 C) (Oral)   Resp 18   Ht 5\' 4"  (1.626 m)   Wt 131.5 kg   SpO2 96%   BMI 49.78 kg/m   Physical Exam Vitals and nursing note reviewed.  Constitutional:      Appearance: She is well-developed.  HENT:     Head: Normocephalic.     Nose: Nose normal.  Eyes:     General: No scleral icterus.    Conjunctiva/sclera: Conjunctivae normal.  Neck:     Thyroid: No thyromegaly.  Cardiovascular:     Rate and Rhythm: Normal rate and regular rhythm.     Heart sounds: No murmur heard. No friction rub. No gallop.   Pulmonary:     Breath sounds: No stridor. No wheezing or rales.     Comments: Tender right chest Chest:     Chest wall: Tenderness present.  Abdominal:     General: There is no distension.     Tenderness: There is no abdominal tenderness. There is no rebound.  Musculoskeletal:        General: Normal range of motion.     Cervical back: Neck supple.  Lymphadenopathy:     Cervical: No cervical adenopathy.  Skin:    Findings: Erythema and rash present.  Neurological:     Mental Status: She is alert and oriented to person,  place, and time.     Motor: No abnormal muscle tone.     Coordination: Coordination normal.  Psychiatric:        Behavior: Behavior normal.     ED Results / Procedures / Treatments   Labs (all labs ordered are listed, but only abnormal results are displayed) Labs Reviewed - No data to display  EKG None  Radiology DG Chest [facility] 1 View  Result Date: 09/08/2020 CLINICAL DATA:  Short of breath for 2 days, fell, right-sided pain EXAM: PORTABLE CHEST 1 VIEW COMPARISON:  09/30/2019 FINDINGS: Single  frontal view of the chest demonstrates an unremarkable cardiac silhouette. No acute airspace disease, effusion, or pneumothorax. No acute displaced fractures. Stable degenerative changes of the bilateral shoulders. IMPRESSION: 1. No acute intrathoracic process. Electronically Signed   By: Sharlet Salina M.D.   On: 09/08/2020 19:53    Procedures Procedures   Medications Ordered in ED Medications  methylPREDNISolone sodium succinate (SOLU-MEDROL) 125 mg/2 mL injection 125 mg (has no administration in time range)  HYDROcodone-acetaminophen (NORCO/VICODIN) 5-325 MG per tablet 1 tablet (1 tablet Oral Given 09/08/20 2003)    ED Course  I have reviewed the triage vital signs and the nursing notes.  Pertinent labs & imaging results that were available during my care of the patient were reviewed by me and considered in my medical decision making (see chart for details).    MDM Rules/Calculators/A&P                          Patient with contusion to right side of chest.  Chest x-ray clear.  She will be placed on hydrocodone for the pain.  Patient also has a rash to her arms and chest she is given a shot of Solu-Medrol and put on prednisone and Pepcid for 5 days and will follow up with PCP Final Clinical Impression(s) / ED Diagnoses Final diagnoses:  Contusion of right chest wall, initial encounter  Rash    Rx / DC Orders ED Discharge Orders         Ordered    predniSONE (DELTASONE) 20 MG  tablet        09/08/20 2208    famotidine (PEPCID) 20 MG tablet  2 times daily        09/08/20 2208    HYDROcodone-acetaminophen (NORCO/VICODIN) 5-325 MG tablet  Every 6 hours PRN        09/08/20 2208           Bethann Berkshire, MD 09/13/20 [phone]

## 2020-12-15 ENCOUNTER — Other Ambulatory Visit: Payer: Self-pay | Admitting: Family Medicine

## 2020-12-15 DIAGNOSIS — B369 Superficial mycosis, unspecified: Secondary | ICD-10-CM

## 2020-12-17 ENCOUNTER — Other Ambulatory Visit: Payer: Self-pay

## 2020-12-17 ENCOUNTER — Ambulatory Visit (HOSPITAL_COMMUNITY)
Admission: RE | Admit: 2020-12-17 | Discharge: 2020-12-17 | Disposition: A | Discharge status: Home / Self Care | Payer: BC Managed Care – PPO | Source: Ambulatory Visit | Attending: Internal Medicine | Admitting: Internal Medicine

## 2020-12-17 DIAGNOSIS — Z1231 Encounter for screening mammogram for malignant neoplasm of breast: Secondary | ICD-10-CM | POA: Diagnosis present

## 2021-02-16 ENCOUNTER — Other Ambulatory Visit: Payer: Self-pay | Admitting: Family Medicine

## 2021-02-16 DIAGNOSIS — J45991 Cough variant asthma: Secondary | ICD-10-CM

## 2021-02-24 ENCOUNTER — Other Ambulatory Visit: Payer: Self-pay | Admitting: Family Medicine

## 2021-03-04 ENCOUNTER — Other Ambulatory Visit: Payer: Self-pay | Admitting: Family Medicine

## 2021-03-05 ENCOUNTER — Other Ambulatory Visit: Payer: Self-pay

## 2021-03-05 ENCOUNTER — Telehealth: Payer: Self-pay

## 2021-03-05 MED ORDER — SYMBICORT 160-4.5 MCG/ACT IN AERO: 2.0000 | INHALATION_SPRAY | Freq: Two times a day (BID) | RESPIRATORY_TRACT | 2 refills | Status: DC

## 2021-03-05 NOTE — Telephone Encounter (Signed)
Patient called she states her pharmacy never received her symbicort inhaler and she needs it bad ph# [phone]

## 2021-03-05 NOTE — Telephone Encounter (Signed)
Rx sent 

## 2021-03-08 ENCOUNTER — Other Ambulatory Visit: Payer: Self-pay | Admitting: Family Medicine

## 2021-03-09 ENCOUNTER — Telehealth: Payer: Self-pay | Admitting: Family Medicine

## 2021-03-09 NOTE — Telephone Encounter (Signed)
error 

## 2021-03-09 NOTE — Telephone Encounter (Signed)
Patient called need refill on SYMBICORT 160-4.5 MCG/ACT inhaler [[id number]]

## 2021-04-01 ENCOUNTER — Other Ambulatory Visit: Payer: Self-pay | Admitting: Family Medicine

## 2021-04-01 DIAGNOSIS — I10 Essential (primary) hypertension: Secondary | ICD-10-CM

## 2021-05-08 ENCOUNTER — Other Ambulatory Visit: Payer: Self-pay | Admitting: Family Medicine

## 2021-05-08 DIAGNOSIS — B369 Superficial mycosis, unspecified: Secondary | ICD-10-CM

## 2021-06-07 ENCOUNTER — Other Ambulatory Visit: Payer: Self-pay | Admitting: Family Medicine

## 2021-07-22 ENCOUNTER — Other Ambulatory Visit: Payer: Self-pay | Admitting: Family Medicine

## 2021-07-22 DIAGNOSIS — B369 Superficial mycosis, unspecified: Secondary | ICD-10-CM

## 2021-08-26 ENCOUNTER — Ambulatory Visit: Payer: BC Managed Care – PPO

## 2021-09-15 ENCOUNTER — Other Ambulatory Visit: Payer: Self-pay | Admitting: Family Medicine

## 2021-09-30 ENCOUNTER — Other Ambulatory Visit: Payer: Self-pay | Admitting: Family Medicine

## 2021-09-30 DIAGNOSIS — I10 Essential (primary) hypertension: Secondary | ICD-10-CM

## 2021-10-10 ENCOUNTER — Other Ambulatory Visit: Payer: Self-pay | Admitting: Family Medicine

## 2021-10-10 DIAGNOSIS — J45991 Cough variant asthma: Secondary | ICD-10-CM

## 2021-10-14 ENCOUNTER — Encounter: Payer: Self-pay | Admitting: Family Medicine

## 2021-10-14 ENCOUNTER — Ambulatory Visit: Payer: BC Managed Care – PPO | Admitting: Family Medicine

## 2021-10-14 VITALS — BP 157/82 | HR 65 | Resp 16 | Ht 65.0 in | Wt 292.1 lb

## 2021-10-14 DIAGNOSIS — Z1231 Encounter for screening mammogram for malignant neoplasm of breast: Secondary | ICD-10-CM

## 2021-10-14 DIAGNOSIS — Z6841 Body Mass Index (BMI) 40.0 and over, adult: Secondary | ICD-10-CM

## 2021-10-14 DIAGNOSIS — E785 Hyperlipidemia, unspecified: Secondary | ICD-10-CM

## 2021-10-14 DIAGNOSIS — E559 Vitamin D deficiency, unspecified: Secondary | ICD-10-CM | POA: Diagnosis not present

## 2021-10-14 DIAGNOSIS — I10 Essential (primary) hypertension: Secondary | ICD-10-CM

## 2021-10-14 DIAGNOSIS — Z1211 Encounter for screening for malignant neoplasm of colon: Secondary | ICD-10-CM

## 2021-10-14 DIAGNOSIS — R0602 Shortness of breath: Secondary | ICD-10-CM

## 2021-10-14 DIAGNOSIS — R7303 Prediabetes: Secondary | ICD-10-CM

## 2021-10-14 DIAGNOSIS — J45991 Cough variant asthma: Secondary | ICD-10-CM

## 2021-10-14 MED ORDER — AMLODIPINE BESYLATE 5 MG PO TABS: 5.0000 mg | ORAL_TABLET | Freq: Every day | ORAL | 3 refills | Status: DC

## 2021-10-14 MED ORDER — MONTELUKAST SODIUM 10 MG PO TABS: 10.0000 mg | ORAL_TABLET | Freq: Every day | ORAL | 3 refills | Status: DC

## 2021-10-14 NOTE — Patient Instructions (Signed)
Annual exam and re eval blood pressure in 8 weeks, call if you need me sooner ? ?New additional; med for b.lood pressure is amlodipine 5 mg daily ? ?New additional medication for asthma and allergies is montelukast daily ? ?Please get fasting cBC, lipid, cmp and erGFR, hBA1C, TSH and vit D level in the next 1 week ? ?Please arrange cologuard test ? ?EKG today and referral to Cardiology, shortness of breath is more likely due to uncontrolled high blood pressure and not your asthma ? ? ?Please schedule mammogram at checkout ? ?It is important that you exercise regularly at least 30 minutes 5 times a week. If you develop chest pain, have severe difficulty breathing, or feel very tired, stop exercising immediately and seek medical attention  ?Thanks for choosing [facility], we consider it a privelige to serve you. ? ?

## 2021-10-14 NOTE — Progress Notes (Signed)
? ?Brandy Dean     MRN: [id number]      DOB: 06-06-1951 ? ? ?HPI ?Ms. Borruso is here for follow up and re-evaluation of chronic medical conditions, medication management and review of any available recent lab and radiology data.  ?Preventive health is updated, specifically  Cancer screening and Immunization.   ?Questions or concerns regarding consultations or procedures which the PT has had in the interim are  addressed. ?The PT denies any adverse reac/o increased shortness of breathg and poor exercise tolerance in past several monthsThere are no new concerns.  ?There are no specific complaints  ? ?ROS ?Denies recent fever or chills. ?Denies sinus pressure, nasal congestion, ear pain or sore throat. ?Denies chest congestion, productive cough or wheezing. ?Denies chest pains, palpitations and leg swelling ?Denies abdominal pain, nausea, vomiting,diarrhea or constipation.   ?Denies dysuria, frequency, hesitancy or incontinence. ?Denies joint pain, swelling and limitation in mobility. ?Denies headaches, seizures, numbness, or tingling. ?Denies depression, anxiety or insomnia. ?Denies skin break down or rash. ? ? ?PE ? ?BP (!) 157/82   Pulse 65   Resp 16   Ht 5\' 5"  (1.651 m)   Wt 292 lb 1.9 oz (132.5 kg)   SpO2 93%   BMI 48.61 kg/m?  ? ?Patient alert and oriented and in no cardiopulmonary distress. ? ?HEENT: No facial asymmetry, EOMI,     Neck supple . ? ?Chest: Clear to auscultation bilaterally.decreased air entry ? ?CVS: S1, S2 no murmurs, no S3.Regular rate. ?EKG: possible ST elevation a in anterolateral leads, no ;lVH, sinus brafy ?ABD: Soft non tender.  ? ?Ext: No edema ? ?MS: Adequate though reduced ROM spine, shoulders, hips and knees. ? ?Skin: Intact, no ulcerations or rash noted. ? ?Psych: Good eye contact, normal affect. Memory intact not anxious or depressed appearing. ? ?CNS: CN 2-12 intact, power,  normal throughout.no focal deficits noted. ? ? ?Assessment & Plan ? ?Essential  hypertension ?Uncontrolled , add amlodipine ?DASH diet and commitment to daily physical activity for a minimum of 30 minutes discussed and encouraged, as a part of hypertension management. ?The importance of attaining a healthy weight is also discussed. ? ? ?  10/14/2021  ?  3:51 PM 09/08/2020  ? 10:32 PM 09/08/2020  ?  9:00 PM 09/08/2020  ?  8:02 PM 09/08/2020  ?  7:51 PM 09/08/2020  ?  7:21 PM 09/08/2020  ?  7:20 PM  ?BP/Weight  ?Systolic BP [id number] 138  137  ?Diastolic BP 82 67 58 86 88  87  ?Wt. (Lbs) 292.12     290   ?BMI 48.61 kg/m2     49.78 kg/m2   ? ? ? ? ? ?Morbid obesity with BMI of 50.0-59.9, adult ? ?Patient re-educated about  the importance of commitment to a  minimum of 150 minutes of exercise per week as able. ? ?The importance of healthy food choices with portion control discussed, as well as eating regularly and within a 12 hour window most days. ?The need to choose "clean , green" food 50 to 75% of the time is discussed, as well as to make water the primary drink and set a goal of 64 ounces water daily. ? ?  ? ?  10/14/2021  ?  3:51 PM 09/08/2020  ?  7:21 PM 08/25/2020  ?  4:13 PM  ?Weight /BMI  ?Weight 292 lb 1.9 oz 290 lb 295 lb  ?Height 5\' 5"  (1.651 m) 5\' 4"  (1.626 m) 5\' 4"  (1.626  m)  ?BMI 48.61 kg/m2 49.78 kg/m2 50.64 kg/m2  ? ? ? ? ?Asthma, cough variant ?Uncontrolled add daily singulair ? ?Shortness of breath on exertion ?Increased , ekg and cardiologu eval ? ?

## 2021-10-18 ENCOUNTER — Encounter: Payer: Self-pay | Admitting: Family Medicine

## 2021-10-18 NOTE — Assessment & Plan Note (Signed)
Increased , ekg and cardiologu eval ?

## 2021-10-18 NOTE — Assessment & Plan Note (Signed)
Uncontrolled  add daily singulair 

## 2021-10-18 NOTE — Assessment & Plan Note (Signed)
Uncontrolled , add amlodipine ?DASH diet and commitment to daily physical activity for a minimum of 30 minutes discussed and encouraged, as a part of hypertension management. ?The importance of attaining a healthy weight is also discussed. ? ? ?  10/14/2021  ?  3:51 PM 09/08/2020  ? 10:32 PM 09/08/2020  ?  9:00 PM 09/08/2020  ?  8:02 PM 09/08/2020  ?  7:51 PM 09/08/2020  ?  7:21 PM 09/08/2020  ?  7:20 PM  ?BP/Weight  ?Systolic BP [id number] 138  137  ?Diastolic BP 82 67 58 86 88  87  ?Wt. (Lbs) 292.12     290   ?BMI 48.61 kg/m2     49.78 kg/m2   ? ? ? ? ?

## 2021-10-18 NOTE — Assessment & Plan Note (Signed)
?  Patient re-educated about  the importance of commitment to a  minimum of 150 minutes of exercise per week as able. ? ?The importance of healthy food choices with portion control discussed, as well as eating regularly and within a 12 hour window most days. ?The need to choose "clean , green" food 50 to 75% of the time is discussed, as well as to make water the primary drink and set a goal of 64 ounces water daily. ? ?  ? ?  10/14/2021  ?  3:51 PM 09/08/2020  ?  7:21 PM 08/25/2020  ?  4:13 PM  ?Weight /BMI  ?Weight 292 lb 1.9 oz 290 lb 295 lb  ?Height 5\' 5"  (1.651 m) 5\' 4"  (1.626 m) 5\' 4"  (1.626 m)  ?BMI 48.61 kg/m2 49.78 kg/m2 50.64 kg/m2  ? ? ? ?

## 2021-11-11 ENCOUNTER — Other Ambulatory Visit: Payer: Self-pay | Admitting: Family Medicine

## 2021-11-23 ENCOUNTER — Other Ambulatory Visit: Payer: Self-pay

## 2021-11-23 ENCOUNTER — Telehealth: Payer: Self-pay

## 2021-11-23 MED ORDER — UNABLE TO FIND: 0 refills | Status: DC

## 2021-11-23 NOTE — Telephone Encounter (Signed)
[facility]ubing rx [facility] to [facility]

## 2021-11-23 NOTE — Telephone Encounter (Signed)
Patient called needs the tubing for nebulizer need prescription sent to her pharmacy.  Pharmacy: [facility]

## 2021-12-09 ENCOUNTER — Encounter: Payer: BC Managed Care – PPO | Admitting: Family Medicine

## 2021-12-12 ENCOUNTER — Other Ambulatory Visit: Payer: Self-pay | Admitting: Family Medicine

## 2021-12-20 ENCOUNTER — Ambulatory Visit (HOSPITAL_COMMUNITY)
Admission: RE | Admit: 2021-12-20 | Discharge: 2021-12-20 | Disposition: A | Discharge status: Home / Self Care | Payer: BC Managed Care – PPO | Source: Ambulatory Visit | Attending: Family Medicine | Admitting: Family Medicine

## 2021-12-20 DIAGNOSIS — Z1231 Encounter for screening mammogram for malignant neoplasm of breast: Secondary | ICD-10-CM | POA: Insufficient documentation

## 2022-01-04 ENCOUNTER — Encounter: Payer: Self-pay | Admitting: Cardiology

## 2022-01-04 ENCOUNTER — Ambulatory Visit (INDEPENDENT_AMBULATORY_CARE_PROVIDER_SITE_OTHER): Payer: BC Managed Care – PPO | Admitting: Cardiology

## 2022-01-04 VITALS — BP 140/84 | HR 68 | Ht 64.0 in | Wt 287.0 lb

## 2022-01-04 DIAGNOSIS — R0602 Shortness of breath: Secondary | ICD-10-CM

## 2022-01-04 DIAGNOSIS — I10 Essential (primary) hypertension: Secondary | ICD-10-CM | POA: Diagnosis not present

## 2022-01-04 DIAGNOSIS — R011 Cardiac murmur, unspecified: Secondary | ICD-10-CM

## 2022-01-04 NOTE — Patient Instructions (Signed)
Medication Instructions:  Your physician recommends that you continue on your current medications as directed. Please refer to the Current Medication list given to you today.   Labwork: None today  Testing/Procedures: Your physician has requested that you have an echocardiogram. Echocardiography is a painless test that uses sound waves to create images of your heart. It provides your doctor with information about the size and shape of your heart and how well your heart's chambers and valves are working. This procedure takes approximately one hour. There are no restrictions for this procedure.   Follow-Up: We will call you with test results  Any Other Special Instructions Will Be Listed Below (If Applicable).  If you need a refill on your cardiac medications before your next appointment, please call your pharmacy.

## 2022-01-04 NOTE — Progress Notes (Signed)
Cardiology Office Note  Date: 01/04/2022   ID: Brandy Dean, DOB [birth year], MRN [id number]  PCP:  Kerri Perches, MD  Cardiologist:  Nona Dell, MD Electrophysiologist:  None   Chief Complaint  Patient presents with   Shortness of Breath    History of Present Illness: Brandy Dean is a 71 y.o. female referred for cardiology consultation by Dr. Lodema Hong for evaluation of shortness of breath.  She tells me that she was experiencing dyspnea on exertion, NYHA class II-III symptoms around the time that she saw Dr. Lodema Hong for routine visit in May.  Since that time however, symptoms have improved spontaneously.  She only mentions changing her diet, cutting out sweets and other carbohydrates.  She does not report any exertional chest pain, no palpitations or syncope.  Has not had any wheezing or cough.  She has undergone prior cardiac evaluation, last seen by Dr. Purvis Sheffield in 2019.  Lexiscan Myoview was normal at that time.  I do not see a prior echocardiogram.  She does have a heart murmur, reportedly longstanding.  I reviewed her medications.  She tells me that she was not able to tolerate Norvasc, started for additional blood pressure control by Dr. Lodema Hong.  Past Medical History:  Diagnosis Date   Bacterial pneumonia    March  9-12,  2011   Bronchial asthma    Essential hypertension    Morbid obesity (HCC)    Sleep apnea     Past Surgical History:  Procedure Laterality Date   CESAREAN SECTION  1988 & 1991   Partial left lobectomy  1968   RIght and left keloid ears  1968    Current Outpatient Medications  Medication Sig Dispense Refill   albuterol (ACCUNEB) 1.25 MG/3ML nebulizer solution INHALE THE CONTENTS OF 1 VIAL VIA NEBULIZER EVERY 6 HOURS AS NEEDED FOR WHEEZING 75 mL 5   albuterol (VENTOLIN HFA) 108 (90 Base) MCG/ACT inhaler INHALE 2 PUFFS INTO THE LUNGS EVERY 6 HOURS AS NEEDED FOR WHEEZING OR SHORTNESS OF BREATH 54 g 2   SYMBICORT 160-4.5 MCG/ACT  inhaler INHALE 2 PUFFS INTO THE LUNGS TWICE DAILY 10.2 g 2   triamterene-hydrochlorothiazide (MAXZIDE) 75-50 MG tablet TAKE 1 TABLET BY MOUTH DAILY 30 tablet 5   UNABLE TO FIND Neb machine mask and tubing x 1  Dx asthma 1 each 0   amLODipine (NORVASC) 5 MG tablet Take 1 tablet (5 mg total) by mouth daily. (Patient not taking: Reported on 01/04/2022) 30 tablet 3   HYDROcodone-acetaminophen (NORCO/VICODIN) 5-325 MG tablet Take 1 tablet by mouth every 6 (six) hours as needed. (Patient not taking: Reported on 01/04/2022) 20 tablet 0   montelukast (SINGULAIR) 10 MG tablet Take 1 tablet (10 mg total) by mouth at bedtime. (Patient not taking: Reported on 01/04/2022) 30 tablet 3   No current facility-administered medications for this visit.   Allergies:  Benzonatate, Penicillins, and Prenat-fecbn-febisg-fa-fishoil   Social History: The patient  reports that she quit smoking about 14 years ago. Her smoking use included cigarettes. She has never used smokeless tobacco. She reports that she does not drink alcohol and does not use drugs.   Family History: The patient's family history includes Esophageal cancer in her father; Hypertension in her mother; Obesity in her sister; Stroke in her mother.   ROS: No orthopnea or PND.  No syncope.  Physical Exam: VS:  BP 140/84   Pulse 68   Ht 5\' 4"  (1.626 m)   Wt 287 lb (130.2 kg)  SpO2 95%   BMI 49.26 kg/m , BMI Body mass index is 49.26 kg/m.  Wt Readings from Last 3 Encounters:  01/04/22 287 lb (130.2 kg)  10/14/21 292 lb 1.9 oz (132.5 kg)  09/08/20 290 lb (131.5 kg)    General: Patient appears comfortable at rest. HEENT: Conjunctiva and lids normal, oropharynx clear. Neck: Supple, no elevated JVP or carotid bruits, no thyromegaly. Lungs: Clear to auscultation, nonlabored breathing at rest. Cardiac: Regular rate and rhythm, no S3, 3/6 systolic murmur, no pericardial rub. Abdomen: Soft, bowel sounds present. Extremities: No pitting edema, distal  pulses 2+. Skin: Warm and dry. Musculoskeletal: No kyphosis. Neuropsychiatric: Alert and oriented x3, affect grossly appropriate.  ECG:  An ECG dated 10/14/2021 was personally reviewed today and demonstrated:  Sinus rhythm with RSR' in lead V1 and V2.  Recent Labwork: No results found for requested labs within last 365 days.     Component Value Date/Time   CHOL 248 (H) 11/20/2019 1457   TRIG 72 11/20/2019 1457   HDL 84 11/20/2019 1457   CHOLHDL 3.0 11/20/2019 1457   VLDL 12 05/30/2016 0832   LDLCALC 147 (H) 11/20/2019 1457    Other Studies Reviewed Today:  Eugenie Birks Myoview 10/03/2017: There was no ST segment deviation noted during stress. The left ventricular ejection fraction is hyperdynamic (>65%). The study is normal. There are no perfusion defects This is a low risk study.  Assessment and Plan:  1.  Dyspnea on exertion, improved at this point without specific intervention other than interval dietary changes.  She has lost about 5 pounds since May.  Blood pressure mildly elevated today, was higher in the past but she did not tolerate Norvasc.  No cough or wheezing.  She remains on Maxide.  Ischemic testing from 2019 was reassuring.  She has not had an echocardiogram and with longstanding cardiac murmur, this will be arranged.  2.  Essential hypertension, did not tolerate Norvasc, but is on Maxide and has follow-up with Dr. Lodema Hong later in the week.  Medication Adjustments/Labs and Tests Ordered: Current medicines are reviewed at length with the patient today.  Concerns regarding medicines are outlined above.   Tests Ordered: Orders Placed This Encounter  Procedures   ECHOCARDIOGRAM COMPLETE    Medication Changes: No orders of the defined types were placed in this encounter.   Disposition:  Follow up  test results.  Signed, Jonelle Sidle, MD, [facility] 01/04/2022 1:41 PM    [facility] [address] [place], [place]  [address] Phone: [phone]; Fax: [phone]

## 2022-01-05 LAB — LIPID PANEL
Chol/HDL Ratio: 3.5 ratio (ref 0.0–4.4)
Cholesterol, Total: 252 mg/dL — ABNORMAL HIGH (ref 100–199)
HDL: 71 mg/dL (ref >39)
LDL Chol Calc (NIH): 167 mg/dL — ABNORMAL HIGH (ref 0–99)
Triglycerides: 85 mg/dL (ref 0–149)
VLDL Cholesterol Cal: 14 mg/dL (ref 5–40)

## 2022-01-05 LAB — HEMOGLOBIN A1C
Est. average glucose Bld gHb Est-mCnc: 123 mg/dL
Hgb A1c MFr Bld: 5.9 % — ABNORMAL HIGH (ref 4.8–5.6)

## 2022-01-05 LAB — CBC
Hematocrit: 43.6 % (ref 34.0–46.6)
Hemoglobin: 13.3 g/dL (ref 11.1–15.9)
MCH: 25.2 pg — ABNORMAL LOW (ref 26.6–33.0)
MCHC: 30.5 g/dL — ABNORMAL LOW (ref 31.5–35.7)
MCV: 83 fL (ref 79–97)
Platelets: 223 10*3/uL (ref 150–450)
RBC: 5.27 x10E6/uL (ref 3.77–5.28)
RDW: 14.5 % (ref 11.7–15.4)
WBC: 8.7 10*3/uL (ref 3.4–10.8)

## 2022-01-05 LAB — VITAMIN D 25 HYDROXY (VIT D DEFICIENCY, FRACTURES): Vit D, 25-Hydroxy: 20.6 ng/mL — ABNORMAL LOW (ref 30.0–100.0)

## 2022-01-05 LAB — CMP14+EGFR
ALT: 9 IU/L (ref 0–32)
AST: 16 IU/L (ref 0–40)
Albumin/Globulin Ratio: 1.7 (ref 1.2–2.2)
Albumin: 4.5 g/dL (ref 3.9–4.9)
Alkaline Phosphatase: 64 IU/L (ref 44–121)
BUN/Creatinine Ratio: 25 (ref 12–28)
BUN: 20 mg/dL (ref 8–27)
Bilirubin Total: 0.7 mg/dL (ref 0.0–1.2)
CO2: 26 mmol/L (ref 20–29)
Calcium: 10.7 mg/dL — ABNORMAL HIGH (ref 8.7–10.3)
Chloride: 96 mmol/L (ref 96–106)
Creatinine, Ser: 0.8 mg/dL (ref 0.57–1.00)
Globulin, Total: 2.7 g/dL (ref 1.5–4.5)
Glucose: 83 mg/dL (ref 70–99)
Potassium: 4.1 mmol/L (ref 3.5–5.2)
Sodium: 137 mmol/L (ref 134–144)
Total Protein: 7.2 g/dL (ref 6.0–8.5)
eGFR: 80 mL/min/{1.73_m2} (ref >59)

## 2022-01-05 LAB — TSH: TSH: 1.43 u[IU]/mL (ref 0.450–4.500)

## 2022-01-07 ENCOUNTER — Ambulatory Visit (INDEPENDENT_AMBULATORY_CARE_PROVIDER_SITE_OTHER): Payer: BC Managed Care – PPO | Admitting: Family Medicine

## 2022-01-07 ENCOUNTER — Encounter: Payer: Self-pay | Admitting: Family Medicine

## 2022-01-07 VITALS — BP 134/78 | HR 70 | Ht 64.0 in | Wt 288.1 lb

## 2022-01-07 DIAGNOSIS — R011 Cardiac murmur, unspecified: Secondary | ICD-10-CM

## 2022-01-07 DIAGNOSIS — R7303 Prediabetes: Secondary | ICD-10-CM

## 2022-01-07 DIAGNOSIS — Z23 Encounter for immunization: Secondary | ICD-10-CM

## 2022-01-07 DIAGNOSIS — I10 Essential (primary) hypertension: Secondary | ICD-10-CM

## 2022-01-07 DIAGNOSIS — Z0001 Encounter for general adult medical examination with abnormal findings: Secondary | ICD-10-CM

## 2022-01-07 NOTE — Assessment & Plan Note (Signed)
Upcoming echo to further eval next week

## 2022-01-07 NOTE — Assessment & Plan Note (Signed)

## 2022-01-07 NOTE — Patient Instructions (Signed)
F/U mid Nov, shingrix #2 at visit, call if you need me before  Shingrix #1 today  Keep working on improved health  Nurse please follow up with pt request forrollator?? / assistive device while walking hat she can sit on, gets tired an d gives out of breath, [place] has them she stated  Cut back on nuts, they are high in cholesterol and calries  Increase vegetables  Non fasting HBA1C, chem 7 and EGFR, 3 to 5 days before next visit   Start vit D3 , one daily, 1000 iU this is oTC.   You are referred to Endo re high calcium  Keep appt for echo next week  Thanks for choosing [facility], we consider it a privelige to serve you.

## 2022-01-07 NOTE — Assessment & Plan Note (Signed)
Refer Endo for evaluation and management of peristent hypercalcemia

## 2022-01-07 NOTE — Progress Notes (Signed)
    Brandy Dean     MRN: [id number]      DOB: 01-06-1951  HPI: Patient is in for annual physical exam. No other health concerns are expressed or addressed at the visit. Recent labs,  are reviewed. Immunization is reviewed , and  updated if needed.   PE: BP 134/78   Pulse 70   Ht 5\' 4"  (1.626 m)   Wt 288 lb 1.9 oz (130.7 kg)   SpO2 92%   BMI 49.46 kg/m   Pleasant  female, alert and oriented x 3, in no cardio-pulmonary distress. Afebrile. HEENT No facial trauma or asymetry. Sinuses non tender.  Extra occullar muscles intact.. External ears normal, . Neck: supple, no adenopathy,JVD or thyromegaly.No bruits.  Chest: Clear to ascultation bilaterally.No crackles or wheezes. Non tender to palpation   Cardiovascular system; Heart sounds normal,  S1 and  S2 ,no S3.  Systolic  murmur, no thrill.  Peripheral pulses normal.  Abdomen: Soft, non tender,     Musculoskeletal exam: Adequate though reduced  ROM of spine, hips , shoulders and knees. deformity ,swelling or crepitus noted. No muscle wasting or atrophy.   Neurologic: Cranial nerves 2 to 12 intact. Power, tone ,sensation and reflexes normal throughout. No disturbance in gait. No tremor.  Skin: Intact, no ulceration, erythema , scaling or rash noted. Pigmentation normal throughout  Psych; Normal mood and affect. Judgement and concentration normal   Assessment & Plan:  Annual visit for general adult medical examination with abnormal findings Annual exam as documented. Counseling done  re healthy lifestyle involving commitment to 150 minutes exercise per week, heart healthy diet, and attaining healthy weight.The importance of adequate sleep also discussed. Regular seat belt use and home safety, is also discussed. Changes in health habits are decided on by the patient with goals and time frames  set for achieving them. Immunization and cancer screening needs are specifically addressed at this  visit.   Hypercalcemia Refer Endo for evaluation and management of peristent hypercalcemia  Heart murmur Upcoming echo to further eval next week

## 2022-01-12 ENCOUNTER — Ambulatory Visit (HOSPITAL_COMMUNITY)
Admission: RE | Admit: 2022-01-12 | Discharge: 2022-01-12 | Disposition: A | Discharge status: Home / Self Care | Payer: BC Managed Care – PPO | Source: Ambulatory Visit | Attending: Cardiology | Admitting: Cardiology

## 2022-01-12 DIAGNOSIS — R0602 Shortness of breath: Secondary | ICD-10-CM | POA: Insufficient documentation

## 2022-01-12 LAB — ECHOCARDIOGRAM COMPLETE
AR max vel: 1.78 cm2
AV Area VTI: 1.9 cm2
AV Area mean vel: 1.84 cm2
AV Mean grad: 9 mmHg
AV Peak grad: 18.9 mmHg
Ao pk vel: 2.17 m/s
Area-P 1/2: 2.17 cm2
S' Lateral: 2.8 cm

## 2022-01-12 NOTE — Progress Notes (Signed)
*  PRELIMINARY RESULTS* Echocardiogram 2D Echocardiogram has been performed.  Brandy Dean 01/12/2022, 3:02 PM

## 2022-02-17 ENCOUNTER — Ambulatory Visit: Payer: Self-pay | Admitting: "Endocrinology

## 2022-03-14 ENCOUNTER — Other Ambulatory Visit: Payer: Self-pay

## 2022-03-14 ENCOUNTER — Telehealth: Payer: Self-pay | Admitting: Family Medicine

## 2022-03-14 ENCOUNTER — Other Ambulatory Visit: Payer: Self-pay | Admitting: Family Medicine

## 2022-03-14 DIAGNOSIS — B369 Superficial mycosis, unspecified: Secondary | ICD-10-CM

## 2022-03-14 MED ORDER — CLOTRIMAZOLE-BETAMETHASONE 1-0.05 % EX CREA: TOPICAL_CREAM | CUTANEOUS | 1 refills | Status: DC

## 2022-03-14 NOTE — Telephone Encounter (Signed)
Refill sent.

## 2022-03-14 NOTE — Telephone Encounter (Signed)
Patient wants refill on    clotrimazole-betamethasone (LOTRISONE) cream

## 2022-03-24 ENCOUNTER — Other Ambulatory Visit: Payer: Self-pay | Admitting: Family Medicine

## 2022-03-30 ENCOUNTER — Other Ambulatory Visit: Payer: Self-pay | Admitting: Family Medicine

## 2022-03-30 DIAGNOSIS — I10 Essential (primary) hypertension: Secondary | ICD-10-CM

## 2022-04-07 ENCOUNTER — Ambulatory Visit: Payer: BC Managed Care – PPO | Admitting: "Endocrinology

## 2022-04-07 ENCOUNTER — Encounter: Payer: Self-pay | Admitting: "Endocrinology

## 2022-04-07 DIAGNOSIS — R7303 Prediabetes: Secondary | ICD-10-CM

## 2022-04-07 DIAGNOSIS — E782 Mixed hyperlipidemia: Secondary | ICD-10-CM | POA: Diagnosis not present

## 2022-04-07 MED ORDER — ROSUVASTATIN CALCIUM 20 MG PO TABS: 20.0000 mg | ORAL_TABLET | Freq: Every day | ORAL | 1 refills | Status: DC

## 2022-04-07 NOTE — Progress Notes (Signed)
Endocrinology Consult Note       04/07/2022, 7:22 PM  Brandy Dean is a 71 y.o.-year-old female, referred by her  Fayrene Helper, MD  , for evaluation for hypercalcemia/hyperparathyroidism.   Past Medical History:  Diagnosis Date   Bacterial pneumonia    March  9-12,  2011   Bronchial asthma    Essential hypertension    Hyperlipidemia    Morbid obesity ([place])    Sleep apnea     Past Surgical History:  Procedure Laterality Date   CESAREAN SECTION  1988 & 1991   Partial left lobectomy  1968   RIght and left keloid ears  1968    Social History   Tobacco Use   Smoking status: Former    Types: Cigarettes    Quit date: 06/14/2007    Years since quitting: 14.8   Smokeless tobacco: Never  Vaping Use   Vaping Use: Never used  Substance Use Topics   Alcohol use: Yes    Comment: moderately   Drug use: No    Family History  Problem Relation Age of Onset   Stroke Mother    Hypertension Mother    Esophageal cancer Father    Obesity Sister     Outpatient Encounter Medications as of 04/07/2022  Medication Sig   Cholecalciferol (VITAMIN D3 PO) Take 1 tablet by mouth daily.   rosuvastatin (CRESTOR) 20 MG tablet Take 1 tablet (20 mg total) by mouth daily.   albuterol (ACCUNEB) 1.25 MG/3ML nebulizer solution INHALE THE CONTENTS OF 1 VIAL VIA NEBULIZER EVERY 6 HOURS AS NEEDED FOR WHEEZING   albuterol (VENTOLIN HFA) 108 (90 Base) MCG/ACT inhaler INHALE 2 PUFFS INTO THE LUNGS EVERY 6 HOURS AS NEEDED FOR WHEEZING OR SHORTNESS OF BREATH   amLODipine (NORVASC) 5 MG tablet Take 1 tablet (5 mg total) by mouth daily. (Patient not taking: Reported on 01/04/2022)   clotrimazole-betamethasone (LOTRISONE) cream APPLY TOPICALLY TO THE AFFECTED AREA TWICE DAILY   montelukast (SINGULAIR) 10 MG tablet Take 1 tablet (10 mg total) by mouth at bedtime. (Patient not taking: Reported on 01/04/2022)   SYMBICORT 160-4.5 MCG/ACT inhaler  INHALE 2 PUFFS INTO THE LUNGS TWICE DAILY   triamterene-hydrochlorothiazide (MAXZIDE) 75-50 MG tablet TAKE 1 TABLET BY MOUTH DAILY   UNABLE TO FIND Neb machine mask and tubing x 1  Dx asthma   No facility-administered encounter medications on file as of 04/07/2022.    Allergies  Allergen Reactions   Benzonatate Shortness Of Breath    TRIGGERS ASTHMA ATTACK   Penicillins Rash   Prenat-Fecbn-Febisg-Fa-Fishoil Swelling     HPI  Brandy Dean was diagnosed with hypercalcemia on several occasions.  She has no previously known history of parathyroid, pituitary, adrenal dysfunctions; no family history of such dysfunctions.  -Review of herreferral package of most recent labs reveals calcium of 10.7 without corresponding PTH.  She is known to have vitamin D deficiency.   She is currently on over-the-counter low-dose vitamin D supplement. She did have a remote past bone density in 2017 which was normal.  No recent bone density.   No prior history of fragility fractures or falls. No history of  kidney stones. She has normal renal function.  she is not on HCTZ or other thiazide therapy.  she is not on calcium supplements,  she eats dairy and green, leafy, vegetables on average amounts.  she does not have a family history of hypercalcemia, pituitary tumors, thyroid cancer, or osteoporosis.  She has severe hyperlipidemia, not on treatment. He is a former smoker, with COPD currently on bronchodilators.    ROS:  Constitutional: + 20 body weight, recently lost 12 pounds unintentionally.    No weight gain/loss, no fatigue, no subjective hyperthermia, no subjective hypothermia Eyes: no blurry vision, no xerophthalmia ENT: no sore throat, no nodules palpated in throat, no dysphagia/odynophagia, no hoarseness Cardiovascular: no Chest Pain, no Shortness of Breath, no palpitations, no leg swelling Respiratory: + cough, +shortness of breath  Gastrointestinal: no  Nausea/Vomiting/Diarhhea Musculoskeletal: no muscle/joint aches Skin: no rashes Neurological: no tremors, no numbness, no tingling, no dizziness Psychiatric: no depression, no anxiety  PE: BP 128/78   Pulse 72   Ht 5\' 4"  (1.626 m)   Wt 276 lb 3.2 oz (125.3 kg)   BMI 47.41 kg/m , Body mass index is 47.41 kg/m. Wt Readings from Last 3 Encounters:  04/07/22 276 lb 3.2 oz (125.3 kg)  01/07/22 288 lb 1.9 oz (130.7 kg)  01/04/22 287 lb (130.2 kg)    Constitutional: + BMI of 47.41, not in acute distress, normal state of mind Eyes: PERRLA, EOMI, no exophthalmos ENT: moist mucous membranes, no gross thyromegaly, no gross cervical lymphadenopathy Cardiovascular: normal precordial activity, Regular Rate and Rhythm, no Murmur/Rubs/Gallops Respiratory:  adequate breathing efforts, no gross chest deformity, + diffuse wheezes on bilateral lung fields  Gastrointestinal: abdomen soft, Non -tender, No distension, Bowel Sounds present Musculoskeletal: no gross deformities, strength intact in all four extremities Skin: moist, warm, no rashes Neurological: no tremor with outstretched hands, Deep tendon reflexes normal in bilateral lower extremities.     CMP ( most recent) CMP     Component Value Date/Time   NA 137 01/04/2022 1356   K 4.1 01/04/2022 1356   CL 96 01/04/2022 1356   CO2 26 01/04/2022 1356   GLUCOSE 83 01/04/2022 1356   GLUCOSE 90 11/20/2019 1457   BUN 20 01/04/2022 1356   CREATININE 0.80 01/04/2022 1356   CREATININE 0.81 11/20/2019 1457   CALCIUM 10.7 (H) 01/04/2022 1356   PROT 7.2 01/04/2022 1356   ALBUMIN 4.5 01/04/2022 1356   AST 16 01/04/2022 1356   ALT 9 01/04/2022 1356   ALKPHOS 64 01/04/2022 1356   BILITOT 0.7 01/04/2022 1356   GFRNONAA 75 11/20/2019 1457   GFRAA 87 11/20/2019 1457     Diabetic Labs (most recent): Lab Results  Component Value Date   HGBA1C 5.9 (H) 01/04/2022   HGBA1C 5.4 11/20/2019   HGBA1C 5.7 (H) 12/07/2017     Lipid Panel ( most  recent) Lipid Panel     Component Value Date/Time   CHOL 252 (H) 01/04/2022 1356   TRIG 85 01/04/2022 1356   HDL 71 01/04/2022 1356   CHOLHDL 3.5 01/04/2022 1356   CHOLHDL 3.0 11/20/2019 1457   VLDL 12 05/30/2016 0832   LDLCALC 167 (H) 01/04/2022 1356   LDLCALC 147 (H) 11/20/2019 1457   LABVLDL 14 01/04/2022 1356          Assessment: 1. Hypercalcemia / Hyperparathyroidism 2.  Hyperlipidemia 3.  Prediabetes 4.  Morbid obesity  Plan: Patient has had at least 3 instances of elevated calcium, however no corresponding PTH.    -  Patient also  has vitamin D deficiency, with history of allergy to fish oil.  She is advised to increase her current vitamin D to 2 capsules daily-for 2000 units daily. A diagnosis of hyperparathyroidism is not made.  She will need further studies including 24-hour urine calcium measurement, repeat labs for PTH/calcium, magnesium, phosphorus. She will also need repeat bone density including distal one third of radius. - I discussed with the patient about the physiology of calcium and parathyroid hormone, and possible  effects of  increased PTH/ Calcium , including kidney stones, cardiac dysrhythmias, osteoporosis, abdominal pain, etc.   - The work up so far is not sufficient to reach a conclusion for definitive therapy.  she  needs more studies to confirm and classify the parathyroid dysfunction she may have.   It is also essential to obtain 24-hour urine calcium/creatinine to rule out the rare but important cause of mild elevation in calcium and PTH- FHH ( Familial Hypocalciuric Hypercalcemia), which may not require any active intervention.  - I will request for her next DEXA scan to include the distal  33% of  radius for evaluation of cortical bone, which is predominantly affected by hyperparathyroidism.   Major health concern seems to be primary cognitive dysfunction indicated by severe dyslipidemia, prediabetes, morbid obesity.  She would benefit from  lifestyle medicine.  - she acknowledges that there is a room for improvement in her food and drink choices. - Suggestion is made for her to avoid simple carbohydrates  from her diet including Cakes, Sweet Desserts, Ice Cream, Soda (diet and regular), Sweet Tea, Candies, Chips, Cookies, Store Bought Juices, Alcohol , Artificial Sweeteners,  Coffee Creamer, and "Sugar-free" Products, Lemonade. This will help patient to have more stable blood glucose profile and potentially avoid unintended weight gain.  The following Lifestyle Medicine recommendations according to American College of Lifestyle Medicine  [facility]) were discussed and and offered to patient and she  agrees to start the journey:  A. Whole Foods, Plant-Based Nutrition comprising of fruits and vegetables, plant-based proteins, whole-grain carbohydrates was discussed in detail with the patient.   A list for source of those nutrients were also provided to the patient.  Patient will use only water or unsweetened tea for hydration. B.  The need to stay away from risky substances including alcohol, smoking; obtaining 7 to 9 hours of restorative sleep, at least 150 minutes of moderate intensity exercise weekly, the importance of healthy social connections,  and stress management techniques were discussed. C.  A full color page of  Calorie density of various food groups per pound showing examples of each food groups was provided to the patient.  She is also given a prescription for Crestor 20 mg p.o. nightly.  Side effects and precautions discussed with her.  If her next labs are not conclusive, she will be considered for PTH related peptide.  She is advised to maintain close follow-up with her PMD. - Time spent with the patient: 60 minutes, of which >50% was spent in obtaining information about her symptoms, reviewing her previous labs, evaluations, and treatments, counseling her about her hyperglycemia, hyperlipidemia, morbid obesity, prediabetes,  and developing a plan to confirm the diagnosis and long term treatment as necessary.  Please refer to " Patient Self Inventory" in the Media  tab for reviewed elements of pertinent patient history.  Thyra Breed participated in the discussions, expressed understanding, and voiced agreement with the above plans.  All questions were answered to her satisfaction. she is  encouraged to contact clinic should she have any questions or concerns prior to her return visit.  - Return in about 4 weeks (around 05/05/2022) for F/U with Pre-visit Labs, DXA Scan B4 NV, 24 Hr Urine Ca & Cr.   Marquis Lunch, MD [facility] [address] [place], [place] [address] Phone: [phone]  Fax: [phone]    This note was partially dictated with voice recognition software. Similar sounding words can be transcribed inadequately or may not  be corrected upon review.  04/07/2022, 7:22 PM

## 2022-04-07 NOTE — Patient Instructions (Signed)
                                     Advice for Weight Management  -For most of us the best way to lose weight is by diet management. Generally speaking, diet management means consuming less calories intentionally which over time brings about progressive weight loss.  This can be achieved more effectively by avoiding ultra processed carbohydrates, processed meats, unhealthy fats.    It is critically important to know your numbers: how much calorie you are consuming and how much calorie you need. More importantly, our carbohydrates sources should be unprocessed naturally occurring  complex starch food items.  It is always important to balance nutrition also by  appropriate intake of proteins (mainly plant-based), healthy fats/oils, plenty of fruits and vegetables.   -The American College of Lifestyle Medicine (ACL M) recommends nutrition derived mostly from Whole Food, Plant Predominant Sources example an apple instead of applesauce or apple pie. Eat Plenty of vegetables, Mushrooms, fruits, Legumes, Whole Grains, Nuts, seeds in lieu of processed meats, processed snacks/pastries red meat, poultry, eggs.  Use only water or unsweetened tea for hydration.  The College also recommends the need to stay away from risky substances including alcohol, smoking; obtaining 7-9 hours of restorative sleep, at least 150 minutes of moderate intensity exercise weekly, importance of healthy social connections, and being mindful of stress and seek help when it is overwhelming.    -Sticking to a routine mealtime to eat 3 meals a day and avoiding unnecessary snacks is shown to have a big role in weight control. Under normal circumstances, the only time we burn stored energy is when we are hungry, so allow  some hunger to take place- hunger means no food between appropriate meal times, only water.  It is not advisable to starve.   -It is better to avoid simple carbohydrates including:  Cakes, Sweet Desserts, Ice Cream, Soda (diet and regular), Sweet Tea, Candies, Chips, Cookies, Store Bought Juices, Alcohol in Excess of  1-2 drinks a day, Lemonade,  Artificial Sweeteners, Doughnuts, Coffee Creamers, "Sugar-free" Products, etc, etc.  This is not a complete list.....    -Consulting with certified diabetes educators is proven to provide you with the most accurate and current information on diet.  Also, you may be  interested in discussing diet options/exchanges , we can schedule a visit with Brandy Dean, RDN, CDE for individualized nutrition education.  -Exercise: If you are able: 30 -60 minutes a day ,4 days a week, or 150 minutes of moderate intensity exercise weekly.    The longer the better if tolerated.  Combine stretch, strength, and aerobic activities.  If you were told in the past that you have high risk for cardiovascular diseases, or if you are currently symptomatic, you may seek evaluation by your heart doctor prior to initiating moderate to intense exercise programs.                                  Additional Care Considerations for Diabetes/Prediabetes   -Diabetes  is a chronic disease.  The most important care consideration is regular follow-up with your diabetes care provider with the goal being avoiding or delaying its complications and to take advantage of advances in medications and technology.  If appropriate actions are taken early enough, type 2 diabetes can even be   reversed.  Seek information from the right source.  - Whole Food, Plant Predominant Nutrition is highly recommended: Eat Plenty of vegetables, Mushrooms, fruits, Legumes, Whole Grains, Nuts, seeds in lieu of processed meats, processed snacks/pastries red meat, poultry, eggs as recommended by American College of  Lifestyle Medicine (ACLM).  -Type 2 diabetes is known to coexist with other important comorbidities such as high blood pressure and high cholesterol.  It is critical to control not only the  diabetes but also the high blood pressure and high cholesterol to minimize and delay the risk of complications including coronary artery disease, stroke, amputations, blindness, etc.  The good news is that this diet recommendation for type 2 diabetes is also very helpful for managing high cholesterol and high blood blood pressure.  - Studies showed that people with diabetes will benefit from a class of medications known as ACE inhibitors and statins.  Unless there are specific reasons not to be on these medications, the standard of care is to consider getting one from these groups of medications at an optimal doses.  These medications are generally considered safe and proven to help protect the heart and the kidneys.    - People with diabetes are encouraged to initiate and maintain regular follow-up with eye doctors, foot doctors, dentists , and if necessary heart and kidney doctors.     - It is highly recommended that people with diabetes quit smoking or stay away from smoking, and get yearly  flu vaccine and pneumonia vaccine at least every 5 years.  See above for additional recommendations on exercise, sleep, stress management , and healthy social connections.      

## 2022-04-08 ENCOUNTER — Other Ambulatory Visit: Payer: Self-pay | Admitting: Family Medicine

## 2022-04-08 DIAGNOSIS — B369 Superficial mycosis, unspecified: Secondary | ICD-10-CM

## 2022-04-29 ENCOUNTER — Encounter: Payer: Self-pay | Admitting: Family Medicine

## 2022-04-29 ENCOUNTER — Ambulatory Visit (INDEPENDENT_AMBULATORY_CARE_PROVIDER_SITE_OTHER): Payer: BC Managed Care – PPO | Admitting: Family Medicine

## 2022-04-29 ENCOUNTER — Other Ambulatory Visit: Payer: Self-pay

## 2022-04-29 VITALS — BP 128/82 | HR 84 | Ht 64.0 in | Wt 271.0 lb

## 2022-04-29 DIAGNOSIS — J45991 Cough variant asthma: Secondary | ICD-10-CM | POA: Diagnosis not present

## 2022-04-29 DIAGNOSIS — I10 Essential (primary) hypertension: Secondary | ICD-10-CM

## 2022-04-29 DIAGNOSIS — Z23 Encounter for immunization: Secondary | ICD-10-CM

## 2022-04-29 DIAGNOSIS — E782 Mixed hyperlipidemia: Secondary | ICD-10-CM

## 2022-04-29 DIAGNOSIS — R7303 Prediabetes: Secondary | ICD-10-CM

## 2022-04-29 NOTE — Patient Instructions (Signed)
F/U in 4.5 months, call if you need me sooner  Shingrix #2 today  Nurse visit for Flu vaccine in 2 weeks  Continue healthy food choice and portion control   Fasting lipid, cmp and EGFR and HBA1C 5  to 7days before appt  Please do submit stool for cologuard tes  Thanks for choosing [facility], we consider it a privelige to serve you.

## 2022-05-01 ENCOUNTER — Encounter: Payer: Self-pay | Admitting: Family Medicine

## 2022-05-01 NOTE — Progress Notes (Signed)
Brandy Dean     MRN: [id number]      DOB: [birth year]   HPI Brandy Dean is here for follow up and re-evaluation of chronic medical conditions, medication management and review of any available recent lab and radiology data.  Preventive health is updated, specifically  Cancer screening and Immunization.   Questions or concerns regarding consultations or procedures which the PT has had in the interim are  addressed. The PT denies any adverse reactions to current medications since the last visit.  There are no new concerns.  There are no specific complaints   ROS Denies recent fever or chills. Denies sinus pressure, nasal congestion, ear pain or sore throat. Denies chest congestion, productive cough or wheezing. Denies chest pains, palpitations and leg swelling Denies abdominal pain, nausea, vomiting,diarrhea or constipation.   Denies dysuria, frequency, hesitancy or incontinence. Denies joint pain, swelling and limitation in mobility. Denies headaches, seizures, numbness, or tingling. Denies depression, anxiety or insomnia. Denies skin break down or rash.   PE  BP 128/82   Pulse 84   Ht 5\' 4"  (1.626 m)   Wt 271 lb (122.9 kg)   SpO2 95%   BMI 46.52 kg/m   Patient alert and oriented and in no cardiopulmonary distress.  HEENT: No facial asymmetry, EOMI,     Neck supple .  Chest: Clear to auscultation bilaterally.  CVS: S1, S2 no murmurs, no S3.Regular rate.  ABD: Soft non tender.   Ext: No edema  MS: Adequate ROM spine, shoulders, hips and knees.  Skin: Intact, no ulcerations or rash noted.  Psych: Good eye contact, normal affect. Memory intact not anxious or depressed appearing.  CNS: CN 2-12 intact, power,  normal throughout.no focal deficits noted.   Assessment & Plan  Essential hypertension Controlled, no change in medication DASH diet and commitment to daily physical activity for a minimum of 30 minutes discussed and encouraged, as a part of hypertension  management. The importance of attaining a healthy weight is also discussed.     04/29/2022    4:47 PM 04/29/2022    4:36 PM 04/07/2022    2:52 PM 01/07/2022    1:45 PM 01/07/2022    1:08 PM 01/04/2022    1:11 PM 10/14/2021    3:51 PM  BP/Weight  Systolic BP 0000000 A999333 0000000 Q000111Q [id number] A999333  Diastolic BP 82 81 78 78 76 84 82  Wt. (Lbs)  271 276.2  288.12 287 292.12  BMI  46.52 kg/m2 47.41 kg/m2  49.46 kg/m2 49.26 kg/m2 48.61 kg/m2       Asthma, cough variant Controlled, no change in medication   Mixed hyperlipidemia Hyperlipidemia:Low fat diet discussed and encouraged.   Lipid Panel  Lab Results  Component Value Date   CHOL 252 (H) 01/04/2022   HDL 71 01/04/2022   LDLCALC 167 (H) 01/04/2022   TRIG 85 01/04/2022   CHOLHDL 3.5 01/04/2022     Updated lab needed at/ before next visit.   Morbid obesity ([place])  Patient re-educated about  the importance of commitment to a  minimum of 150 minutes of exercise per week as able.  The importance of healthy food choices with portion control discussed, as well as eating regularly and within a 12 hour window most days. The need to choose "clean , green" food 50 to 75% of the time is discussed, as well as to make water the primary drink and set a goal of 64 ounces water daily.  04/29/2022    4:36 PM 04/07/2022    2:52 PM 01/07/2022    1:08 PM  Weight /BMI  Weight 271 lb 276 lb 3.2 oz 288 lb 1.9 oz  Height 5\' 4"  (1.626 m) 5\' 4"  (1.626 m) 5\' 4"  (1.626 m)  BMI 46.52 kg/m2 47.41 kg/m2 49.46 kg/m2    Improving, encouraged to continue same  Prediabetes Patient educated about the importance of limiting  Carbohydrate intake , the need to commit to daily physical activity for a minimum of 30 minutes , and to commit weight loss. The fact that changes in all these areas will reduce or eliminate all together the development of diabetes is stressed.      Latest Ref Rng & Units 01/04/2022    1:56 PM 11/20/2019    2:57 PM 03/21/2019     4:15 PM 12/07/2017    8:53 AM 04/20/2017    8:28 AM  Diabetic Labs  HbA1c 4.8 - 5.6 % 5.9  5.4   5.7  5.7   Chol 100 - 199 mg/dL 05/21/2019  12/09/2017  13/01/2017  250  539   HDL >39 mg/dL 71  84  72  47  76   Calc LDL 0 - 99 mg/dL 767  341  937  902  409   Triglycerides 0 - 149 mg/dL 85  72  83  735  76   Creatinine 0.57 - 1.00 mg/dL 329  924  268  3.41  0.71       04/29/2022    4:47 PM 04/29/2022    4:36 PM 04/07/2022    2:52 PM 01/07/2022    1:45 PM 01/07/2022    1:08 PM 01/04/2022    1:11 PM 10/14/2021    3:51 PM  BP/Weight  Systolic BP 128 135 128 134 134 140 157  Diastolic BP 82 81 78 78 76 84 82  Wt. (Lbs)  271 276.2  288.12 287 292.12  BMI  46.52 kg/m2 47.41 kg/m2  49.46 kg/m2 49.26 kg/m2 48.61 kg/m2       No data to display          Updated lab needed at/ before next visit.   Hypercalcemia Being managed by Endo has upcoming appt

## 2022-05-01 NOTE — Assessment & Plan Note (Signed)
Patient educated about the importance of limiting  Carbohydrate intake , the need to commit to daily physical activity for a minimum of 30 minutes , and to commit weight loss. The fact that changes in all these areas will reduce or eliminate all together the development of diabetes is stressed.      Latest Ref Rng & Units 01/04/2022    1:56 PM 11/20/2019    2:57 PM 03/21/2019    4:15 PM 12/07/2017    8:53 AM 04/20/2017    8:28 AM  Diabetic Labs  HbA1c 4.8 - 5.6 % 5.9  5.4   5.7  5.7   Chol 100 - 199 mg/dL 132  440  102  725  366   HDL >39 mg/dL 71  84  72  47  76   Calc LDL 0 - 99 mg/dL 440  347  425  956  387   Triglycerides 0 - 149 mg/dL 85  72  83  564  76   Creatinine 0.57 - 1.00 mg/dL 3.32  9.51  8.84  1.66  0.71       04/29/2022    4:47 PM 04/29/2022    4:36 PM 04/07/2022    2:52 PM 01/07/2022    1:45 PM 01/07/2022    1:08 PM 01/04/2022    1:11 PM 10/14/2021    3:51 PM  BP/Weight  Systolic BP 128 135 128 134 134 140 157  Diastolic BP 82 81 78 78 76 84 82  Wt. (Lbs)  271 276.2  288.12 287 292.12  BMI  46.52 kg/m2 47.41 kg/m2  49.46 kg/m2 49.26 kg/m2 48.61 kg/m2       No data to display          Updated lab needed at/ before next visit.

## 2022-05-01 NOTE — Assessment & Plan Note (Signed)
Controlled, no change in medication DASH diet and commitment to daily physical activity for a minimum of 30 minutes discussed and encouraged, as a part of hypertension management. The importance of attaining a healthy weight is also discussed.     04/29/2022    4:47 PM 04/29/2022    4:36 PM 04/07/2022    2:52 PM 01/07/2022    1:45 PM 01/07/2022    1:08 PM 01/04/2022    1:11 PM 10/14/2021    3:51 PM  BP/Weight  Systolic BP 128 135 128 134 134 140 157  Diastolic BP 82 81 78 78 76 84 82  Wt. (Lbs)  271 276.2  288.12 287 292.12  BMI  46.52 kg/m2 47.41 kg/m2  49.46 kg/m2 49.26 kg/m2 48.61 kg/m2

## 2022-05-01 NOTE — Assessment & Plan Note (Signed)
Hyperlipidemia:Low fat diet discussed and encouraged.   Lipid Panel  Lab Results  Component Value Date   CHOL 252 (H) 01/04/2022   HDL 71 01/04/2022   LDLCALC 167 (H) 01/04/2022   TRIG 85 01/04/2022   CHOLHDL 3.5 01/04/2022     Updated lab needed at/ before next visit.

## 2022-05-01 NOTE — Assessment & Plan Note (Signed)
Being managed by Endo has upcoming appt

## 2022-05-01 NOTE — Assessment & Plan Note (Signed)
Controlled, no change in medication  

## 2022-05-01 NOTE — Assessment & Plan Note (Signed)
  Patient re-educated about  the importance of commitment to a  minimum of 150 minutes of exercise per week as able.  The importance of healthy food choices with portion control discussed, as well as eating regularly and within a 12 hour window most days. The need to choose "clean , green" food 50 to 75% of the time is discussed, as well as to make water the primary drink and set a goal of 64 ounces water daily.       04/29/2022    4:36 PM 04/07/2022    2:52 PM 01/07/2022    1:08 PM  Weight /BMI  Weight 271 lb 276 lb 3.2 oz 288 lb 1.9 oz  Height 5\' 4"  (1.626 m) 5\' 4"  (1.626 m) 5\' 4"  (1.626 m)  BMI 46.52 kg/m2 47.41 kg/m2 49.46 kg/m2    Improving, encouraged to continue same

## 2022-05-10 ENCOUNTER — Ambulatory Visit: Payer: Medicare Other | Admitting: "Endocrinology

## 2022-05-24 ENCOUNTER — Ambulatory Visit: Payer: Medicare Other | Admitting: "Endocrinology

## 2022-06-21 ENCOUNTER — Telehealth: Payer: Self-pay | Admitting: "Endocrinology

## 2022-06-21 NOTE — Telephone Encounter (Signed)
Pt needs a DEXA. Please fax to [facility].

## 2022-06-22 ENCOUNTER — Telehealth: Payer: Self-pay

## 2022-06-22 ENCOUNTER — Ambulatory Visit: Payer: Medicare Other | Admitting: "Endocrinology

## 2022-06-22 NOTE — Telephone Encounter (Signed)
Bone Density order sent to [facility] radiology for scheduling

## 2022-06-22 NOTE — Telephone Encounter (Signed)
Will fax order for DXA scan to [facility].

## 2022-06-23 ENCOUNTER — Encounter: Payer: Self-pay | Admitting: "Endocrinology

## 2022-08-01 ENCOUNTER — Other Ambulatory Visit: Payer: Self-pay | Admitting: Family Medicine

## 2022-08-01 MED ORDER — FLUTICASONE FUROATE-VILANTEROL 100-25 MCG/ACT IN AEPB: 1.0000 | INHALATION_SPRAY | Freq: Every day | RESPIRATORY_TRACT | 11 refills | Status: DC

## 2022-08-14 ENCOUNTER — Encounter (HOSPITAL_COMMUNITY): Payer: Self-pay

## 2022-08-14 ENCOUNTER — Other Ambulatory Visit: Payer: Self-pay

## 2022-08-14 ENCOUNTER — Emergency Department (HOSPITAL_COMMUNITY)
Admission: EM | Admit: 2022-08-14 | Discharge: 2022-08-14 | Disposition: A | Discharge status: Home / Self Care | Payer: BC Managed Care – PPO | Attending: Emergency Medicine | Admitting: Emergency Medicine

## 2022-08-14 DIAGNOSIS — H8110 Benign paroxysmal vertigo, unspecified ear: Secondary | ICD-10-CM | POA: Insufficient documentation

## 2022-08-14 DIAGNOSIS — J45909 Unspecified asthma, uncomplicated: Secondary | ICD-10-CM | POA: Diagnosis not present

## 2022-08-14 DIAGNOSIS — H5509 Other forms of nystagmus: Secondary | ICD-10-CM | POA: Diagnosis not present

## 2022-08-14 DIAGNOSIS — I10 Essential (primary) hypertension: Secondary | ICD-10-CM | POA: Diagnosis not present

## 2022-08-14 DIAGNOSIS — Z7951 Long term (current) use of inhaled steroids: Secondary | ICD-10-CM | POA: Insufficient documentation

## 2022-08-14 DIAGNOSIS — R42 Dizziness and giddiness: Secondary | ICD-10-CM | POA: Diagnosis present

## 2022-08-14 MED ORDER — MECLIZINE HCL 25 MG PO TABS: 25.0000 mg | ORAL_TABLET | Freq: Three times a day (TID) | ORAL | 0 refills | Status: DC | PRN

## 2022-08-14 MED ORDER — MECLIZINE HCL 12.5 MG PO TABS
25.0000 mg | ORAL_TABLET | Freq: Once | ORAL | Status: AC
  Administered 2022-08-14: 25 mg via ORAL
  Filled 2022-08-14: qty 2

## 2022-08-14 NOTE — ED Provider Notes (Signed)
[facility] Provider Note   CSN: [id number] Arrival date & time: 08/14/22  1247     History  Chief Complaint  Patient presents with   Dizziness    Brandy Dean is a 72 y.o. female.  Patient presents emergency department complaining of dizziness described as the room spinning which has been intermittent over the past week.  She states it is worse with positional changes.  She states that standing up causes her to feel the room is spinning and she must sit down or grab something she feels that she will fall.  She denies headaches, nausea, vomiting, chest pain, shortness of breath, fevers.  She denies previous episodes of this.  Past medical history significant for asthma, sleep apnea, obesity, hypertension  HPI     Home Medications Prior to Admission medications   Medication Sig Start Date End Date Taking? Authorizing Provider  meclizine (ANTIVERT) 25 MG tablet Take 1 tablet (25 mg total) by mouth 3 (three) times daily as needed for dizziness. 08/14/22  Yes Cherlynn June B, PA-C  albuterol (ACCUNEB) 1.25 MG/3ML nebulizer solution INHALE THE CONTENTS OF 1 VIAL VIA NEBULIZER EVERY 6 HOURS AS NEEDED FOR WHEEZING 10/11/21   Fayrene Helper, MD  albuterol (VENTOLIN HFA) 108 (90 Base) MCG/ACT inhaler INHALE 2 PUFFS INTO THE LUNGS EVERY 6 HOURS AS NEEDED FOR WHEEZING OR SHORTNESS OF BREATH 11/11/21   Fayrene Helper, MD  Cholecalciferol (VITAMIN D3 PO) Take 1 tablet by mouth daily.    [provider]  clotrimazole-betamethasone (LOTRISONE) cream APPLY TOPICALLY TO THE AFFECTED AREA TWICE DAILY 04/08/22   Fayrene Helper, MD  fluticasone furoate-vilanterol (BREO ELLIPTA) 100-25 MCG/ACT AEPB Inhale 1 puff into the lungs daily. 08/01/22   Fayrene Helper, MD  montelukast (SINGULAIR) 10 MG tablet Take 1 tablet (10 mg total) by mouth at bedtime. Patient not taking: Reported on 01/04/2022 10/14/21   Fayrene Helper, MD   triamterene-hydrochlorothiazide [facility]) 75-50 MG tablet TAKE 1 TABLET BY MOUTH DAILY 03/30/22   Fayrene Helper, MD  UNABLE TO FIND Neb machine mask and tubing x 1  Dx asthma 11/23/21   Fayrene Helper, MD      Allergies    Benzonatate, Penicillins, and Prenat-fecbn-febisg-fa-fishoil    Review of Systems   Review of Systems  Constitutional:  Negative for fever.  HENT:  Negative for congestion and rhinorrhea.   Respiratory:  Negative for shortness of breath.   Cardiovascular:  Negative for chest pain.  Gastrointestinal:  Negative for abdominal pain, nausea and vomiting.  Genitourinary:  Negative for dysuria.  Neurological:  Positive for dizziness.    Physical Exam Updated Vital Signs BP (!) 144/93   Pulse 74   Resp 20   Ht '5\' 4"'$  (1.626 m)   Wt 125.9 kg   SpO2 96%   BMI 47.65 kg/m  Physical Exam Vitals and nursing note reviewed.  Constitutional:      General: She is not in acute distress.    Appearance: She is well-developed.  HENT:     Head: Normocephalic and atraumatic.  Eyes:     Conjunctiva/sclera: Conjunctivae normal.  Cardiovascular:     Rate and Rhythm: Normal rate and regular rhythm.     Heart sounds: No murmur heard. Pulmonary:     Effort: Pulmonary effort is normal. No respiratory distress.     Breath sounds: Normal breath sounds.  Abdominal:     Palpations: Abdomen is soft.  Tenderness: There is no abdominal tenderness.  Musculoskeletal:        General: No swelling.     Cervical back: Neck supple.  Skin:    General: Skin is warm and dry.     Capillary Refill: Capillary refill takes less than 2 seconds.  Neurological:     Mental Status: She is alert.     Comments: Hints exam with horizontal nystagmus  Psychiatric:        Mood and Affect: Mood normal.     ED Results / Procedures / Treatments   Labs (all labs ordered are listed, but only abnormal results are displayed) Labs Reviewed - No data to display  EKG Dean  Radiology No  results found.  Procedures Procedures    Medications Ordered in ED Medications  meclizine (ANTIVERT) tablet 25 mg (25 mg Oral Given 08/14/22 1325)    ED Course/ Medical Decision Making/ A&P                             Medical Decision Making  This patient presents to the ED for concern of dizziness, this involves an extensive number of treatment options, and is a complaint that carries with it a high risk of complications and morbidity.  The differential diagnosis includes peripheral vertigo including BPPV, central vertigo, CVA, arrhythmia, others   Co morbidities that complicate the patient evaluation  Asthma, hypertension, obesity   Additional history obtained:  No recent relevant medical records available for review.   Cardiac Monitoring: / EKG:  The patient was maintained on a cardiac monitor.  I personally viewed and interpreted the cardiac monitored which showed an underlying rhythm of: Sinus rhythm   Problem List / ED Course / Critical interventions / Medication management   I ordered medication including meclizine for dizziness Reevaluation of the patient after these medicines showed that the patient improved I have reviewed the patients home medicines and have made adjustments as needed    Test / Admission - Considered:  The patient symptoms are consistent with BPPV.  Her symptoms improved with meclizine.  Plan to discharge patient home with prescription for meclizine and recommendations for follow-up with her primary care provider.  No focal deficits to suggest stroke.  No headache to suggest occipital stroke.  No dysrhythmia on EKG.  Return precautions provided.        Final Clinical Impression(s) / ED Diagnoses Final diagnoses:  Benign paroxysmal positional vertigo, unspecified laterality    Rx / DC Orders ED Discharge Orders          Ordered    meclizine (ANTIVERT) 25 MG tablet  3 times daily PRN        08/14/22 1424               Ronny Bacon 08/14/22 1425    Fredia Sorrow, MD 08/15/22 1849

## 2022-08-14 NOTE — ED Triage Notes (Signed)
States she gets dizzy every time she changes position.  States it started over a week ago.  Denies recent illness or congestion.

## 2022-08-14 NOTE — ED Notes (Signed)
Pt reports no dizziness upon standing. She has no complaints at this time.

## 2022-08-14 NOTE — Discharge Instructions (Signed)
You were evaluated today and diagnosed with vertigo.  Please take the prescribed medication as directed.  Please follow-up with your primary care provider.  I have provided printed instructions on how to perform maneuver at home which may relieve some of your symptoms.  If you develop any life-threatening symptoms such as sudden onset severe headache, please return to the emergency department for reevaluation

## 2022-08-15 ENCOUNTER — Telehealth: Payer: Self-pay

## 2022-08-15 NOTE — Transitions of Care (Post Inpatient/ED Visit) (Signed)
   08/15/2022  Name: Brandy Dean MRN: [id number] DOB: October 09, 1950  Today's TOC FU Call Status: Today's TOC FU Call Status:: Successful TOC FU Call Competed TOC FU Call Complete Date: 08/15/22  Transition Care Management Follow-up Telephone Call Date of Discharge: 08/14/22 Discharge [facility]: [facility] Type of Discharge: Emergency Department Reason for ED Visit: Other: (Benign paroxysmal positional vertigo, unspecified laterality) How have you been since you were released from the hospital?: Better Any questions or concerns?: No  Items Reviewed: Did you receive and understand the discharge instructions provided?: Yes Medications obtained and verified?: Yes (Medications Reviewed) Any new allergies since your discharge?: No Dietary orders reviewed?: NA Do you have support at home?: Yes People in Home: significant other  Home Care and Equipment/Supplies: [place] Ordered?: No Any new equipment or medical supplies ordered?: No  Functional Questionnaire: Do you need assistance with bathing/showering or dressing?: No Do you need assistance with meal preparation?: No Do you need assistance with eating?: No Do you have difficulty maintaining continence: No Do you need assistance with getting out of bed/getting out of a chair/moving?: No Do you have difficulty managing or taking your medications?: No  Folllow up appointments reviewed: PCP Follow-up appointment confirmed?: No (pt needs fu appt by 08-28-22- no available appts- will ask front desk to call pt and schedule fu appt) MD Provider Line Number:[phone] Given: Yes Follow-up Provider: [facility] Follow-up appointment confirmed?: No Do you need transportation to your follow-up appointment?: No Do you understand care options if your condition(s) worsen?: Yes-patient verbalized understanding    [place] LPN [place] Direct Dial [phone]

## 2022-08-29 ENCOUNTER — Other Ambulatory Visit: Payer: Self-pay | Admitting: Family Medicine

## 2022-09-09 ENCOUNTER — Ambulatory Visit: Payer: BC Managed Care – PPO | Admitting: Family Medicine

## 2022-09-13 ENCOUNTER — Ambulatory Visit: Payer: BC Managed Care – PPO | Admitting: Family Medicine

## 2022-09-14 ENCOUNTER — Other Ambulatory Visit (HOSPITAL_COMMUNITY): Payer: BC Managed Care – PPO

## 2022-09-14 ENCOUNTER — Encounter: Payer: Self-pay | Admitting: Family Medicine

## 2022-09-18 ENCOUNTER — Other Ambulatory Visit: Payer: Self-pay | Admitting: Family Medicine

## 2022-09-18 DIAGNOSIS — I10 Essential (primary) hypertension: Secondary | ICD-10-CM

## 2022-09-18 MED ORDER — TRIAMTERENE-HCTZ 75-50 MG PO TABS: 1.0000 | ORAL_TABLET | Freq: Every day | ORAL | 1 refills | Status: DC

## 2022-09-18 MED ORDER — ALBUTEROL SULFATE HFA 108 (90 BASE) MCG/ACT IN AERS: 2.0000 | INHALATION_SPRAY | Freq: Four times a day (QID) | RESPIRATORY_TRACT | 2 refills | Status: DC | PRN

## 2022-09-29 ENCOUNTER — Telehealth: Payer: Self-pay | Admitting: Family Medicine

## 2022-09-29 ENCOUNTER — Other Ambulatory Visit: Payer: Self-pay | Admitting: Family Medicine

## 2022-09-29 DIAGNOSIS — I10 Essential (primary) hypertension: Secondary | ICD-10-CM

## 2022-09-29 DIAGNOSIS — B369 Superficial mycosis, unspecified: Secondary | ICD-10-CM

## 2022-09-29 NOTE — Telephone Encounter (Signed)
Patient called need med refillss  clotrimazole-betamethasone (LOTRISONE) cream [[id number]   triamterene-hydrochlorothiazide (MAXZIDE) 75-50 MG tablet [[id number]]   [facility] [address] [place]

## 2022-09-29 NOTE — Telephone Encounter (Signed)
Refills sent to pharmacy. 

## 2022-09-29 NOTE — Telephone Encounter (Signed)
Patient called She needs an inhaler that does not make her cough.  This medicine fluticasone furoate-vilanterol (BREO ELLIPTA) 100-25 MCG/ACT AEPB [[id number]] makes her cough what else can she take call back # [phone]  Pharmacy: [facility] [address] [place]

## 2022-09-30 ENCOUNTER — Other Ambulatory Visit: Payer: Self-pay | Admitting: Family Medicine

## 2022-09-30 DIAGNOSIS — I10 Essential (primary) hypertension: Secondary | ICD-10-CM

## 2022-09-30 DIAGNOSIS — R7303 Prediabetes: Secondary | ICD-10-CM

## 2022-09-30 DIAGNOSIS — E782 Mixed hyperlipidemia: Secondary | ICD-10-CM

## 2022-09-30 DIAGNOSIS — E559 Vitamin D deficiency, unspecified: Secondary | ICD-10-CM

## 2022-09-30 MED ORDER — BUDESONIDE-FORMOTEROL FUMARATE 80-4.5 MCG/ACT IN AERO: 2.0000 | INHALATION_SPRAY | Freq: Two times a day (BID) | RESPIRATORY_TRACT | 3 refills | Status: DC

## 2022-09-30 NOTE — Telephone Encounter (Signed)
Labs ordered.

## 2022-10-01 ENCOUNTER — Other Ambulatory Visit: Payer: Self-pay | Admitting: Family Medicine

## 2022-10-01 DIAGNOSIS — I10 Essential (primary) hypertension: Secondary | ICD-10-CM

## 2022-10-03 ENCOUNTER — Other Ambulatory Visit: Payer: Self-pay | Admitting: Family Medicine

## 2022-10-03 ENCOUNTER — Telehealth: Payer: Self-pay | Admitting: Family Medicine

## 2022-10-03 NOTE — Telephone Encounter (Signed)
Patient called need med refill, patient has ran out needs ASAP   budesonide-formoterol [facility] 80-4.5 MCG/ACT inhaler [[id number]   Pharmacy: [facility] [address]

## 2022-10-03 NOTE — Telephone Encounter (Signed)
Refills sent to pharmacy on 09/30/2022

## 2022-10-04 ENCOUNTER — Other Ambulatory Visit: Payer: Self-pay

## 2022-10-04 MED ORDER — BUDESONIDE-FORMOTEROL FUMARATE 80-4.5 MCG/ACT IN AERO: 2.0000 | INHALATION_SPRAY | Freq: Two times a day (BID) | RESPIRATORY_TRACT | 3 refills | Status: DC

## 2022-10-04 NOTE — Telephone Encounter (Signed)
Please send to [facility] [address], was sent to the wrong pharmacy before.

## 2022-10-04 NOTE — Telephone Encounter (Signed)
Refills sent to correct pharmacy

## 2022-10-07 ENCOUNTER — Ambulatory Visit: Payer: BC Managed Care – PPO | Admitting: Family Medicine

## 2022-10-21 ENCOUNTER — Other Ambulatory Visit (HOSPITAL_COMMUNITY): Payer: Self-pay | Admitting: "Endocrinology

## 2022-10-21 DIAGNOSIS — Z780 Asymptomatic menopausal state: Secondary | ICD-10-CM

## 2022-10-21 DIAGNOSIS — R7303 Prediabetes: Secondary | ICD-10-CM

## 2022-10-21 DIAGNOSIS — E782 Mixed hyperlipidemia: Secondary | ICD-10-CM

## 2022-10-21 LAB — MAGNESIUM: Magnesium: 2 mg/dL (ref 1.6–2.3)

## 2022-10-21 LAB — PTH, INTACT AND CALCIUM
Calcium: 10.5 mg/dL — ABNORMAL HIGH (ref 8.7–10.3)
PTH: 71 pg/mL — ABNORMAL HIGH (ref 15–65)

## 2022-10-21 LAB — PHOSPHORUS: Phosphorus: 2.9 mg/dL — ABNORMAL LOW (ref 3.0–4.3)

## 2022-10-21 LAB — VITAMIN D 25 HYDROXY (VIT D DEFICIENCY, FRACTURES): Vit D, 25-Hydroxy: 24.8 ng/mL — ABNORMAL LOW (ref 30.0–100.0)

## 2022-10-25 LAB — CREATININE, URINE, 24 HOUR
Creatinine, 24H Ur: 928 mg/24 hr (ref 800–1800)
Creatinine, Urine: 62.9 mg/dL

## 2022-10-26 LAB — CALCIUM, URINE, 24 HOUR
Calcium, 24H Urine: 65 mg/24 hr (ref 0–320)
Calcium, Urine: 4.4 mg/dL

## 2022-10-28 ENCOUNTER — Ambulatory Visit (HOSPITAL_COMMUNITY)
Admission: RE | Admit: 2022-10-28 | Discharge: 2022-10-28 | Disposition: A | Discharge status: Home / Self Care | Payer: BC Managed Care – PPO | Source: Ambulatory Visit | Attending: "Endocrinology | Admitting: "Endocrinology

## 2022-10-28 DIAGNOSIS — Z780 Asymptomatic menopausal state: Secondary | ICD-10-CM

## 2022-10-31 ENCOUNTER — Other Ambulatory Visit (HOSPITAL_COMMUNITY): Payer: BC Managed Care – PPO

## 2022-11-04 ENCOUNTER — Encounter: Payer: Self-pay | Admitting: Family Medicine

## 2022-11-04 ENCOUNTER — Ambulatory Visit: Payer: BC Managed Care – PPO | Admitting: Family Medicine

## 2022-11-04 ENCOUNTER — Telehealth: Payer: Self-pay | Admitting: Family Medicine

## 2022-11-04 VITALS — BP 136/82 | HR 78 | Ht 64.0 in | Wt 263.0 lb

## 2022-11-04 DIAGNOSIS — I10 Essential (primary) hypertension: Secondary | ICD-10-CM

## 2022-11-04 DIAGNOSIS — E782 Mixed hyperlipidemia: Secondary | ICD-10-CM

## 2022-11-04 DIAGNOSIS — R011 Cardiac murmur, unspecified: Secondary | ICD-10-CM

## 2022-11-04 DIAGNOSIS — E785 Hyperlipidemia, unspecified: Secondary | ICD-10-CM

## 2022-11-04 DIAGNOSIS — J45991 Cough variant asthma: Secondary | ICD-10-CM | POA: Diagnosis not present

## 2022-11-04 DIAGNOSIS — R7303 Prediabetes: Secondary | ICD-10-CM

## 2022-11-04 MED ORDER — BREZTRI AEROSPHERE 160-9-4.8 MCG/ACT IN AERO: 2.0000 | INHALATION_SPRAY | Freq: Two times a day (BID) | RESPIRATORY_TRACT | 11 refills | Status: DC

## 2022-11-04 NOTE — Patient Instructions (Addendum)
Annual exam end September, call if you need me sooner  Please schedule mammogram due July 12 or after at checkout  Fasting lipid, cmp and EGFr, hBA1C, TS,H and CBC June 7 or shortly after  Call back if inhaler not covered and please check your insurance for preferred medication  Please send in cologuard asap  Thanks for choosing [facility], we consider it a privelige to serve you.

## 2022-11-04 NOTE — Telephone Encounter (Signed)
Patient advised.

## 2022-11-04 NOTE — Telephone Encounter (Signed)
Patient called 05.23.2024 at 4:23 pm. Patient was at the [facility] on [address] [place] and needs a smaller dosage on inhaler cost is $77.00 and can not afford it. Needs something her insurance will cover.  Patient call back # [phone].

## 2022-11-05 ENCOUNTER — Encounter: Payer: Self-pay | Admitting: Family Medicine

## 2022-11-05 NOTE — Assessment & Plan Note (Signed)
Improved, she is congratulated on this  Patient re-educated about  the importance of commitment to a  minimum of 150 minutes of exercise per week as able.  The importance of healthy food choices with portion control discussed, as well as eating regularly and within a 12 hour window most days. The need to choose "clean , green" food 50 to 75% of the time is discussed, as well as to make water the primary drink and set a goal of 64 ounces water daily.       11/04/2022    4:22 PM 08/14/2022   12:56 PM 04/29/2022    4:36 PM  Weight /BMI  Weight 263 lb 277 lb 9.6 oz 271 lb  Height 5\' 4"  (1.626 m) 5\' 4"  (1.626 m) 5\' 4"  (1.626 m)  BMI 45.14 kg/m2 47.65 kg/m2 46.52 kg/m2

## 2022-11-05 NOTE — Progress Notes (Signed)
Brandy Dean     MRN: [id number]      DOB: 10-Sep-1950  Chief Complaint  Patient presents with   Hypertension   Hyperlipidemia    Patient is here for HTN and lipid f/u. Has no changes or concerns since last visit.     HPI [name] is here for follow up and re-evaluation of chronic medical conditions, medication management and review of any available recent lab and radiology data.  Preventive health is updated, specifically  Cancer screening and Immunization.   Has been having hypercalcemia evaluated and is still in the process The PT denies any adverse reactions to current medications since the last visit.  Unable to afford symbicort, which she states works well, looking for alternative inhaler which is delivered in powder form Call made to Pharmacy.  They recommended Breztri and after sent they will be able to determine cost, they recommend pt call her ins for preferred as it varies so much with different plans  ROS Denies recent fever or chills. Denies sinus pressure, nasal congestion, ear pain or sore throat. Denies chest congestion, productive cough c/o intermittent  wheezing.trying to get an affordable maintainace inhaler Denies chest pains, palpitations and leg swelling Denies abdominal pain, nausea, vomiting,diarrhea or constipation.   Denies dysuria, frequency, hesitancy or incontinence. Denies joint pain, swelling and limitation in mobility. Denies headaches, seizures, numbness, or tingling. Denies depression, anxiety or insomnia. Denies skin break down or rash.   PE  BP 136/82   Pulse 78   Ht 5\' 4"  (1.626 m)   Wt 263 lb (119.3 kg)   SpO2 (!) 88%   BMI 45.14 kg/m   Patient alert and oriented and in no cardiopulmonary distress.  HEENT: No facial asymmetry, EOMI,     Neck supple .  Chest: Clear to auscultation bilaterally.  CVS: S1, S2  murmur, no S3.Regular rate ABD: Soft non tender.   Ext: No edema  MS: Adequate ROM spine, shoulders, hips and  knees.  Skin: Intact, no ulcerations or rash noted.  Psych: Good eye contact, normal affect. Memory intact not anxious or depressed appearing.  CNS: CN 2-12 intact, power,  normal throughout.no focal deficits noted.   Assessment & Plan  Essential hypertension Controlled, no change in medication DASH diet and commitment to daily physical activity for a minimum of 30 minutes discussed and encouraged, as a part of hypertension management. The importance of attaining a healthy weight is also discussed.     11/04/2022    4:50 PM 11/04/2022    4:22 PM 08/14/2022    2:54 PM 08/14/2022    1:00 PM 08/14/2022   12:56 PM 04/29/2022    4:47 PM 04/29/2022    4:36 PM  BP/Weight  Systolic BP 136 124 140 144  128 135  Diastolic BP 82 74 90 93  82 81  Wt. (Lbs)  263   277.6  271  BMI  45.14 kg/m2   47.65 kg/m2  46.52 kg/m2       Asthma, cough variant Needs maintainace inhaler which is on her plan, she will call her ins if Markus Daft is not covered Not as well controlled as desired  Mixed hyperlipidemia Hyperlipidemia:Low fat diet discussed and encouraged.   Lipid Panel  Lab Results  Component Value Date   CHOL 252 (H) 01/04/2022   HDL 71 01/04/2022   LDLCALC 167 (H) 01/04/2022   TRIG 85 01/04/2022   CHOLHDL 3.5 01/04/2022     Updated lab needed at/ before next  visit.  Morbid obesity (HCC) Improved, she is congratulated on this  Patient re-educated about  the importance of commitment to a  minimum of 150 minutes of exercise per week as able.  The importance of healthy food choices with portion control discussed, as well as eating regularly and within a 12 hour window most days. The need to choose "clean , green" food 50 to 75% of the time is discussed, as well as to make water the primary drink and set a goal of 64 ounces water daily.       11/04/2022    4:22 PM 08/14/2022   12:56 PM 04/29/2022    4:36 PM  Weight /BMI  Weight 263 lb 277 lb 9.6 oz 271 lb  Height 5\' 4"  (1.626 m)  5\' 4"  (1.626 m) 5\' 4"  (1.626 m)  BMI 45.14 kg/m2 47.65 kg/m2 46.52 kg/m2      Prediabetes Patient educated about the importance of limiting  Carbohydrate intake , the need to commit to daily physical activity for a minimum of 30 minutes , and to commit weight loss. The fact that changes in all these areas will reduce or eliminate all together the development of diabetes is stressed.      Latest Ref Rng & Units 01/04/2022    1:56 PM 11/20/2019    2:57 PM 03/21/2019    4:15 PM 12/07/2017    8:53 AM 04/20/2017    8:28 AM  Diabetic Labs  HbA1c 4.8 - 5.6 % 5.9  5.4   5.7  5.7   Chol 100 - 199 mg/dL 130  865  784  696  295   HDL >39 mg/dL 71  84  72  47  76   Calc LDL 0 - 99 mg/dL 284  132  440  102  725   Triglycerides 0 - 149 mg/dL 85  72  83  366  76   Creatinine 0.57 - 1.00 mg/dL 4.40  3.47  4.25  9.56  0.71       11/04/2022    4:50 PM 11/04/2022    4:22 PM 08/14/2022    2:54 PM 08/14/2022    1:00 PM 08/14/2022   12:56 PM 04/29/2022    4:47 PM 04/29/2022    4:36 PM  BP/Weight  Systolic BP 136 124 140 144  128 135  Diastolic BP 82 74 90 93  82 81  Wt. (Lbs)  263   277.6  271  BMI  45.14 kg/m2   47.65 kg/m2  46.52 kg/m2       No data to display         Updated lab needed at/ before next visit.   Hypercalcemia Endo evaluating this

## 2022-11-05 NOTE — Assessment & Plan Note (Signed)
Patient educated about the importance of limiting  Carbohydrate intake , the need to commit to daily physical activity for a minimum of 30 minutes , and to commit weight loss. The fact that changes in all these areas will reduce or eliminate all together the development of diabetes is stressed.      Latest Ref Rng & Units 01/04/2022    1:56 PM 11/20/2019    2:57 PM 03/21/2019    4:15 PM 12/07/2017    8:53 AM 04/20/2017    8:28 AM  Diabetic Labs  HbA1c 4.8 - 5.6 % 5.9  5.4   5.7  5.7   Chol 100 - 199 mg/dL 956  387  564  332  951   HDL >39 mg/dL 71  84  72  47  76   Calc LDL 0 - 99 mg/dL 884  166  063  016  010   Triglycerides 0 - 149 mg/dL 85  72  83  932  76   Creatinine 0.57 - 1.00 mg/dL 3.55  7.32  2.02  5.42  0.71       11/04/2022    4:50 PM 11/04/2022    4:22 PM 08/14/2022    2:54 PM 08/14/2022    1:00 PM 08/14/2022   12:56 PM 04/29/2022    4:47 PM 04/29/2022    4:36 PM  BP/Weight  Systolic BP 136 124 140 144  128 135  Diastolic BP 82 74 90 93  82 81  Wt. (Lbs)  263   277.6  271  BMI  45.14 kg/m2   47.65 kg/m2  46.52 kg/m2       No data to display         Updated lab needed at/ before next visit.

## 2022-11-05 NOTE — Assessment & Plan Note (Signed)
Needs maintainace inhaler which is on her plan, she will call her ins if Markus Daft is not covered Not as well controlled as desired

## 2022-11-05 NOTE — Assessment & Plan Note (Signed)
Controlled, no change in medication DASH diet and commitment to daily physical activity for a minimum of 30 minutes discussed and encouraged, as a part of hypertension management. The importance of attaining a healthy weight is also discussed.     11/04/2022    4:50 PM 11/04/2022    4:22 PM 08/14/2022    2:54 PM 08/14/2022    1:00 PM 08/14/2022   12:56 PM 04/29/2022    4:47 PM 04/29/2022    4:36 PM  BP/Weight  Systolic BP 136 124 140 144  128 135  Diastolic BP 82 74 90 93  82 81  Wt. (Lbs)  263   277.6  271  BMI  45.14 kg/m2   47.65 kg/m2  46.52 kg/m2

## 2022-11-05 NOTE — Assessment & Plan Note (Signed)
Hyperlipidemia:Low fat diet discussed and encouraged.   Lipid Panel  Lab Results  Component Value Date   CHOL 252 (H) 01/04/2022   HDL 71 01/04/2022   LDLCALC 167 (H) 01/04/2022   TRIG 85 01/04/2022   CHOLHDL 3.5 01/04/2022     Updated lab needed at/ before next visit.  

## 2022-11-05 NOTE — Assessment & Plan Note (Signed)
Endo evaluating this

## 2022-11-10 ENCOUNTER — Other Ambulatory Visit: Payer: Self-pay | Admitting: Family Medicine

## 2022-11-10 ENCOUNTER — Telehealth: Payer: Self-pay | Admitting: Family Medicine

## 2022-11-10 DIAGNOSIS — Z1231 Encounter for screening mammogram for malignant neoplasm of breast: Secondary | ICD-10-CM

## 2022-11-10 NOTE — Telephone Encounter (Signed)
Got pt scheduled for appt. Thank you.

## 2022-11-10 NOTE — Telephone Encounter (Signed)
Mammogram order placed

## 2022-11-10 NOTE — Telephone Encounter (Signed)
Pt stated she needs an order for a mammogram, to get an appt scheduled.

## 2022-11-26 LAB — CBC WITH DIFFERENTIAL/PLATELET
Basophils Absolute: 0 10*3/uL (ref 0.0–0.2)
Basos: 0 %
EOS (ABSOLUTE): 0.2 10*3/uL (ref 0.0–0.4)
Eos: 2 %
Hematocrit: 41.3 % (ref 34.0–46.6)
Hemoglobin: 13.1 g/dL (ref 11.1–15.9)
Immature Grans (Abs): 0 10*3/uL (ref 0.0–0.1)
Immature Granulocytes: 0 %
Lymphocytes Absolute: 1.8 10*3/uL (ref 0.7–3.1)
Lymphs: 27 %
MCH: 26.7 pg (ref 26.6–33.0)
MCHC: 31.7 g/dL (ref 31.5–35.7)
MCV: 84 fL (ref 79–97)
Monocytes Absolute: 0.6 10*3/uL (ref 0.1–0.9)
Monocytes: 8 %
Neutrophils Absolute: 4.1 10*3/uL (ref 1.4–7.0)
Neutrophils: 63 %
Platelets: 230 10*3/uL (ref 150–450)
RBC: 4.91 x10E6/uL (ref 3.77–5.28)
RDW: 13.8 % (ref 11.7–15.4)
WBC: 6.7 10*3/uL (ref 3.4–10.8)

## 2022-11-26 LAB — LIPID PANEL
Chol/HDL Ratio: 3 ratio (ref 0.0–4.4)
Cholesterol, Total: 240 mg/dL — ABNORMAL HIGH (ref 100–199)
HDL: 80 mg/dL (ref >39)
LDL Chol Calc (NIH): 150 mg/dL — ABNORMAL HIGH (ref 0–99)
Triglycerides: 59 mg/dL (ref 0–149)
VLDL Cholesterol Cal: 10 mg/dL (ref 5–40)

## 2022-11-26 LAB — CMP14+EGFR
ALT: 10 IU/L (ref 0–32)
AST: 15 IU/L (ref 0–40)
Albumin/Globulin Ratio: 1.6
Albumin: 4.3 g/dL (ref 3.9–4.9)
Alkaline Phosphatase: 65 IU/L (ref 44–121)
BUN/Creatinine Ratio: 20 (ref 12–28)
BUN: 16 mg/dL (ref 8–27)
Bilirubin Total: 0.6 mg/dL (ref 0.0–1.2)
CO2: 28 mmol/L (ref 20–29)
Calcium: 10.8 mg/dL — ABNORMAL HIGH (ref 8.7–10.3)
Chloride: 99 mmol/L (ref 96–106)
Creatinine, Ser: 0.82 mg/dL (ref 0.57–1.00)
Globulin, Total: 2.7 g/dL (ref 1.5–4.5)
Glucose: 87 mg/dL (ref 70–99)
Potassium: 4.4 mmol/L (ref 3.5–5.2)
Sodium: 141 mmol/L (ref 134–144)
Total Protein: 7 g/dL (ref 6.0–8.5)
eGFR: 77 mL/min/{1.73_m2} (ref >59)

## 2022-11-26 LAB — HEMOGLOBIN A1C
Est. average glucose Bld gHb Est-mCnc: 120 mg/dL
Hgb A1c MFr Bld: 5.8 % — ABNORMAL HIGH (ref 4.8–5.6)

## 2022-11-26 LAB — VITAMIN D 25 HYDROXY (VIT D DEFICIENCY, FRACTURES): Vit D, 25-Hydroxy: 24.2 ng/mL — ABNORMAL LOW (ref 30.0–100.0)

## 2022-11-26 LAB — TSH: TSH: 1.37 u[IU]/mL (ref 0.450–4.500)

## 2022-11-30 ENCOUNTER — Other Ambulatory Visit: Payer: Self-pay | Admitting: Family Medicine

## 2022-11-30 DIAGNOSIS — I10 Essential (primary) hypertension: Secondary | ICD-10-CM

## 2022-12-14 ENCOUNTER — Other Ambulatory Visit: Payer: Self-pay | Admitting: Family Medicine

## 2022-12-14 DIAGNOSIS — J45991 Cough variant asthma: Secondary | ICD-10-CM

## 2022-12-20 ENCOUNTER — Telehealth: Payer: Self-pay | Admitting: Family Medicine

## 2022-12-20 NOTE — Telephone Encounter (Signed)
Pt called in requesting AIRSUPRA INHALER   Wants sent in to [facility] on [address]

## 2022-12-26 ENCOUNTER — Ambulatory Visit (HOSPITAL_COMMUNITY)
Admission: RE | Admit: 2022-12-26 | Discharge: 2022-12-26 | Disposition: A | Discharge status: Home / Self Care | Payer: BC Managed Care – PPO | Source: Ambulatory Visit | Attending: Family Medicine | Admitting: Family Medicine

## 2022-12-26 DIAGNOSIS — Z1231 Encounter for screening mammogram for malignant neoplasm of breast: Secondary | ICD-10-CM | POA: Insufficient documentation

## 2022-12-28 ENCOUNTER — Other Ambulatory Visit: Payer: Self-pay

## 2022-12-28 ENCOUNTER — Telehealth: Payer: Self-pay | Admitting: Family Medicine

## 2022-12-28 MED ORDER — ALBUTEROL SULFATE HFA 108 (90 BASE) MCG/ACT IN AERS: 2.0000 | INHALATION_SPRAY | Freq: Four times a day (QID) | RESPIRATORY_TRACT | 2 refills | Status: DC | PRN

## 2022-12-28 NOTE — Telephone Encounter (Signed)
Patient called need her prevental inhaler *asupra* pharmacy [facility] [address]. Please contact patient to confirm correct medication for patient. [phone] about her asthma attacks if flares up

## 2022-12-28 NOTE — Telephone Encounter (Signed)
See other msg

## 2022-12-28 NOTE — Telephone Encounter (Signed)
Refill sent on albuterol inhaler

## 2023-02-15 ENCOUNTER — Ambulatory Visit (INDEPENDENT_AMBULATORY_CARE_PROVIDER_SITE_OTHER): Payer: BC Managed Care – PPO | Admitting: Family Medicine

## 2023-02-15 ENCOUNTER — Encounter: Payer: Self-pay | Admitting: Family Medicine

## 2023-02-15 VITALS — BP 133/77 | HR 59 | Ht 64.0 in | Wt 250.0 lb

## 2023-02-15 DIAGNOSIS — Z0001 Encounter for general adult medical examination with abnormal findings: Secondary | ICD-10-CM

## 2023-02-15 DIAGNOSIS — E785 Hyperlipidemia, unspecified: Secondary | ICD-10-CM

## 2023-02-15 DIAGNOSIS — R7303 Prediabetes: Secondary | ICD-10-CM | POA: Diagnosis not present

## 2023-02-15 DIAGNOSIS — I10 Essential (primary) hypertension: Secondary | ICD-10-CM | POA: Diagnosis not present

## 2023-02-15 DIAGNOSIS — J45991 Cough variant asthma: Secondary | ICD-10-CM

## 2023-02-15 DIAGNOSIS — Z1211 Encounter for screening for malignant neoplasm of colon: Secondary | ICD-10-CM

## 2023-02-15 DIAGNOSIS — H6692 Otitis media, unspecified, left ear: Secondary | ICD-10-CM

## 2023-02-15 MED ORDER — TRIAMTERENE-HCTZ 75-50 MG PO TABS: 1.0000 | ORAL_TABLET | Freq: Every day | ORAL | 1 refills | Status: DC

## 2023-02-15 MED ORDER — MONTELUKAST SODIUM 10 MG PO TABS: 10.0000 mg | ORAL_TABLET | Freq: Every day | ORAL | 3 refills | Status: DC

## 2023-02-15 MED ORDER — LORATADINE 10 MG PO TABS: 10.0000 mg | ORAL_TABLET | Freq: Every day | ORAL | 11 refills | Status: DC

## 2023-02-15 MED ORDER — ZEPBOUND 2.5 MG/0.5ML ~~LOC~~ SOAJ: 2.5000 mg | SUBCUTANEOUS | 0 refills | Status: DC

## 2023-02-15 NOTE — Patient Instructions (Addendum)
F/u in  4 months, call if you need me sooner, TdAP at next visit  CONGRATS on ongoing weight loss  New is once weekly zepbound let us know if you can get this   Please send in cologuard test  Reconsider Covid vaccine  PLEASE  DO get flu vaccine  Fasting lipid, cmp and eGFR, HBA1C  1 week before follow up visit  Thanks for choosing [facility], we consider it a privelige to serve you.

## 2023-02-17 ENCOUNTER — Telehealth: Payer: Self-pay | Admitting: Family Medicine

## 2023-02-17 MED ORDER — SEMAGLUTIDE-WEIGHT MANAGEMENT 0.25 MG/0.5ML ~~LOC~~ SOAJ: 0.2500 mg | SUBCUTANEOUS | 0 refills | Status: DC

## 2023-02-17 MED ORDER — AZITHROMYCIN 250 MG PO TABS: ORAL_TABLET | ORAL | 0 refills | Status: AC

## 2023-02-17 NOTE — Telephone Encounter (Signed)
[facility] prescribed and pt is aware

## 2023-02-17 NOTE — Telephone Encounter (Signed)
Patient called in regard to ZEPBOUND 2.5 MG/0.5ML Pen   Insurance does not cover med and cost is $1000  Patient wants to try alternative medication.

## 2023-02-19 DIAGNOSIS — H6692 Otitis media, unspecified, left ear: Secondary | ICD-10-CM | POA: Insufficient documentation

## 2023-02-19 DIAGNOSIS — Z1211 Encounter for screening for malignant neoplasm of colon: Secondary | ICD-10-CM | POA: Insufficient documentation

## 2023-02-19 NOTE — Assessment & Plan Note (Signed)
Annual exam as documented. Counseling done  re healthy lifestyle involving commitment to 150 minutes exercise per week, heart healthy diet, and attaining healthy weight.The importance of adequate sleep also discussed. Regular seat belt use and home safety, is also discussed.  Immunization and cancer screening needs are specifically addressed at this visit.  

## 2023-02-19 NOTE — Progress Notes (Signed)
    Brandy Dean     MRN: [id number]      DOB: 09-04-1950  Chief Complaint  Patient presents with   Annual Exam    CPE- L-40          R-50         B-40 pt. Has questions about using Symbicort again, asking for new tubing and mask for nebulizer. Scheduling for colonoscopy she is waiting on colo-guard.    Ear Fullness    Feels like there is fluid in left ear.    Immunizations    Tdap- has gotten at local pharmacy Flu- declines flu shot.      HPI: Patient is in for annual physical exam. Concerns as above Recent labs,  are reviewed. Immunization is reviewed , and  flu vaccine refused  PE: BP 133/77 (BP Location: Right Arm, Patient Position: Sitting, Cuff Size: Large)   Pulse (!) 59   Ht 5\' 4"  (1.626 m)   Wt 250 lb (113.4 kg)   SpO2 90%   BMI 42.91 kg/m   Pleasant  female, alert and oriented x 3, in no cardio-pulmonary distress. Afebrile. HEENT No facial trauma or asymetry. Sinuses non tender.  Extra occullar muscles intact.. External ears normal, Left TM mildly erythematous, with good light reflex, right TM normal Neck: supple, no adenopathy,JVD or thyromegaly.No bruits.  Chest: Clear to ascultation bilaterally.No crackles or wheezes. Non tender to palpation  Breast: Not examined  Cardiovascular system; Heart sounds normal,  S1 and  S2 ,no S3.  Systolic  murmur, no thrill. Peripheral pulses normal.  Abdomen: Soft, non tender,   Musculoskeletal exam: Decreased though adequate  ROM of spine, hips , shoulders and knees.  No muscle wasting or atrophy.   Neurologic: Cranial nerves 2 to 12 intact. Power, tone ,sensation  normal throughout. Mild  disturbance in gait. No tremor.  Skin: Intact, no ulceration, erythema , scaling or rash noted. Pigmentation normal throughout  Psych; Normal mood and affect. Judgement and concentration normal   Assessment & Plan:  Annual visit for general adult medical examination with abnormal findings Annual exam as  documented. Counseling done  re healthy lifestyle involving commitment to 150 minutes exercise per week, heart healthy diet, and attaining healthy weight.The importance of adequate sleep also discussed. Regular seat belt use and home safety, is also discussed.  Immunization and cancer screening needs are specifically addressed at this visit.   Left otitis media  Z pack prescribed  Asthma, cough variant Needs tubing and mask prescribed  Colon cancer screening Requests cologuard be ordered  Morbid obesity (HCC) Improving, will request med to assist  Patient re-educated about  the importance of commitment to a  minimum of 150 minutes of exercise per week as able.  The importance of healthy food choices with portion control discussed, as well as eating regularly and within a 12 hour window most days. The need to choose "clean , green" food 50 to 75% of the time is discussed, as well as to make water the primary drink and set a goal of 64 ounces water daily.       02/15/2023    4:03 PM 11/04/2022    4:22 PM 08/14/2022   12:56 PM  Weight /BMI  Weight 250 lb 263 lb 277 lb 9.6 oz  Height 5\' 4"  (1.626 m) 5\' 4"  (1.626 m) 5\' 4"  (1.626 m)  BMI 42.91 kg/m2 45.14 kg/m2 47.65 kg/m2

## 2023-02-19 NOTE — Assessment & Plan Note (Signed)
Z pack prescribed 

## 2023-02-19 NOTE — Assessment & Plan Note (Signed)
Improving, will request med to assist  Patient re-educated about  the importance of commitment to a  minimum of 150 minutes of exercise per week as able.  The importance of healthy food choices with portion control discussed, as well as eating regularly and within a 12 hour window most days. The need to choose "clean , green" food 50 to 75% of the time is discussed, as well as to make water the primary drink and set a goal of 64 ounces water daily.       02/15/2023    4:03 PM 11/04/2022    4:22 PM 08/14/2022   12:56 PM  Weight /BMI  Weight 250 lb 263 lb 277 lb 9.6 oz  Height 5\' 4"  (1.626 m) 5\' 4"  (1.626 m) 5\' 4"  (1.626 m)  BMI 42.91 kg/m2 45.14 kg/m2 47.65 kg/m2

## 2023-02-19 NOTE — Assessment & Plan Note (Signed)
Needs tubing and mask prescribed

## 2023-02-19 NOTE — Assessment & Plan Note (Signed)
Requests cologuard be ordered

## 2023-02-22 ENCOUNTER — Other Ambulatory Visit: Payer: Self-pay | Admitting: Family Medicine

## 2023-02-22 MED ORDER — PHENTERMINE HCL 37.5 MG PO TABS: ORAL_TABLET | ORAL | 3 refills | Status: DC

## 2023-02-22 NOTE — Addendum Note (Signed)
Addended by: Kerri Perches on: 02/22/2023 04:38 PM   Modules accepted: Orders

## 2023-02-22 NOTE — Addendum Note (Signed)
Addended by: Syliva Overman E on: 02/22/2023 05:03 PM   Modules accepted: Orders

## 2023-02-22 NOTE — Telephone Encounter (Signed)
Patient called in regard to [facility].  Medication is too expensive . Wants to see if she can try phentermine Wants a call back in regard

## 2023-03-17 ENCOUNTER — Other Ambulatory Visit: Payer: Self-pay | Admitting: Family Medicine

## 2023-03-17 DIAGNOSIS — Z1211 Encounter for screening for malignant neoplasm of colon: Secondary | ICD-10-CM

## 2023-03-17 DIAGNOSIS — Z1212 Encounter for screening for malignant neoplasm of rectum: Secondary | ICD-10-CM

## 2023-04-10 ENCOUNTER — Other Ambulatory Visit: Payer: Self-pay | Admitting: Family Medicine

## 2023-04-10 DIAGNOSIS — B369 Superficial mycosis, unspecified: Secondary | ICD-10-CM

## 2023-05-04 LAB — COLOGUARD: COLOGUARD: NEGATIVE

## 2023-06-20 ENCOUNTER — Telehealth: Payer: Self-pay

## 2023-06-20 ENCOUNTER — Ambulatory Visit: Payer: BC Managed Care – PPO | Admitting: Family Medicine

## 2023-06-20 NOTE — Telephone Encounter (Signed)
 Copied from CRM [id number]. Topic: Clinical - Medication Question >> Jun 19, 2023  2:06 PM [name] wrote: Reason for CRM: Patient states that she does not have a preventative medication for her asthma. She wants to try a prescription she has heard about called Airsupra . Callback number is [phone]  Please advise ?

## 2023-06-26 NOTE — Telephone Encounter (Signed)
 Not covered by her ins , she will have to try and fail other preventive meds Pls let me know isf possible what is preferred on her plan I will send in

## 2023-07-18 ENCOUNTER — Ambulatory Visit: Payer: 59 | Admitting: Family Medicine

## 2023-07-18 ENCOUNTER — Encounter: Payer: Self-pay | Admitting: Family Medicine

## 2023-07-18 VITALS — BP 120/82 | HR 57 | Ht 64.0 in | Wt 246.0 lb

## 2023-07-18 DIAGNOSIS — R300 Dysuria: Secondary | ICD-10-CM

## 2023-07-18 DIAGNOSIS — R82998 Other abnormal findings in urine: Secondary | ICD-10-CM

## 2023-07-18 DIAGNOSIS — R7303 Prediabetes: Secondary | ICD-10-CM | POA: Diagnosis not present

## 2023-07-18 DIAGNOSIS — I10 Essential (primary) hypertension: Secondary | ICD-10-CM

## 2023-07-18 DIAGNOSIS — J45991 Cough variant asthma: Secondary | ICD-10-CM

## 2023-07-18 DIAGNOSIS — E782 Mixed hyperlipidemia: Secondary | ICD-10-CM

## 2023-07-18 DIAGNOSIS — Z1211 Encounter for screening for malignant neoplasm of colon: Secondary | ICD-10-CM

## 2023-07-18 DIAGNOSIS — Z1231 Encounter for screening mammogram for malignant neoplasm of breast: Secondary | ICD-10-CM

## 2023-07-18 MED ORDER — AIRSUPRA 90-80 MCG/ACT IN AERO: 2.0000 | INHALATION_SPRAY | RESPIRATORY_TRACT | 5 refills | Status: DC | PRN

## 2023-07-18 NOTE — Patient Instructions (Addendum)
 Annual exam in 18 weeks   Need to submit urine for analysis as report pink urine x 1 month, and c/s if no infection and blood you will be referred to Urology  [place] is prescribed  Please schedule mammogram and let pt know (nurse to order in am)  Reconsider flu vaccine  Keep up good weight loss  Have pharmacy request refills  Thanks for choosing [facility], we consider it a privelige to serve you.

## 2023-07-22 LAB — LIPID PANEL
Chol/HDL Ratio: 3 {ratio} (ref 0.0–4.4)
Cholesterol, Total: 252 mg/dL — ABNORMAL HIGH (ref 100–199)
HDL: 83 mg/dL (ref >39)
LDL Chol Calc (NIH): 161 mg/dL — ABNORMAL HIGH (ref 0–99)
Triglycerides: 54 mg/dL (ref 0–149)
VLDL Cholesterol Cal: 8 mg/dL (ref 5–40)

## 2023-07-22 LAB — CMP14+EGFR
ALT: 10 [IU]/L (ref 0–32)
AST: 13 [IU]/L (ref 0–40)
Albumin: 4.6 g/dL (ref 3.8–4.8)
Alkaline Phosphatase: 69 [IU]/L (ref 44–121)
BUN/Creatinine Ratio: 19 (ref 12–28)
BUN: 15 mg/dL (ref 8–27)
Bilirubin Total: 0.6 mg/dL (ref 0.0–1.2)
CO2: 25 mmol/L (ref 20–29)
Calcium: 10.7 mg/dL — ABNORMAL HIGH (ref 8.7–10.3)
Chloride: 97 mmol/L (ref 96–106)
Creatinine, Ser: 0.78 mg/dL (ref 0.57–1.00)
Globulin, Total: 2.8 g/dL (ref 1.5–4.5)
Glucose: 83 mg/dL (ref 70–99)
Potassium: 4.2 mmol/L (ref 3.5–5.2)
Sodium: 137 mmol/L (ref 134–144)
Total Protein: 7.4 g/dL (ref 6.0–8.5)
eGFR: 81 mL/min/{1.73_m2} (ref >59)

## 2023-07-22 LAB — HEMOGLOBIN A1C
Est. average glucose Bld gHb Est-mCnc: 120 mg/dL
Hgb A1c MFr Bld: 5.8 % — ABNORMAL HIGH (ref 4.8–5.6)

## 2023-07-25 ENCOUNTER — Encounter: Payer: Self-pay | Admitting: Family Medicine

## 2023-07-25 DIAGNOSIS — R300 Dysuria: Secondary | ICD-10-CM | POA: Insufficient documentation

## 2023-07-25 DIAGNOSIS — Z1231 Encounter for screening mammogram for malignant neoplasm of breast: Secondary | ICD-10-CM | POA: Insufficient documentation

## 2023-07-25 DIAGNOSIS — R82998 Other abnormal findings in urine: Secondary | ICD-10-CM | POA: Insufficient documentation

## 2023-07-25 NOTE — Assessment & Plan Note (Signed)
Change in rescue med requested, pot will notify if not covered , shoulkd havce commercial cards for free will d/w Nurse

## 2023-07-25 NOTE — Assessment & Plan Note (Signed)
CCUA and c/s , will need urology appt for further eval if negative

## 2023-07-25 NOTE — Assessment & Plan Note (Signed)
Patient educated about the importance of limiting  Carbohydrate intake , the need to commit to daily physical activity for a minimum of 30 minutes , and to commit weight loss. The fact that changes in all these areas will reduce or eliminate all together the development of diabetes is stressed.      Latest Ref Rng & Units 07/21/2023    3:57 PM 11/25/2022   10:43 AM 01/04/2022    1:56 PM 11/20/2019    2:57 PM 03/21/2019    4:15 PM  Diabetic Labs  HbA1c 4.8 - 5.6 % 5.8  5.8  5.9  5.4    Chol 100 - 199 mg/dL 696  295  284  132  440   HDL >39 mg/dL 83  80  71  84  72   Calc LDL 0 - 99 mg/dL 102  725  366  440  347   Triglycerides 0 - 149 mg/dL 54  59  85  72  83   Creatinine 0.57 - 1.00 mg/dL 4.25  9.56  3.87  5.64  0.77       07/18/2023    4:19 PM 07/18/2023    4:18 PM 02/15/2023    4:03 PM 11/04/2022    4:50 PM 11/04/2022    4:22 PM 08/14/2022    2:54 PM 08/14/2022    1:00 PM  BP/Weight  Systolic BP 120 156 133 136 124 140 144  Diastolic BP 82 77 77 82 74 90 93  Wt. (Lbs)  246 250  263    BMI  42.23 kg/m2 42.91 kg/m2  45.14 kg/m2         No data to display          unchanged

## 2023-07-25 NOTE — Assessment & Plan Note (Signed)
Hyperlipidemia:Low fat diet discussed and encouraged.   Lipid Panel  Lab Results  Component Value Date   CHOL 252 (H) 07/21/2023   HDL 83 07/21/2023   LDLCALC 161 (H) 07/21/2023   TRIG 54 07/21/2023   CHOLHDL 3.0 07/21/2023     Deteriorated needs to work more consistently on food choice

## 2023-07-25 NOTE — Assessment & Plan Note (Signed)
Controlled, no change in medication DASH diet and commitment to daily physical activity for a minimum of 30 minutes discussed and encouraged, as a part of hypertension management. The importance of attaining a healthy weight is also discussed.     07/18/2023    4:19 PM 07/18/2023    4:18 PM 02/15/2023    4:03 PM 11/04/2022    4:50 PM 11/04/2022    4:22 PM 08/14/2022    2:54 PM 08/14/2022    1:00 PM  BP/Weight  Systolic BP 120 156 133 136 124 140 144  Diastolic BP 82 77 77 82 74 90 93  Wt. (Lbs)  246 250  263    BMI  42.23 kg/m2 42.91 kg/m2  45.14 kg/m2

## 2023-07-25 NOTE — Assessment & Plan Note (Signed)
Negative cologuard in 04/2023

## 2023-07-25 NOTE — Assessment & Plan Note (Signed)
  Patient re-educated about  the importance of commitment to a  minimum of 150 minutes of exercise per week as able.  The importance of healthy food choices with portion control discussed, as well as eating regularly and within a 12 hour window most days. The need to choose "clean , green" food 50 to 75% of the time is discussed, as well as to make water the primary drink and set a goal of 64 ounces water daily.       07/18/2023    4:18 PM 02/15/2023    4:03 PM 11/04/2022    4:22 PM  Weight /BMI  Weight 246 lb 250 lb 263 lb  Height 5\' 4"  (1.626 m) 5\' 4"  (1.626 m) 5\' 4"  (1.626 m)  BMI 42.23 kg/m2 42.91 kg/m2 45.14 kg/m2    Excellent weight loss through chnae in diet, she is to continue same

## 2023-07-25 NOTE — Progress Notes (Signed)
 Brandy Dean     MRN: [id number]      DOB: Mar 02, 1951  Chief Complaint  Patient presents with   Follow-up    4 month follow up    HPI Brandy Dean is here for follow up and re-evaluation of chronic medical conditions, medication management and review of any available recent lab and radiology data.  Preventive health is updated, specifically  Cancer screening and Immunization.  Refuses flu vaccineRefuses flu vaccine Questions or concerns regarding consultations or procedures which the PT has had in the interim are  addressed. The PT denies any adverse reactions to current medications since the last visit.Wants new rescue inhaler     ROS Denies recent fever or chills. Denies sinus pressure, nasal congestion, ear pain or sore throat. Denies chest congestion, productive cough or wheezing. Denies chest pains, palpitations and leg swelling Denies abdominal pain, nausea, vomiting,diarrhea or constipation.   Denies dysuria, frequency, hesitancy or incontinence. Denies joint pain, swelling and limitation in mobility. Denies headaches, seizures, numbness, or tingling. Denies depression, anxiety or insomnia. Denies skin break down or rash.   PE  BP 120/82 (BP Location: Left Arm, Patient Position: Sitting, Cuff Size: Large)   Pulse (!) 57   Ht 5' 4 (1.626 m)   Wt 246 lb (111.6 kg)   SpO2 90%   BMI 42.23 kg/m   Patient alert and oriented and in no cardiopulmonary distress.  HEENT: No facial asymmetry, EOMI,     Neck supple .  Chest: Clear to auscultation bilaterally.  CVS: S1, S2 no murmurs, no S3.Regular rate.  ABD: Soft non tender. No renal angle tenderness  Ext: No edema  MS: Adequate ROM spine, shoulders, hips and knees.  Skin: Intact, no ulcerations or rash noted.  Psych: Good eye contact, normal affect. Memory intact not anxious or depressed appearing.  CNS: CN 2-12 intact, power,  normal throughout.no focal deficits noted.   Assessment & Plan  Brandy Dean CCUA and c/s , will need urology appt for further eval if negative  Asthma, cough variant Change in rescue med requested, pot will notify if not covered , shoulkd havce commercial cards for free will d/w Nurse  Colon cancer screening Negative cologuard in 04/2023  Prediabetes Patient educated about the importance of limiting  Carbohydrate intake , the need to commit to daily physical activity for a minimum of 30 minutes , and to commit weight loss. The fact that changes in all these areas will reduce or eliminate all together the development of diabetes is stressed.      Latest Ref Rng & Units 07/21/2023    3:57 PM 11/25/2022   10:43 AM 01/04/2022    1:56 PM 11/20/2019    2:57 PM 03/21/2019    4:15 PM  Diabetic Labs  HbA1c 4.8 - 5.6 % 5.8  5.8  5.9  5.4    Chol 100 - 199 mg/dL 747  759  747  751  766   HDL >39 mg/dL 83  80  71  84  72   Calc LDL 0 - 99 mg/dL 838  849  832  852  857   Triglycerides 0 - 149 mg/dL 54  59  85  72  83   Creatinine 0.57 - 1.00 mg/dL 9.21  9.17  9.19  9.18  0.77       07/18/2023    4:19 PM 07/18/2023    4:18 PM 02/15/2023    4:03 PM 11/04/2022    4:50 PM  11/04/2022    4:22 PM 08/14/2022    2:54 PM 08/14/2022    1:00 PM  BP/Weight  Systolic BP 120 156 133 136 124 140 144  Diastolic BP 82 77 77 82 74 90 93  Wt. (Lbs)  246 250  263    BMI  42.23 kg/m2 42.91 kg/m2  45.14 kg/m2         No data to display          unchanged  Morbid obesity (HCC)  Patient re-educated about  the importance of commitment to a  minimum of 150 minutes of exercise per week as able.  The importance of healthy food choices with portion control discussed, as well as eating regularly and within a 12 hour window most days. The need to choose clean , green food 50 to 75% of the time is discussed, as well as to make water  the primary drink and set a goal of 64 ounces water  daily.       07/18/2023    4:18 PM 02/15/2023    4:03 PM 11/04/2022    4:22 PM  Weight /BMI  Weight 246  lb 250 lb 263 lb  Height 5' 4 (1.626 m) 5' 4 (1.626 m) 5' 4 (1.626 m)  BMI 42.23 kg/m2 42.91 kg/m2 45.14 kg/m2    Excellent weight loss through chnae in diet, she is to continue same  Mixed hyperlipidemia Hyperlipidemia:Low fat diet discussed and encouraged.   Lipid Panel  Lab Results  Component Value Date   CHOL 252 (H) 07/21/2023   HDL 83 07/21/2023   LDLCALC 161 (H) 07/21/2023   TRIG 54 07/21/2023   CHOLHDL 3.0 07/21/2023     Deteriorated needs to work more consistently on food choice  Essential hypertension Controlled, no change in medication DASH diet and commitment to daily physical activity for a minimum of 30 minutes discussed and encouraged, as a part of hypertension management. The importance of attaining a healthy weight is also discussed.     07/18/2023    4:19 PM 07/18/2023    4:18 PM 02/15/2023    4:03 PM 11/04/2022    4:50 PM 11/04/2022    4:22 PM 08/14/2022    2:54 PM 08/14/2022    1:00 PM  BP/Weight  Systolic BP 120 156 133 136 124 140 144  Diastolic BP 82 77 77 82 74 90 93  Wt. (Lbs)  246 250  263    BMI  42.23 kg/m2 42.91 kg/m2  45.14 kg/m2

## 2023-08-09 ENCOUNTER — Ambulatory Visit: Payer: Self-pay | Admitting: Family Medicine

## 2023-08-17 ENCOUNTER — Other Ambulatory Visit: Payer: Self-pay | Admitting: Family Medicine

## 2023-08-17 DIAGNOSIS — I10 Essential (primary) hypertension: Secondary | ICD-10-CM

## 2023-11-22 ENCOUNTER — Encounter: Payer: 59 | Admitting: Family Medicine

## 2023-11-22 ENCOUNTER — Ambulatory Visit: Payer: 59 | Admitting: Family Medicine

## 2023-11-22 ENCOUNTER — Encounter: Payer: Self-pay | Admitting: Family Medicine

## 2023-11-22 VITALS — BP 151/71 | HR 59 | Resp 16 | Ht 64.0 in | Wt 242.8 lb

## 2023-11-22 DIAGNOSIS — R7303 Prediabetes: Secondary | ICD-10-CM | POA: Diagnosis not present

## 2023-11-22 DIAGNOSIS — R82998 Other abnormal findings in urine: Secondary | ICD-10-CM | POA: Diagnosis not present

## 2023-11-22 DIAGNOSIS — N950 Postmenopausal bleeding: Secondary | ICD-10-CM

## 2023-11-22 DIAGNOSIS — I10 Essential (primary) hypertension: Secondary | ICD-10-CM | POA: Diagnosis not present

## 2023-11-22 DIAGNOSIS — E782 Mixed hyperlipidemia: Secondary | ICD-10-CM

## 2023-11-22 MED ORDER — AMLODIPINE BESYLATE 2.5 MG PO TABS: 2.5000 mg | ORAL_TABLET | Freq: Every day | ORAL | 1 refills | Status: DC

## 2023-11-22 NOTE — Patient Instructions (Addendum)
 Annual exam in September, call if you need me sooner  Nurse bP check in 4 weeks  You are being referred to Gynecology re possible bloody discharge, post menopause, since Jan 2025  You are being referred to urology re pink urine since Jnauary  CCUA and c/s to be sent from office today  Fasting lipid, cmp and EDGFr and hBA1C 5 to 7 days before September ap[pointment  New ADDITIONAL med for blood pressure, amlodipine ,  CONTINUE what you are taking, so now you are on TWO blood pressure meds  It is important that you exercise regularly at least 30 minutes 5 times a week. If you develop chest pain, have severe difficulty breathing, or feel very tired, stop exercising immediately and seek medical attention   Think about what you will eat, plan ahead. Choose  clean, green, fresh or frozen over canned, processed or packaged foods which are more sugary, salty and fatty. 70 to 75% of food eaten should be vegetables and fruit. Three meals at set times with snacks allowed between meals, but they must be fruit or vegetables. Aim to eat over a 12 hour period , example 7 am to 7 pm, and STOP after  your last meal of the day. Drink water,generally about 64 ounces per day, no other drink is as healthy. Fruit juice is best enjoyed in a healthy way, by EATING the fruit. Thanks for choosing [facility], we consider it a privelige to serve you.

## 2023-11-23 ENCOUNTER — Ambulatory Visit: Payer: Self-pay | Admitting: Family Medicine

## 2023-11-23 ENCOUNTER — Telehealth: Payer: Self-pay

## 2023-11-23 LAB — UA/M W/RFLX CULTURE, ROUTINE
Bilirubin, UA: NEGATIVE
Glucose, UA: NEGATIVE
Ketones, UA: NEGATIVE
Leukocytes,UA: NEGATIVE
Nitrite, UA: NEGATIVE
Protein,UA: NEGATIVE
RBC, UA: NEGATIVE
Specific Gravity, UA: 1.013 (ref 1.005–1.030)
Urobilinogen, Ur: 1 mg/dL (ref 0.2–1.0)
pH, UA: 7 (ref 5.0–7.5)

## 2023-11-23 LAB — MICROSCOPIC EXAMINATION
Bacteria, UA: NONE SEEN
Casts: NONE SEEN /LPF
Epithelial Cells (non renal): NONE SEEN /HPF (ref 0–10)
WBC, UA: NONE SEEN /HPF (ref 0–5)

## 2023-11-23 NOTE — Telephone Encounter (Signed)
 Copied from CRM [id number]. Topic: Clinical - Medical Advice >> Nov 23, 2023  2:10 PM Carlatta H wrote: Reason for CRM: Please call patient she would like to know which provider to pick and [facility]//

## 2023-11-24 NOTE — Telephone Encounter (Signed)
 Spoke with pt

## 2023-12-04 ENCOUNTER — Other Ambulatory Visit: Payer: Self-pay | Admitting: Family Medicine

## 2023-12-12 ENCOUNTER — Encounter: Payer: Self-pay | Admitting: Family Medicine

## 2023-12-12 DIAGNOSIS — N950 Postmenopausal bleeding: Secondary | ICD-10-CM | POA: Insufficient documentation

## 2023-12-12 NOTE — Assessment & Plan Note (Signed)
 Patient educated about the importance of limiting  Carbohydrate intake , the need to commit to daily physical activity for a minimum of 30 minutes , and to commit weight loss. The fact that changes in all these areas will reduce or eliminate all together the development of diabetes is stressed.      Latest Ref Rng & Units 07/21/2023    3:57 PM 11/25/2022   10:43 AM 01/04/2022    1:56 PM 11/20/2019    2:57 PM 03/21/2019    4:15 PM  Diabetic Labs  HbA1c 4.8 - 5.6 % 5.8  5.8  5.9  5.4    Chol 100 - 199 mg/dL 747  759  747  751  766   HDL >39 mg/dL 83  80  71  84  72   Calc LDL 0 - 99 mg/dL 838  849  832  852  857   Triglycerides 0 - 149 mg/dL 54  59  85  72  83   Creatinine 0.57 - 1.00 mg/dL 9.21  9.17  9.19  9.18  0.77       11/22/2023    3:46 PM 07/18/2023    4:19 PM 07/18/2023    4:18 PM 02/15/2023    4:03 PM 11/04/2022    4:50 PM 11/04/2022    4:22 PM 08/14/2022    2:54 PM  BP/Weight  Systolic BP 151 120 156 133 136 124 140  Diastolic BP 71 82 77 77 82 74 90  Wt. (Lbs) 242.8  246 250  263   BMI 41.68 kg/m2  42.23 kg/m2 42.91 kg/m2  45.14 kg/m2        No data to display          Updated lab needed at/ before next visit.

## 2023-12-12 NOTE — Progress Notes (Signed)
 Brandy Dean     MRN: [id number]      DOB: [birth year]  Chief Complaint  Patient presents with   Hypertension    Follow up visit     HPI Brandy Dean is here for follow up and re-evaluation of chronic medical conditions, medication management and review of any available recent lab and radiology data.  Preventive health is updated, specifically  Cancer screening and Immunization.   Questions or concerns regarding consultations or procedures which the PT has had in the interim are  addressed. The PT denies any adverse reactions to current medications since the last visit.  Still c/o pink urine and possib;r vaginal/ post menopausal bleeding x 6 months  ROS Denies recent fever or chills. Denies sinus pressure, nasal congestion, ear pain or sore throat. Denies chest congestion, productive cough or wheezing. Denies chest pains, palpitations and leg swelling Denies abdominal pain, nausea, vomiting,diarrhea or constipation.   Denies dysuria, frequency, hesitancy or incontinence. Denies joint pain, swelling and limitation in mobility. Denies headaches, seizures, numbness, or tingling. Denies depression, anxiety or insomnia. Denies skin break down or rash.   PE  BP (!) 151/71   Pulse (!) 59   Resp 16   Ht 5' 4 (1.626 m)   Wt 242 lb 12.8 oz (110.1 kg)   SpO2 90%   BMI 41.68 kg/m   Patient alert and oriented and in no cardiopulmonary distress.  HEENT: No facial asymmetry, EOMI,     Neck supple .  Chest: Clear to auscultation bilaterally.  CVS: S1, S2 no murmurs, no S3.Regular rate.  ABD: Soft non tender.   Ext: No edema  MS: Adequate though reduced  ROM spine, shoulders, hips and knees.  Skin: Intact, no ulcerations or rash noted.  Psych: Good eye contact, normal affect. Memory intact not anxious or depressed appearing.  CNS: CN 2-12 intact, power,  normal throughout.no focal deficits noted.   Assessment & Plan  Pink-colored urine Reports persistent pink urine  CCUA negative fdor blood, refer iurology  PMB (postmenopausal bleeding) Possible PMB reports intermittent vaginal bleeding over past 4 months , refer Gyne  Prediabetes Patient educated about the importance of limiting  Carbohydrate intake , the need to commit to daily physical activity for a minimum of 30 minutes , and to commit weight loss. The fact that changes in all these areas will reduce or eliminate all together the development of diabetes is stressed.      Latest Ref Rng & Units 07/21/2023    3:57 PM 11/25/2022   10:43 AM 01/04/2022    1:56 PM 11/20/2019    2:57 PM 03/21/2019    4:15 PM  Diabetic Labs  HbA1c 4.8 - 5.6 % 5.8  5.8  5.9  5.4    Chol 100 - 199 mg/dL 747  759  747  751  766   HDL >39 mg/dL 83  80  71  84  72   Calc LDL 0 - 99 mg/dL 838  849  832  852  857   Triglycerides 0 - 149 mg/dL 54  59  85  72  83   Creatinine 0.57 - 1.00 mg/dL 9.21  9.17  9.19  9.18  0.77       11/22/2023    3:46 PM 07/18/2023    4:19 PM 07/18/2023    4:18 PM 02/15/2023    4:03 PM 11/04/2022    4:50 PM 11/04/2022    4:22 PM 08/14/2022    2:54 PM  BP/Weight  Systolic BP 151 120 156 133 136 124 140  Diastolic BP 71 82 77 77 82 74 90  Wt. (Lbs) 242.8  246 250  263   BMI 41.68 kg/m2  42.23 kg/m2 42.91 kg/m2  45.14 kg/m2        No data to display          Updated lab needed at/ before next visit.   Morbid obesity (HCC)  Patient re-educated about  the importance of commitment to a  minimum of 150 minutes of exercise per week as able.  The importance of healthy food choices with portion control discussed, as well as eating regularly and within a 12 hour window most days. The need to choose clean , green food 50 to 75% of the time is discussed, as well as to make water the primary drink and set a goal of 64 ounces water daily.       11/22/2023    3:46 PM 07/18/2023    4:18 PM 02/15/2023    4:03 PM  Weight /BMI  Weight 242 lb 12.8 oz 246 lb 250 lb  Height 5' 4 (1.626 m) 5' 4 (1.626 m) 5'  4 (1.626 m)  BMI 41.68 kg/m2 42.23 kg/m2 42.91 kg/m2    Improved , great  Mixed hyperlipidemia Hyperlipidemia:Low fat diet discussed and encouraged.   Lipid Panel  Lab Results  Component Value Date   CHOL 252 (H) 07/21/2023   HDL 83 07/21/2023   LDLCALC 161 (H) 07/21/2023   TRIG 54 07/21/2023   CHOLHDL 3.0 07/21/2023     ,ul   Essential hypertension Uncontrolled add amlodipine  2.5 mg  Nurse BP check in 4 weeks DASH diet and commitment to daily physical activity for a minimum of 30 minutes discussed and encouraged, as a part of hypertension management. The importance of attaining a healthy weight is also discussed.     11/22/2023    3:46 PM 07/18/2023    4:19 PM 07/18/2023    4:18 PM 02/15/2023    4:03 PM 11/04/2022    4:50 PM 11/04/2022    4:22 PM 08/14/2022    2:54 PM  BP/Weight  Systolic BP 151 120 156 133 136 124 140  Diastolic BP 71 82 77 77 82 74 90  Wt. (Lbs) 242.8  246 250  263   BMI 41.68 kg/m2  42.23 kg/m2 42.91 kg/m2  45.14 kg/m2

## 2023-12-12 NOTE — Assessment & Plan Note (Signed)
  Patient re-educated about  the importance of commitment to a  minimum of 150 minutes of exercise per week as able.  The importance of healthy food choices with portion control discussed, as well as eating regularly and within a 12 hour window most days. The need to choose clean , green food 50 to 75% of the time is discussed, as well as to make water the primary drink and set a goal of 64 ounces water daily.       11/22/2023    3:46 PM 07/18/2023    4:18 PM 02/15/2023    4:03 PM  Weight /BMI  Weight 242 lb 12.8 oz 246 lb 250 lb  Height 5' 4 (1.626 m) 5' 4 (1.626 m) 5' 4 (1.626 m)  BMI 41.68 kg/m2 42.23 kg/m2 42.91 kg/m2    Improved , great

## 2023-12-12 NOTE — Assessment & Plan Note (Signed)
 Possible PMB reports intermittent vaginal bleeding over past 4 months , refer Gyne

## 2023-12-12 NOTE — Assessment & Plan Note (Signed)
 Uncontrolled add amlodipine  2.5 mg  Nurse BP check in 4 weeks DASH diet and commitment to daily physical activity for a minimum of 30 minutes discussed and encouraged, as a part of hypertension management. The importance of attaining a healthy weight is also discussed.     11/22/2023    3:46 PM 07/18/2023    4:19 PM 07/18/2023    4:18 PM 02/15/2023    4:03 PM 11/04/2022    4:50 PM 11/04/2022    4:22 PM 08/14/2022    2:54 PM  BP/Weight  Systolic BP 151 120 156 133 136 124 140  Diastolic BP 71 82 77 77 82 74 90  Wt. (Lbs) 242.8  246 250  263   BMI 41.68 kg/m2  42.23 kg/m2 42.91 kg/m2  45.14 kg/m2

## 2023-12-12 NOTE — Assessment & Plan Note (Signed)
 Reports persistent pink urine CCUA negative fdor blood, refer iurology

## 2023-12-12 NOTE — Assessment & Plan Note (Signed)
 Hyperlipidemia:Low fat diet discussed and encouraged.   Lipid Panel  Lab Results  Component Value Date   CHOL 252 (H) 07/21/2023   HDL 83 07/21/2023   LDLCALC 161 (H) 07/21/2023   TRIG 54 07/21/2023   CHOLHDL 3.0 07/21/2023     ,ul

## 2023-12-13 ENCOUNTER — Other Ambulatory Visit: Payer: Self-pay

## 2023-12-13 DIAGNOSIS — R7303 Prediabetes: Secondary | ICD-10-CM

## 2023-12-13 DIAGNOSIS — I10 Essential (primary) hypertension: Secondary | ICD-10-CM

## 2023-12-13 DIAGNOSIS — E782 Mixed hyperlipidemia: Secondary | ICD-10-CM

## 2023-12-20 ENCOUNTER — Ambulatory Visit

## 2023-12-20 VITALS — BP 151/79

## 2023-12-20 DIAGNOSIS — I10 Essential (primary) hypertension: Secondary | ICD-10-CM

## 2023-12-20 NOTE — Progress Notes (Signed)
 Patient is in office today for a nurse visit for Blood Pressure Check. Patient blood pressure was 151/79, Patient No dyspnea on exertion

## 2023-12-21 ENCOUNTER — Other Ambulatory Visit: Payer: Self-pay

## 2023-12-21 MED ORDER — CARVEDILOL 3.125 MG PO TABS: 3.1250 mg | ORAL_TABLET | Freq: Two times a day (BID) | ORAL | 3 refills | Status: DC

## 2023-12-21 NOTE — Addendum Note (Signed)
 Addended by: [name] on: 12/21/2023 09:58 AM   Modules accepted: Orders

## 2024-01-01 ENCOUNTER — Inpatient Hospital Stay (HOSPITAL_COMMUNITY): Admission: RE | Admit: 2024-01-01 | Source: Ambulatory Visit

## 2024-01-02 ENCOUNTER — Ambulatory Visit: Admitting: Urology

## 2024-01-04 ENCOUNTER — Encounter (HOSPITAL_COMMUNITY): Payer: Self-pay

## 2024-01-04 ENCOUNTER — Ambulatory Visit (HOSPITAL_COMMUNITY)
Admission: RE | Admit: 2024-01-04 | Discharge: 2024-01-04 | Disposition: A | Discharge status: Home / Self Care | Source: Ambulatory Visit | Attending: Family Medicine | Admitting: Family Medicine

## 2024-01-04 DIAGNOSIS — Z1231 Encounter for screening mammogram for malignant neoplasm of breast: Secondary | ICD-10-CM | POA: Insufficient documentation

## 2024-01-17 ENCOUNTER — Other Ambulatory Visit (HOSPITAL_COMMUNITY)
Admission: RE | Admit: 2024-01-17 | Discharge: 2024-01-17 | Disposition: A | Discharge status: Home / Self Care | Source: Ambulatory Visit | Attending: Obstetrics & Gynecology | Admitting: Obstetrics & Gynecology

## 2024-01-17 ENCOUNTER — Ambulatory Visit: Admitting: Obstetrics & Gynecology

## 2024-01-17 ENCOUNTER — Encounter: Payer: Self-pay | Admitting: Obstetrics & Gynecology

## 2024-01-17 VITALS — BP 144/77 | HR 58 | Ht 64.0 in | Wt 240.4 lb

## 2024-01-17 DIAGNOSIS — N950 Postmenopausal bleeding: Secondary | ICD-10-CM

## 2024-01-17 NOTE — Progress Notes (Signed)
 GYN VISIT Patient name: Brandy Dean MRN [id number]  Date of birth: [birth year] Chief Complaint:   postmenopausal bleeding  History of Present Illness:   Brandy Dean is a 73 y.o. G3P3000 PM female being seen today for the following concerns:  PMB:  Past 5-6 mos notes light pink spotting- typically every day.  She has to wear a pantyliner or use a paper towel because bleeding is so light.  Mostly see it when you wipe.    Denies cramping or pain.  Denies hematuria, just blood when she wipes.    No LMP recorded. Patient is postmenopausal.    Review of Systems:   Pertinent items are noted in HPI Denies fever/chills, dizziness, headaches, visual disturbances, fatigue, shortness of breath, chest pain, abdominal pain, vomiting Pertinent History Reviewed:   Past Surgical History:  Procedure Laterality Date   CESAREAN SECTION  1988 & 1991   Partial left lobectomy  1968   RIght and left keloid ears  1968    Past Medical History:  Diagnosis Date   Bacterial pneumonia    March  9-12,  2011   Bronchial asthma    Essential hypertension    Hyperlipidemia    Morbid obesity (HCC)    Sleep apnea    Reviewed problem list, medications and allergies. Physical Assessment:   Vitals:   01/17/24 1520  BP: (!) 144/77  Pulse: (!) 58  Weight: 240 lb 6.4 oz (109 kg)  Height: 5' 4 (1.626 m)  Body mass index is 41.26 kg/m.       Physical Examination:   General appearance: alert, well appearing, and in no distress  Psych: mood appropriate, normal affect  Skin: warm & dry   Cardiovascular: normal heart rate noted  Respiratory: normal respiratory effort, no distress  Abdomen: obese, soft, non-tender   Pelvic: VULVA: normal appearing vulva with no masses, tenderness or lesions, VAGINA: normal appearing vagina with normal color and discharge, no lesions, or bleeding noted.  CERVIX: normal appearing cervix without discharge or lesions, UTERUS: uterus is normal size, shape, consistency and  nontender.  Some difficulty with pelvic exam due to patient discomfort  Extremities: no edema   Chaperone: Aleck Blase    Endometrial Biopsy Procedure Note  Pre-operative Diagnosis: postmenopausal bleeding  Post-operative Diagnosis: same  Procedure Details  The risks (including infection, bleeding, pain, and uterine perforation) and benefits of the procedure were explained to the patient and Written informed consent was obtained.  Antibiotic prophylaxis against endocarditis was not indicated.   The patient was placed in the dorsal lithotomy position.  Bimanual exam showed the uterus to be in the neutral position.  A speculum inserted in the vagina, and the cervix prepped with betadine.     A single tooth tenaculum was applied to the anterior lip of the cervix for stabilization.  Os finder and small dilator was used to enter into the cervical canal..  A Pipelle endometrial aspirator was used to sample the endometrium.  Sample was sent for pathologic examination-only minimal sample obtained  Condition: Stable  Complications: None   Assessment & Plan:  1) Postmenopausal bleeding - Discussed potential causes ranging from vaginal atrophy to endometrial cancer.  Based on exam suspect spotting may likely be due to vaginal atrophy - Recommendation for pelvic ultrasound and EMB.  Informed consent obtained, procedure completed as above - Next step pending results of ultrasound and pathology   Orders Placed This Encounter  Procedures   US  PELVIC COMPLETE WITH TRANSVAGINAL  Return for pelvic US  @ [facility].   Caelum Federici, DO Attending Obstetrician & Gynecologist, [facility] [employer], [facility]

## 2024-01-19 LAB — SURGICAL PATHOLOGY

## 2024-01-23 ENCOUNTER — Ambulatory Visit (HOSPITAL_COMMUNITY)
Admission: RE | Admit: 2024-01-23 | Discharge: 2024-01-23 | Disposition: A | Discharge status: Home / Self Care | Source: Ambulatory Visit | Attending: Obstetrics & Gynecology | Admitting: Obstetrics & Gynecology

## 2024-01-23 DIAGNOSIS — N950 Postmenopausal bleeding: Secondary | ICD-10-CM | POA: Insufficient documentation

## 2024-01-30 ENCOUNTER — Encounter: Payer: Self-pay | Admitting: Obstetrics & Gynecology

## 2024-01-30 ENCOUNTER — Ambulatory Visit: Payer: Self-pay | Admitting: Obstetrics & Gynecology

## 2024-02-16 ENCOUNTER — Encounter: Payer: Self-pay | Admitting: Family Medicine

## 2024-02-16 ENCOUNTER — Ambulatory Visit (INDEPENDENT_AMBULATORY_CARE_PROVIDER_SITE_OTHER): Payer: Self-pay | Admitting: Family Medicine

## 2024-02-16 VITALS — BP 134/80 | HR 66 | Resp 18 | Ht 64.0 in | Wt 236.1 lb

## 2024-02-16 DIAGNOSIS — I10 Essential (primary) hypertension: Secondary | ICD-10-CM | POA: Diagnosis not present

## 2024-02-16 DIAGNOSIS — G8929 Other chronic pain: Secondary | ICD-10-CM

## 2024-02-16 DIAGNOSIS — M25511 Pain in right shoulder: Secondary | ICD-10-CM

## 2024-02-16 DIAGNOSIS — E559 Vitamin D deficiency, unspecified: Secondary | ICD-10-CM

## 2024-02-16 DIAGNOSIS — Z0001 Encounter for general adult medical examination with abnormal findings: Secondary | ICD-10-CM | POA: Diagnosis not present

## 2024-02-16 DIAGNOSIS — E782 Mixed hyperlipidemia: Secondary | ICD-10-CM | POA: Diagnosis not present

## 2024-02-16 DIAGNOSIS — M25512 Pain in left shoulder: Secondary | ICD-10-CM

## 2024-02-16 DIAGNOSIS — R7303 Prediabetes: Secondary | ICD-10-CM

## 2024-02-16 MED ORDER — AIRSUPRA 90-80 MCG/ACT IN AERO: 2.0000 | INHALATION_SPRAY | RESPIRATORY_TRACT | 5 refills | Status: AC | PRN

## 2024-02-16 MED ORDER — MELOXICAM 15 MG PO TABS
ORAL_TABLET | ORAL | 0 refills | Status: AC
Start: 2024-02-16 — End: ?

## 2024-02-16 NOTE — Progress Notes (Signed)
    Brandy Dean     MRN: [id number]      DOB: 1951/05/19  Chief Complaint  Patient presents with   Annual Exam    cpe    HPI: Patient is in for annual physical exam. C/o bilateral shoulder pain with reduced mobility, no inciting trauma  Immunization is reviewed , and declines vaccines   PE: BP 134/80   Pulse 66   Resp 18   Ht 5' 4 (1.626 m)   Wt 236 lb 1.9 oz (107.1 kg)   SpO2 90%   BMI 40.53 kg/m   Pleasant  female, alert and oriented x 3, in no cardio-pulmonary distress. Afebrile. HEENT No facial trauma or asymetry. Sinuses non tender.  Extra occullar muscles intact.. External ears normal, . Neck: supple, no adenopathy,JVD or thyromegaly.No bruits.  Chest: Clear to ascultation bilaterally.No crackles or wheezes. Non tender to palpation  Breast:  Not examined  Cardiovascular system; Heart sounds normal,  S1 and  S2 ,no S3.  No murmur, or thrill. Apical beat not displaced Peripheral pulses normal.  Abdomen: Soft, non tender, no organomegaly or masses. No bruits. Bowel sounds normal. No guarding, tenderness or rebound.   GU:  not examined Musculoskeletal exam: Decreased  ROM of spine, hips ,  and knees.Markedly decreased in both shoulders No deformity ,swelling or crepitus noted. No muscle wasting or atrophy.   Neurologic: Cranial nerves 2 to 12 intact. Power, tone ,sensation and reflexes normal throughout. No disturbance in gait. No tremor.  Skin: Intact, no ulceration, erythema , scaling or rash noted. Pigmentation normal throughout  Psych; Normal mood and affect. Judgement and concentration normal   Assessment & Plan:  Shoulder pain, bilateral Meloxicam  is prescribed, x rays and PT x 6 weeks, once weekly  Annual visit for general adult medical examination with abnormal findings Annual exam as documented. Counseling done  re healthy lifestyle involving commitment to 150 minutes exercise per week, heart healthy diet, and attaining  healthy weight.The importance of adequate sleep also discussed. Regular seat belt use and home safety, is also discussed. Changes in health habits are decided on by the patient with goals and time frames  set for achieving them. Immunization and cancer screening needs are specifically addressed at this visit.

## 2024-02-16 NOTE — Assessment & Plan Note (Addendum)
 Meloxicam  is prescribed, x rays and PT x 6 weeks, once weekly

## 2024-02-16 NOTE — Patient Instructions (Addendum)
 F/U in 22 weeks  Labs today CBC, lipid, cmp and EGFr, hBA1C, TSH and vit D  X ray of both shoulders next week and you are referred to PT for shoulder pain and stiffness  Meloxicam  is prescribed for shoulder pain ( not prednisone )  Great ongoing weight loss with change in diet keep it up!  Think about what you will eat, plan ahead. Choose  clean, green, fresh or frozen over canned, processed or packaged foods which are more sugary, salty and fatty. 70 to 75% of food eaten should be vegetables and fruit. Three meals at set times with snacks allowed between meals, but they must be fruit or vegetables. Aim to eat over a 12 hour period , example 7 am to 7 pm, and STOP after  your last meal of the day. Drink water,generally about 64 ounces per day, no other drink is as healthy. Fruit juice is best enjoyed in a healthy way, by EATING the fruit.   It is important that you exercise regularly at least 30 minutes 5 times a week. If you develop chest pain, have severe difficulty breathing, or feel very tired, stop exercising immediately and seek medical attention    Thanks for choosing [facility], we consider it a privelige to serve you.

## 2024-02-17 LAB — CBC WITH DIFFERENTIAL/PLATELET
Basophils Absolute: 0 x10E3/uL (ref 0.0–0.2)
Basos: 0 %
EOS (ABSOLUTE): 0.2 x10E3/uL (ref 0.0–0.4)
Eos: 3 %
Hematocrit: 43.5 % (ref 34.0–46.6)
Hemoglobin: 13.6 g/dL (ref 11.1–15.9)
Immature Grans (Abs): 0 x10E3/uL (ref 0.0–0.1)
Immature Granulocytes: 0 %
Lymphocytes Absolute: 1.9 x10E3/uL (ref 0.7–3.1)
Lymphs: 28 %
MCH: 27.5 pg (ref 26.6–33.0)
MCHC: 31.3 g/dL — ABNORMAL LOW (ref 31.5–35.7)
MCV: 88 fL (ref 79–97)
Monocytes Absolute: 0.7 x10E3/uL (ref 0.1–0.9)
Monocytes: 10 %
Neutrophils Absolute: 4 x10E3/uL (ref 1.4–7.0)
Neutrophils: 59 %
Platelets: 218 x10E3/uL (ref 150–450)
RBC: 4.94 x10E6/uL (ref 3.77–5.28)
RDW: 14 % (ref 11.7–15.4)
WBC: 6.8 x10E3/uL (ref 3.4–10.8)

## 2024-02-17 LAB — CMP14+EGFR
ALT: 7 IU/L (ref 0–32)
AST: 13 IU/L (ref 0–40)
Albumin: 4.5 g/dL (ref 3.8–4.8)
Alkaline Phosphatase: 61 IU/L (ref 44–121)
BUN/Creatinine Ratio: 23 (ref 12–28)
BUN: 18 mg/dL (ref 8–27)
Bilirubin Total: 0.8 mg/dL (ref 0.0–1.2)
CO2: 26 mmol/L (ref 20–29)
Calcium: 10.5 mg/dL — ABNORMAL HIGH (ref 8.7–10.3)
Chloride: 98 mmol/L (ref 96–106)
Creatinine, Ser: 0.77 mg/dL (ref 0.57–1.00)
Globulin, Total: 2.3 g/dL (ref 1.5–4.5)
Glucose: 85 mg/dL (ref 70–99)
Potassium: 4 mmol/L (ref 3.5–5.2)
Sodium: 137 mmol/L (ref 134–144)
Total Protein: 6.8 g/dL (ref 6.0–8.5)
eGFR: 82 mL/min/1.73 (ref >59)

## 2024-02-17 LAB — LIPID PANEL
Chol/HDL Ratio: 3.2 ratio (ref 0.0–4.4)
Cholesterol, Total: 247 mg/dL — ABNORMAL HIGH (ref 100–199)
HDL: 77 mg/dL (ref >39)
LDL Chol Calc (NIH): 161 mg/dL — ABNORMAL HIGH (ref 0–99)
Triglycerides: 57 mg/dL (ref 0–149)
VLDL Cholesterol Cal: 9 mg/dL (ref 5–40)

## 2024-02-17 LAB — VITAMIN D 25 HYDROXY (VIT D DEFICIENCY, FRACTURES): Vit D, 25-Hydroxy: 19.9 ng/mL — ABNORMAL LOW (ref 30.0–100.0)

## 2024-02-17 LAB — HEMOGLOBIN A1C
Est. average glucose Bld gHb Est-mCnc: 114 mg/dL
Hgb A1c MFr Bld: 5.6 % (ref 4.8–5.6)

## 2024-02-17 LAB — TSH: TSH: 1.06 u[IU]/mL (ref 0.450–4.500)

## 2024-02-18 ENCOUNTER — Ambulatory Visit: Payer: Self-pay | Admitting: Family Medicine

## 2024-02-18 NOTE — Assessment & Plan Note (Signed)

## 2024-02-20 ENCOUNTER — Encounter: Payer: 59 | Admitting: Family Medicine

## 2024-02-22 NOTE — Patient Instructions (Signed)
 Your procedure is scheduled on: 02/27/2024  Report to [facility] Main Entrance at  6:00   AM.  Call this number if you have problems the morning of surgery: [phone]   Remember:   Do not Eat or Drink after midnight         No Smoking the morning of surgery  :  Take these medicines the morning of surgery with A SIP OF WATER: Carvedilol   Use inhalers if needed   Do not wear jewelry, make-up or nail polish.  Do not wear lotions, powders, or perfumes. You may wear deodorant.  Do not shave 48 hours prior to surgery. Men may shave face and neck.  Do not bring valuables to the hospital.  Contacts, dentures or bridgework may not be worn into surgery.  Leave suitcase in the car. After surgery it may be brought to your room.  For patients admitted to the hospital, checkout time is 11:00 AM the day of discharge.   Patients discharged the day of surgery will not be allowed to drive home.    Special Instructions: Shower using CHG night before surgery and shower the day of surgery use CHG.  Use special wash - you have one bottle of CHG for all showers.  You should use approximately 1/2 of the bottle for each shower. How to Use Chlorhexidine at Home in the Shower Chlorhexidine gluconate (CHG) is a germ-killing (antiseptic) wash that's used to clean the skin. It can get rid of the germs that normally live on the skin and can keep them away for about 24 hours. If you're having surgery, you may be told to shower with CHG at home the night before surgery. This can help lower your risk for infection. To use CHG wash in the shower, follow the steps below. Supplies needed: CHG body wash. Clean washcloth. Clean towel. How to use CHG in the shower Follow these steps unless you're told to use CHG in a different way: Start the shower. Use your normal soap and shampoo to wash your face and hair. Turn off the shower or move out of the shower stream. Pour CHG onto a clean washcloth. Do not use any  type of brush or rough sponge. Start at your neck, washing your body down to your toes. Make sure you: Wash the part of your body where the surgery will be done for at least 1 minute. Do not scrub. Do not use CHG on your head or face unless your health care provider tells you to. If it gets into your ears or eyes, rinse them well with water. Do not wash your genitals with CHG. Wash your back and under your arms. Make sure to wash skin folds. Let the CHG sit on your skin for 1-2 minutes or as long as told. Rinse your entire body in the shower, including all body creases and folds. Turn off the shower. Dry off with a clean towel. Do not put anything on your skin afterward, such as powder, lotion, or perfume. Put on clean clothes or pajamas. If it's the night before surgery, sleep in clean sheets. General tips Use CHG only as told, and follow the instructions on the label. Use the full amount of CHG as told. This is often one bottle. Do not smoke and stay away from flames after using CHG. Your skin may feel sticky after using CHG. This is normal. The sticky feeling will go away as the CHG dries. Do not use CHG: If you have a  chlorhexidine allergy or have reacted to chlorhexidine in the past. On open wounds or areas of skin that have broken skin, cuts, or scrapes. On babies younger than 71 months of age. Contact a health care provider if: You have questions about using CHG. Your skin gets irritated or itchy. You have a rash after using CHG. You swallow any CHG. Call your local poison control center [phone] in the [place] Your eyes itch badly, or they become very red or swollen. Your hearing changes. You have trouble seeing. If you can't reach your provider, go to an urgent care or emergency room. Do not drive yourself. Get help right away if: You have swelling or tingling in your mouth or throat. You make high-pitched whistling sounds when you breathe, most often when you breathe out  (wheeze). You have trouble breathing. These symptoms may be an emergency. Call 911 right away. Do not wait to see if the symptoms will go away. Do not drive yourself to the hospital. This information is not intended to replace advice given to you by your health care provider. Make sure you discuss any questions you have with your health care provider. Document Revised: 12/13/2022 Document Reviewed: 12/09/2021 Elsevier Patient Education  2024 Else[employer] Inc. Removing Tissue From the Uterus (Dilation and Curettage): What to Know After After having a dilation and curettage to remove tissue from your uterus, it's common to have mild pain or cramps. You may also have some bleeding or spotting from the vagina. Follow these instructions at home: Activity If you were given a sedative, do not drive or use machines until you're told it's safe. A sedative can make you sleepy. Ask if it's OK for you to lift. Do not take baths, swim, or use a hot tub until you're told it's OK. Ask if you can shower. Ask what things are safe for you to do at home. Ask when you can go back to work or school. Rest as told. Get up to take short walks many times during the day. This helps you breathe better and keeps your blood flowing. Ask for help if you feel weak or unsteady. General instructions  Take your medicines only as told. If you're starting birth control after the procedure, ask your doctor when you should start it. Do not smoke, vape, or use nicotine or tobacco. Doing this can slow down healing. For as long as told, do not: Douche. Use tampons. Have sex. Follow up with your doctor as told. You may need to call or meet with your doctor to get your results. Your doctor may give you more instructions. Make sure you know what you can and can't do. Contact a doctor if: You have very bad cramps that get worse or do not get better with medicine. You have pain in your belly that's new or gets worse. You have fluid  from your vagina that smells bad. You feel dizzy or light-headed. Get help right away if: You are bleeding a lot from your vagina. This means soaking more than one sanitary pad in 1 hour, and this happens for 2 hours in a row. You pass large clots from your vagina. You have a fever. Your belly feels very tender or hard. You have chest pain. You have shortness of breath. You faint. These symptoms may be an emergency. Call 911 right away. Do not wait to see if the symptoms will go away. Do not drive yourself to the hospital. This information is not intended to replace advice given  to you by your health care provider. Make sure you discuss any questions you have with your health care provider. Document Revised: 03/17/2023 Document Reviewed: 03/17/2023 Elsevier Patient Education  2025 [employer]. Hysteroscopy Hysteroscopy is a procedure used to look inside a person's uterus. It should be done right after your period. You may have this procedure if your health care provider wants to: Look for tumors and other growths in the uterus. Know more about your bleeding, fibroids, tumors, polyps, scar tissue, or cancer in the uterus. Know why you aren't able to get pregnant, or why you lose your pregnancies. Find an intrauterine device (IUD) in your body. Put a birth control device in the fallopian tubes. Tell a health care provider about: Any allergies you have. All medicines you are taking. These include vitamins, herbs, eye drops, creams, and over-the-counter medicines. Any problems you or family members have had with anesthesia. Any bleeding problems you have. Any medical conditions or surgeries you've had. Whether you're pregnant or may be pregnant. Whether you have or have had a sexually transmitted infection (STI), or you think you have an STI. What are the risks? Your provider will talk with you about risks. These may include: Bleeding that's worse. Infection. Damage to parts of the  uterus or other organs. Allergies to medicines or fluids that are used in the procedure. What happens before the procedure? When to stop eating and drinking Follow instructions from your provider about what you may eat and drink. These may include: 8 hours before your procedure Stop eating most foods. Do not eat meat, fried foods, or fatty foods. Eat only light foods, such as toast or crackers. All liquids are okay except energy drinks and alcohol. 6 hours before your procedure Stop eating. Drink only clear liquids, such as water, clear fruit juice, black coffee, plain tea, and sports drinks. Do not drink energy drinks or alcohol. 2 hours before your procedure Stop drinking all liquids. You may be allowed to take medicines with small sips of water. If you do not follow your provider's instructions, your procedure may be delayed or canceled. Medicines Ask your provider about: Changing or stopping your regular medicines. These include any diabetes medicines or blood thinners you take. Taking medicines such as aspirin and ibuprofen. These medicines can thin your blood. Do not take them unless your provider tells you to. Taking over-the-counter medicines, vitamins, herbs, and supplements. Medicine may be placed in your cervix the day before the procedure. This medicine causes the cervix to open (dilate). The larger opening makes it easier for the hysteroscope to be inserted into the uterus. General instructions Ask your provider what steps will be taken to help prevent infection. These steps may include: Washing skin with a soap that kills germs. Taking antibiotic medicine. Do not smoke, vape, or use any products that have nicotine or tobacco for at least 4 weeks before the surgery. If you need help quitting, ask your provider. If you will be going home right after the procedure, plan to have an adult: Take you home from the hospital or clinic. You will not be allowed to drive. Care for you  for the time you are told. Pee before the procedure begins. What happens during the procedure? You may be given: A sedative. This helps you relax. Anesthesia. This keeps you from feeling pain. It will make you fall asleep for surgery. A small tube with a light and camera called a hysteroscope will be put into your uterus. The camera sends  images to a screen in the room so your provider can see the inside of your uterus. Air or fluid will be used to enlarge your uterus to allow your provider to see it better. In some cases, tissue may be gently scraped from inside the uterus and sent to a lab for testing (biopsy). The procedure may vary among providers and hospitals. What happens after the procedure? Your blood pressure, heart rate, breathing rate, and blood oxygen  level will be monitored until you leave the hospital or clinic. You may have cramps. You may be given medicines for this. You may have bleeding. Bleeding may be light or heavy. This is normal. If you had a biopsy, it is up to you to get the results. Ask your provider, or the department that is doing the procedure, when your results will be ready. This information is not intended to replace advice given to you by your health care provider. Make sure you discuss any questions you have with your health care provider. Document Revised: 09/30/2022 Document Reviewed: 09/30/2022 Elsevier Patient Education  2024 Elsevier Inc. General Anesthesia, Adult, Care After The following information offers guidance on how to care for yourself after your procedure. Your health care provider may also give you more specific instructions. If you have problems or questions, contact your health care provider. What can I expect after the procedure? After the procedure, it is common for people to: Have pain or discomfort at the IV site. Have nausea or vomiting. Have a sore throat or hoarseness. Have trouble concentrating. Feel cold or chills. Feel weak,  sleepy, or tired (fatigue). Have soreness and body aches. These can affect parts of the body that were not involved in surgery. Follow these instructions at home: For the time period you were told by your health care provider:  Rest. Do not participate in activities where you could fall or become injured. Do not drive or use machinery. Do not drink alcohol. Do not take sleeping pills or medicines that cause drowsiness. Do not make important decisions or sign legal documents. Do not take care of children on your own. General instructions Drink enough fluid to keep your urine pale yellow. If you have sleep apnea, surgery and certain medicines can increase your risk for breathing problems. Follow instructions from your health care provider about wearing your sleep device: Anytime you are sleeping, including during daytime naps. While taking prescription pain medicines, sleeping medicines, or medicines that make you drowsy. Return to your normal activities as told by your health care provider. Ask your health care provider what activities are safe for you. Take over-the-counter and prescription medicines only as told by your health care provider. Do not use any products that contain nicotine or tobacco. These products include cigarettes, chewing tobacco, and vaping devices, such as e-cigarettes. These can delay incision healing after surgery. If you need help quitting, ask your health care provider. Contact a health care provider if: You have nausea or vomiting that does not get better with medicine. You vomit every time you eat or drink. You have pain that does not get better with medicine. You cannot urinate or have bloody urine. You develop a skin rash. You have a fever. Get help right away if: You have trouble breathing. You have chest pain. You vomit blood. These symptoms may be an emergency. Get help right away. Call 911. Do not wait to see if the symptoms will go away. Do not  drive yourself to the hospital. Summary After the procedure,  it is common to have a sore throat, hoarseness, nausea, vomiting, or to feel weak, sleepy, or fatigue. For the time period you were told by your health care provider, do not drive or use machinery. Get help right away if you have difficulty breathing, have chest pain, or vomit blood. These symptoms may be an emergency. This information is not intended to replace advice given to you by your health care provider. Make sure you discuss any questions you have with your health care provider. Document Revised: 08/27/2021 Document Reviewed: 08/27/2021 Elsevier Patient Education  2024 [employer].

## 2024-02-23 ENCOUNTER — Encounter (HOSPITAL_COMMUNITY): Payer: Self-pay

## 2024-02-23 ENCOUNTER — Encounter (HOSPITAL_COMMUNITY)
Admission: RE | Admit: 2024-02-23 | Discharge: 2024-02-23 | Disposition: A | Discharge status: Home / Self Care | Source: Ambulatory Visit | Attending: Obstetrics & Gynecology | Admitting: Obstetrics & Gynecology

## 2024-02-23 ENCOUNTER — Other Ambulatory Visit: Payer: Self-pay

## 2024-02-23 VITALS — BP 156/83 | HR 61 | Temp 97.9°F | Resp 18 | Ht 64.0 in | Wt 236.1 lb

## 2024-02-23 DIAGNOSIS — I10 Essential (primary) hypertension: Secondary | ICD-10-CM | POA: Insufficient documentation

## 2024-02-23 DIAGNOSIS — Z01810 Encounter for preprocedural cardiovascular examination: Secondary | ICD-10-CM | POA: Diagnosis present

## 2024-02-23 HISTORY — DX: Cardiac murmur, unspecified: R01.1

## 2024-02-23 NOTE — H&P (Signed)
 [facility] Attending History and Physical  Brandy Dean is a 73 y.o. PM female who presents for hysteroscopy, D&C due to postmenopausal bleeding with inadequate in-office EMB.  In review, pt notes daily light pink spotting for several months.  Bleeding barely requires pantyliner, mostly seen when she wipes.  Denies pelvic or abdominal pain.  In office EMB completed with collection of minimal sample and recommendation for repeat sampling.  Today she notes that she is still having some light spotting.    Denies any fevers, chills, sweats, dysuria, nausea, vomiting, other GI or GU symptoms or other general symptoms.  Past Medical History:  Diagnosis Date   Bacterial pneumonia    March  9-12,  2011   Bronchial asthma    Essential hypertension    Heart murmur    Hyperlipidemia    Morbid obesity [facility])    Past Surgical History:  Procedure Laterality Date   CESAREAN SECTION  1988 & 1991   Partial left lobectomy  1968   RIght and left keloid ears  1968   OB History  Gravida Para Term Preterm AB Living  3 3 3      SAB IAB Ectopic Multiple Live Births          # Outcome Date GA Lbr Len/2nd Weight Sex Type Anes PTL Lv  3 Term 1991     CS-Unspec     2 Term 81     CS-LTranv     1 Term 47     Vag-Spont     Patient denies any other pertinent gynecologic issues.  No current facility-administered medications on file prior to encounter.   Current Outpatient Medications on File Prior to Encounter  Medication Sig Dispense Refill   acetaminophen  (TYLENOL ) 500 MG tablet Take 500-1,000 mg by mouth every 6 (six) hours as needed for moderate pain (pain score 4-6).     BREZTRI  AEROSPHERE 160-9-4.8 MCG/ACT AERO inhaler INHALE 2 PUFFS INTO THE LUNGS TWICE DAILY 10.7 g 11   carvedilol  (COREG ) 3.125 MG tablet Take 1 tablet (3.125 mg total) by mouth 2 (two) times daily with a meal. 60 tablet 3   Cholecalciferol (VITAMIN D3 PO) Take 1 tablet by mouth daily.      clotrimazole -betamethasone  (LOTRISONE ) cream APPLY TOPICALLY TO THE AFFECTED AREA TWICE DAILY (Patient taking differently: Apply 1 Application topically 2 (two) times daily as needed (Irritation).) 45 g 1   triamterene -hydrochlorothiazide (MAXZIDE) 75-50 MG tablet TAKE 1 TABLET BY MOUTH DAILY 90 tablet 1   Allergies  Allergen Reactions   Benzonatate Shortness Of Breath    TRIGGERS ASTHMA ATTACK    Social History:   reports that she quit smoking about 16 years ago. Her smoking use included cigarettes. She has never used smokeless tobacco. She reports current alcohol use. She reports that she does not use drugs. Family History  Problem Relation Age of Onset   Stroke Mother    Hypertension Mother    Esophageal cancer Father    Obesity Sister     Review of Systems: Pertinent items noted in HPI and remainder of comprehensive ROS otherwise negative.  PHYSICAL EXAM: Blood pressure 118/81, pulse (!) 57, temperature 98.3 F (36.8 C), resp. rate 20, height 5' 4 (1.626 m), weight 107.1 kg, SpO2 97%. CONSTITUTIONAL: Well-developed, well-nourished female in no acute distress.  SKIN: Skin is warm and dry. No rash noted. Not diaphoretic. No erythema. No pallor. NEUROLOGIC: Alert and oriented to person, place, and time. Normal reflexes, muscle tone coordination.  No cranial nerve deficit noted. PSYCHIATRIC: Normal mood and affect. Normal behavior. Normal judgment and thought content. CARDIOVASCULAR: Normal heart rate noted, regular rhythm RESPIRATORY: Effort and breath sounds normal, no problems with respiration noted ABDOMEN: Soft, nontender, nondistended. PELVIC: deferred MUSCULOSKELETAL: no calf tenderness bilaterally EXT: no edema bilaterally, normal pulses  Labs: Results for orders placed or performed in visit on 02/16/24 (from the past 2 weeks)  CBC with Differential/Platelet   Collection Time: 02/16/24  4:13 PM  Result Value Ref Range   WBC 6.8 3.4 - 10.8 x10E3/uL   RBC 4.94 3.77 -  5.28 x10E6/uL   Hemoglobin 13.6 11.1 - 15.9 g/dL   Hematocrit 56.4 65.9 - 46.6 %   MCV 88 79 - 97 fL   MCH 27.5 26.6 - 33.0 pg   MCHC 31.3 (L) 31.5 - 35.7 g/dL   RDW 85.9 88.2 - 84.5 %   Platelets 218 150 - 450 x10E3/uL   Neutrophils 59 Not Estab. %   Lymphs 28 Not Estab. %   Monocytes 10 Not Estab. %   Eos 3 Not Estab. %   Basos 0 Not Estab. %   Neutrophils Absolute 4.0 1.4 - 7.0 x10E3/uL   Lymphocytes Absolute 1.9 0.7 - 3.1 x10E3/uL   Monocytes Absolute 0.7 0.1 - 0.9 x10E3/uL   EOS (ABSOLUTE) 0.2 0.0 - 0.4 x10E3/uL   Basophils Absolute 0.0 0.0 - 0.2 x10E3/uL   Immature Granulocytes 0 Not Estab. %   Immature Grans (Abs) 0.0 0.0 - 0.1 x10E3/uL  Lipid panel   Collection Time: 02/16/24  4:13 PM  Result Value Ref Range   Cholesterol, Total 247 (H) 100 - 199 mg/dL   Triglycerides 57 0 - 149 mg/dL   HDL 77 >60 mg/dL   VLDL Cholesterol Cal 9 5 - 40 mg/dL   LDL Chol Calc (NIH) 838 (H) 0 - 99 mg/dL   Chol/HDL Ratio 3.2 0.0 - 4.4 ratio  CMP14+EGFR   Collection Time: 02/16/24  4:13 PM  Result Value Ref Range   Glucose 85 70 - 99 mg/dL   BUN 18 8 - 27 mg/dL   Creatinine, Ser 9.22 0.57 - 1.00 mg/dL   eGFR 82 >40 fO/fpw/8.26   BUN/Creatinine Ratio 23 12 - 28   Sodium 137 134 - 144 mmol/L   Potassium 4.0 3.5 - 5.2 mmol/L   Chloride 98 96 - 106 mmol/L   CO2 26 20 - 29 mmol/L   Calcium  10.5 (H) 8.7 - 10.3 mg/dL   Total Protein 6.8 6.0 - 8.5 g/dL   Albumin 4.5 3.8 - 4.8 g/dL   Globulin, Total 2.3 1.5 - 4.5 g/dL   Bilirubin Total 0.8 0.0 - 1.2 mg/dL   Alkaline Phosphatase 61 44 - 121 IU/L   AST 13 0 - 40 IU/L   ALT 7 0 - 32 IU/L  Hemoglobin A1c   Collection Time: 02/16/24  4:13 PM  Result Value Ref Range   Hgb A1c MFr Bld 5.6 4.8 - 5.6 %   Est. average glucose Bld gHb Est-mCnc 114 mg/dL  TSH   Collection Time: 02/16/24  4:13 PM  Result Value Ref Range   TSH 1.060 0.450 - 4.500 uIU/mL  VITAMIN D  25 Hydroxy (Vit-D Deficiency, Fractures)   Collection Time: 02/16/24  4:13 PM   Result Value Ref Range   Vit D, 25-Hydroxy 19.9 (L) 30.0 - 100.0 ng/mL    Imaging Studies: 10x3 x 4.6 x 5.4cm uteurs- .  No fibroids or masses.  Endometrial lining 5mm.  Normal  right ovary, left ovary not seen.  Assessment: Postmenopausal bleeding   Plan: Hysteroscopy, D&C  -NPO -LR @ 125cc/hr -SCDs to OR -Risk/benefits and alternatives reviewed with the patient including but not limited to risk of bleeding, infection and injury to surrounding organs due to uterine perforation requiring further surgical intervention.  Questions and concerns were addressed and pt desires to proceed.  Briele Lagasse, DO Attending Obstetrician & Gynecologist, [facility] for [employer], [facility]

## 2024-02-23 NOTE — Progress Notes (Signed)
 I sent secure chat to Dr.Lutz to review EKG from today's PAT visit. No new orders given.

## 2024-02-26 ENCOUNTER — Ambulatory Visit (HOSPITAL_COMMUNITY): Admitting: Occupational Therapy

## 2024-02-27 ENCOUNTER — Ambulatory Visit (HOSPITAL_COMMUNITY)
Admission: RE | Admit: 2024-02-27 | Discharge: 2024-02-27 | Disposition: A | Discharge status: Home / Self Care | Attending: Obstetrics & Gynecology | Admitting: Obstetrics & Gynecology

## 2024-02-27 ENCOUNTER — Ambulatory Visit (HOSPITAL_BASED_OUTPATIENT_CLINIC_OR_DEPARTMENT_OTHER): Payer: Self-pay | Admitting: Anesthesiology

## 2024-02-27 ENCOUNTER — Encounter (HOSPITAL_COMMUNITY): Payer: Self-pay | Admitting: Anesthesiology

## 2024-02-27 ENCOUNTER — Encounter (HOSPITAL_COMMUNITY)
Admission: RE | Disposition: A | Discharge status: Home / Self Care | Payer: Self-pay | Source: Home / Self Care | Attending: Obstetrics & Gynecology

## 2024-02-27 ENCOUNTER — Encounter (HOSPITAL_COMMUNITY): Payer: Self-pay | Admitting: Obstetrics & Gynecology

## 2024-02-27 DIAGNOSIS — C541 Malignant neoplasm of endometrium: Secondary | ICD-10-CM | POA: Insufficient documentation

## 2024-02-27 DIAGNOSIS — N950 Postmenopausal bleeding: Secondary | ICD-10-CM | POA: Diagnosis present

## 2024-02-27 DIAGNOSIS — R9389 Abnormal findings on diagnostic imaging of other specified body structures: Secondary | ICD-10-CM

## 2024-02-27 DIAGNOSIS — E6689 Other obesity not elsewhere classified: Secondary | ICD-10-CM | POA: Diagnosis not present

## 2024-02-27 DIAGNOSIS — Z8249 Family history of ischemic heart disease and other diseases of the circulatory system: Secondary | ICD-10-CM | POA: Diagnosis not present

## 2024-02-27 DIAGNOSIS — Z6841 Body Mass Index (BMI) 40.0 and over, adult: Secondary | ICD-10-CM | POA: Insufficient documentation

## 2024-02-27 DIAGNOSIS — J45909 Unspecified asthma, uncomplicated: Secondary | ICD-10-CM | POA: Insufficient documentation

## 2024-02-27 DIAGNOSIS — N9489 Other specified conditions associated with female genital organs and menstrual cycle: Secondary | ICD-10-CM

## 2024-02-27 DIAGNOSIS — Z87891 Personal history of nicotine dependence: Secondary | ICD-10-CM | POA: Diagnosis not present

## 2024-02-27 DIAGNOSIS — I10 Essential (primary) hypertension: Secondary | ICD-10-CM | POA: Diagnosis not present

## 2024-02-27 DIAGNOSIS — D070 Carcinoma in situ of endometrium: Secondary | ICD-10-CM

## 2024-02-27 HISTORY — DX: Other specified conditions associated with female genital organs and menstrual cycle: N94.89

## 2024-02-27 HISTORY — PX: HYSTEROSCOPY WITH D & C: SHX1775

## 2024-02-27 SURGERY — DILATATION AND CURETTAGE /HYSTEROSCOPY
Anesthesia: General | Site: Vagina

## 2024-02-27 MED ORDER — FENTANYL CITRATE (PF) 100 MCG/2ML IJ SOLN
INTRAMUSCULAR | Status: DC | PRN
  Administered 2024-02-27: 50 ug via INTRAVENOUS

## 2024-02-27 MED ORDER — EPHEDRINE SULFATE-NACL 50-0.9 MG/10ML-% IV SOSY
PREFILLED_SYRINGE | INTRAVENOUS | Status: DC | PRN
  Administered 2024-02-27 (×2): 5 mg via INTRAVENOUS

## 2024-02-27 MED ORDER — ALBUTEROL SULFATE HFA 108 (90 BASE) MCG/ACT IN AERS
INHALATION_SPRAY | RESPIRATORY_TRACT | Status: AC
  Filled 2024-02-27: qty 6.7

## 2024-02-27 MED ORDER — PROPOFOL 10 MG/ML IV BOLUS
INTRAVENOUS | Status: AC
  Filled 2024-02-27: qty 20

## 2024-02-27 MED ORDER — CHLORHEXIDINE GLUCONATE 0.12 % MT SOLN
15.0000 mL | Freq: Once | OROMUCOSAL | Status: AC
  Administered 2024-02-27: 15 mL via OROMUCOSAL

## 2024-02-27 MED ORDER — FENTANYL CITRATE PF 50 MCG/ML IJ SOSY: 25.0000 ug | PREFILLED_SYRINGE | INTRAMUSCULAR | Status: DC | PRN

## 2024-02-27 MED ORDER — ONDANSETRON HCL 4 MG/2ML IJ SOLN
INTRAMUSCULAR | Status: DC | PRN
  Administered 2024-02-27: 4 mg via INTRAVENOUS

## 2024-02-27 MED ORDER — PROPOFOL 10 MG/ML IV BOLUS
INTRAVENOUS | Status: DC | PRN
Start: 2024-02-27 — End: 2024-02-27
  Administered 2024-02-27: 50 mg via INTRAVENOUS
  Administered 2024-02-27: 150 mg via INTRAVENOUS

## 2024-02-27 MED ORDER — EPHEDRINE 5 MG/ML INJ
INTRAVENOUS | Status: AC
  Filled 2024-02-27: qty 5

## 2024-02-27 MED ORDER — ONDANSETRON HCL 4 MG/2ML IJ SOLN: 4.0000 mg | Freq: Once | INTRAMUSCULAR | Status: DC | PRN

## 2024-02-27 MED ORDER — LIDOCAINE-EPINEPHRINE 0.5 %-1:200000 IJ SOLN
INTRAMUSCULAR | Status: DC | PRN
  Administered 2024-02-27: 20 mL

## 2024-02-27 MED ORDER — LACTATED RINGERS IV SOLN: INTRAVENOUS | Status: DC

## 2024-02-27 MED ORDER — GLYCOPYRROLATE PF 0.2 MG/ML IJ SOSY
PREFILLED_SYRINGE | INTRAMUSCULAR | Status: DC | PRN
  Administered 2024-02-27: .2 mg via INTRAVENOUS

## 2024-02-27 MED ORDER — FENTANYL CITRATE (PF) 100 MCG/2ML IJ SOLN
INTRAMUSCULAR | Status: AC
  Filled 2024-02-27: qty 2

## 2024-02-27 MED ORDER — OXYCODONE HCL 5 MG/5ML PO SOLN: 5.0000 mg | Freq: Once | ORAL | Status: DC | PRN

## 2024-02-27 MED ORDER — POVIDONE-IODINE 10 % EX SWAB
2.0000 | Freq: Once | CUTANEOUS | Status: AC
  Administered 2024-02-27: 2 via TOPICAL

## 2024-02-27 MED ORDER — KETOROLAC TROMETHAMINE 15 MG/ML IJ SOLN
15.0000 mg | INTRAMUSCULAR | Status: AC
  Administered 2024-02-27: 15 mg via INTRAVENOUS
  Filled 2024-02-27: qty 1

## 2024-02-27 MED ORDER — OXYCODONE HCL 5 MG PO TABS: 5.0000 mg | ORAL_TABLET | Freq: Once | ORAL | Status: DC | PRN

## 2024-02-27 MED ORDER — DEXMEDETOMIDINE HCL IN NACL 80 MCG/20ML IV SOLN
INTRAVENOUS | Status: AC
  Filled 2024-02-27: qty 20

## 2024-02-27 MED ORDER — GLYCOPYRROLATE PF 0.2 MG/ML IJ SOSY
PREFILLED_SYRINGE | INTRAMUSCULAR | Status: AC
Start: 2024-02-27 — End: 2024-02-27
  Filled 2024-02-27: qty 2

## 2024-02-27 MED ORDER — CHLORHEXIDINE GLUCONATE 0.12 % MT SOLN: 15.0000 mL | Freq: Once | OROMUCOSAL | Status: DC

## 2024-02-27 MED ORDER — ONDANSETRON HCL 4 MG/2ML IJ SOLN
INTRAMUSCULAR | Status: AC
  Filled 2024-02-27: qty 2

## 2024-02-27 MED ORDER — LIDOCAINE-EPINEPHRINE 0.5 %-1:200000 IJ SOLN
INTRAMUSCULAR | Status: AC
Start: 2024-02-27 — End: 2024-02-27
  Filled 2024-02-27: qty 50

## 2024-02-27 MED ORDER — ORAL CARE MOUTH RINSE: 15.0000 mL | Freq: Once | OROMUCOSAL | Status: DC

## 2024-02-27 MED ORDER — ALBUTEROL SULFATE HFA 108 (90 BASE) MCG/ACT IN AERS
INHALATION_SPRAY | RESPIRATORY_TRACT | Status: DC | PRN
  Administered 2024-02-27: 3 via RESPIRATORY_TRACT

## 2024-02-27 MED ORDER — DEXMEDETOMIDINE HCL IN NACL 80 MCG/20ML IV SOLN
INTRAVENOUS | Status: DC | PRN
  Administered 2024-02-27: 8 ug via INTRAVENOUS

## 2024-02-27 MED ORDER — DEXAMETHASONE SODIUM PHOSPHATE 10 MG/ML IJ SOLN
INTRAMUSCULAR | Status: AC
  Filled 2024-02-27: qty 1

## 2024-02-27 MED ORDER — LIDOCAINE 2% (20 MG/ML) 5 ML SYRINGE
INTRAMUSCULAR | Status: DC | PRN
  Administered 2024-02-27: 50 mg via INTRAVENOUS

## 2024-02-27 MED ORDER — DEXAMETHASONE SODIUM PHOSPHATE 10 MG/ML IJ SOLN
INTRAMUSCULAR | Status: DC | PRN
  Administered 2024-02-27: 5 mg via INTRAVENOUS

## 2024-02-27 MED ORDER — SODIUM CHLORIDE 0.9 % IR SOLN
Status: DC | PRN
  Administered 2024-02-27: 3000 mL
  Administered 2024-02-27: 500 mL

## 2024-02-27 SURGICAL SUPPLY — 25 items
COVER LIGHT HANDLE (MISCELLANEOUS) IMPLANT
GAUZE 4X4 16PLY ~~LOC~~+RFID DBL (SPONGE) ×4 IMPLANT
GLOVE BIO SURGEON STRL SZ 6.5 (GLOVE) ×2 IMPLANT
GLOVE BIOGEL PI IND STRL 7.0 (GLOVE) ×6 IMPLANT
GOWN STRL REUS W/ TWL LRG LVL3 (GOWN DISPOSABLE) ×2 IMPLANT
GOWN STRL REUS W/TWL LRG LVL3 (GOWN DISPOSABLE) ×2 IMPLANT
KIT PROCEDURE FLUENT (KITS) ×2 IMPLANT
KIT TURNOVER CYSTO (KITS) ×2 IMPLANT
KIT TURNOVER KIT A (KITS) ×2 IMPLANT
NDL HYPO 18GX1.5 BLUNT FILL (NEEDLE) ×2 IMPLANT
NEEDLE HYPO 18GX1.5 BLUNT FILL (NEEDLE) ×1 IMPLANT
NS IRRIG 1000ML POUR BTL (IV SOLUTION) ×2 IMPLANT
PACK PERI GYN (CUSTOM PROCEDURE TRAY) ×2 IMPLANT
PAD ARMBOARD POSITIONER FOAM (MISCELLANEOUS) ×2 IMPLANT
PAD TELFA 3X4 1S STER (GAUZE/BANDAGES/DRESSINGS) ×2 IMPLANT
POSITIONER HEAD 8X9X4 ADT (SOFTGOODS) ×2 IMPLANT
SEAL ROD LENS SCOPE MYOSURE (ABLATOR) ×2 IMPLANT
SET BASIN LINEN APH (SET/KITS/TRAYS/PACK) ×2 IMPLANT
SOL .9 NS 3000ML IRR UROMATIC (IV SOLUTION) ×2 IMPLANT
SOL PREP POV-IOD 4OZ 10% (MISCELLANEOUS) ×2 IMPLANT
SYR 30ML LL (SYRINGE) ×2 IMPLANT
SYR CONTROL 10ML LL (SYRINGE) ×2 IMPLANT
SYSTEM TISS REMOVAL MYOSURE XL (MISCELLANEOUS) IMPLANT
TOWEL OR 17X26 4PK STRL BLUE (TOWEL DISPOSABLE) ×2 IMPLANT
UNDERPAD 30X36 HEAVY ABSORB (UNDERPADS AND DIAPERS) ×2 IMPLANT

## 2024-02-27 NOTE — Anesthesia Postprocedure Evaluation (Signed)
 Anesthesia Post Note  Patient: BESAN KETCHEM  Procedure(s) Performed: DILATATION AND CURETTAGE /HYSTEROSCOPY (Vagina )  Patient location during evaluation: Phase II Anesthesia Type: General Level of consciousness: awake Pain management: pain level controlled Vital Signs Assessment: post-procedure vital signs reviewed and stable Respiratory status: spontaneous breathing and respiratory function stable Cardiovascular status: blood pressure returned to baseline and stable Postop Assessment: no headache and no apparent nausea or vomiting Anesthetic complications: no Comments: Late entry   No notable events documented.   Last Vitals:  Vitals:   02/27/24 0845 02/27/24 0850  BP: 93/72 102/84  Pulse: (!) 54 (!) 52  Resp: 14 15  Temp: (!) 36.2 C (!) 36.3 C  SpO2: 99% 93%    Last Pain:  Vitals:   02/27/24 0850  PainSc: 0-No pain                 Yvonna JINNY Bosworth

## 2024-02-27 NOTE — Discharge Instructions (Addendum)
 HOME INSTRUCTIONS  Please note any unusual or excessive bleeding, pain, swelling. Mild dizziness or drowsiness are normal for about 24 hours after surgery.   Shower when comfortable  Restrictions: No driving for 24 hours or while taking pain medications.  Activity:  No heavy lifting (> 25 lbs), nothing in vagina (no tampons, douching, or intercourse) x 2 weeks; no tub baths for 2 weeks Vaginal spotting is expected but if your bleeding is heavy, period like,  please call the office   Diet:  You may return to your regular diet.  Do not eat large meals.  Eat small frequent meals throughout the day.  Continue to drink a good amount of water at least 6-8 glasses of water per day, hydration is very important for the healing process.  Pain Management: Take over the counter tylenol  or ibuprofen as needed for pain.  You can either take one or alternate between the two medications for pain management.  You may also use a heating pack as needed.    Alcohol -- Avoid for 24 hours and while taking pain medications.  Nausea: Take sips of ginger ale or soda  Fever -- Call physician if temperature over 101 degrees  Follow up:  Should you note heavy bleeding- soaking through a pad in under an hour, please call the office [phone].  If you experience fever (a temperature greater than 100.4), pain unrelieved by pain medication, shortness of breath, swelling of a single leg, or any other symptoms which are concerning to you please the office immediately.

## 2024-02-27 NOTE — Anesthesia Preprocedure Evaluation (Signed)
 Anesthesia Evaluation  Patient identified by MRN, date of birth, ID band Patient awake    Reviewed: Allergy & Precautions, H&P , NPO status , Patient's Chart, lab work & pertinent test results, reviewed documented beta blocker date and time   Airway Mallampati: II  TM Distance: >3 FB Neck ROM: full    Dental no notable dental hx.    Pulmonary asthma , pneumonia, former smoker   Pulmonary exam normal breath sounds clear to auscultation       Cardiovascular Exercise Tolerance: Good hypertension, + Valvular Problems/Murmurs  Rhythm:regular Rate:Normal     Neuro/Psych negative neurological ROS  negative psych ROS   GI/Hepatic negative GI ROS, Neg liver ROS,,,  Endo/Other    Class 4 obesity  Renal/GU negative Renal ROS  negative genitourinary   Musculoskeletal   Abdominal   Peds  Hematology negative hematology ROS (+)   Anesthesia Other Findings   Reproductive/Obstetrics negative OB ROS                              Anesthesia Physical Anesthesia Plan  ASA: 2  Anesthesia Plan: General   Post-op Pain Management:    Induction:   PONV Risk Score and Plan: Ondansetron   Airway Management Planned:   Additional Equipment:   Intra-op Plan:   Post-operative Plan:   Informed Consent: I have reviewed the patients History and Physical, chart, labs and discussed the procedure including the risks, benefits and alternatives for the proposed anesthesia with the patient or authorized representative who has indicated his/her understanding and acceptance.     Dental Advisory Given  Plan Discussed with: CRNA  Anesthesia Plan Comments:         Anesthesia Quick Evaluation

## 2024-02-27 NOTE — Op Note (Signed)
 Operative Report  PreOp: postmenopausal bleeding PostOp: same Procedure:  Hysteroscopy, Dilation and Curettage, Myosure resection Surgeon: Dr. Delon Prude Anesthesia: General Complications:none EBL: 10cc IVF:800cc Discrepancy: 500cc  Findings: 9cm uterus with thickened endometrium- significant blood and tissue noted making visualization challenging.  Questionable intracavitary fibroid also noted.  Both ostia visualized with some difficulty.  Specimens: endometrial curetting  Procedure: The patient was taken to the operating room where she underwent general anesthesia without difficulty. The patient was placed in a low lithotomy position using Allen stirrups. She was then prepped and draped in the normal sterile fashion. Sterile speculum was placed.  A single tooth tenaculum was placed on the anterior lip of the cervix. Cervical block was completed using 0.5% lidocaine  with epinephrine .   The uterus was then sounded to 9cm. The endocervical canal was then serially dilated to 25 [place] using Hank dilators to accommodate the hydrothermal ablation hysteroscopic apparatus.  The hysteroscope was inserted and findings were visualized as noted above.  The hysteroscope was removed and sharp curettage was performed.  The hysteroscope with Myosure was then reinserted for further resection of both endometrial tissue and sampling of endometrial mass.  Due to rising discrepancy, the entire endometrial mass could not be resected. The tissue was sent to pathology.   No uterine perforations were seen and fundal wall of uterus was well visualized. All instrument were then removed. Hemostasis was observed at the cervical site. The patient was repositioned to the supine position. The patient tolerated the procedure without any complications and taken to recovery in stable condition.   Dalana Pfahler, DO Attending Obstetrician & Gynecologist, [facility] for [employer], [facility]

## 2024-02-27 NOTE — Transfer of Care (Addendum)
 Immediate Anesthesia Transfer of Care Note  Patient: Brandy Dean  Procedure(s) Performed: DILATATION AND CURETTAGE /HYSTEROSCOPY (Vagina )  Patient Location: PACU  Anesthesia Type:General  Level of Consciousness: awake and patient cooperative  Airway & Oxygen  Therapy: Patient Spontanous Breathing  Post-op Assessment: Report given to RN and Post -op Vital signs reviewed and stable  Post vital signs: Reviewed and stable  Last Vitals:  Vitals Value Taken Time  BP 103/73 09*16/25  0829  Temp 36.6 C 02/27/24 08:26  Pulse 60 02/27/24 08:26  Resp 19 02/27/24 08:26  SpO2 100 % 02/27/24 08:26  Vitals shown include unfiled device data.  Last Pain:  Vitals:   02/27/24 0714  PainSc: 0-No pain         Complications: No notable events documented.

## 2024-02-27 NOTE — Anesthesia Procedure Notes (Signed)
 Procedure Name: LMA Insertion Date/Time: 02/27/2024 7:40 AM  Performed by: Barbarann Verneita RAMAN, CRNAPre-anesthesia Checklist: Patient identified, Patient being monitored, Emergency Drugs available, Timeout performed and Suction available Patient Re-evaluated:Patient Re-evaluated prior to induction Oxygen  Delivery Method: Circle System Utilized Preoxygenation: Pre-oxygenation with 100% oxygen  Induction Type: IV induction Ventilation: Mask ventilation without difficulty LMA: LMA inserted LMA Size: 4.0 Number of attempts: 1 Placement Confirmation: positive ETCO2 and breath sounds checked- equal and bilateral Tube secured with: Tape Dental Injury: Teeth and Oropharynx as per pre-operative assessment

## 2024-02-27 NOTE — Progress Notes (Signed)
 Small amount of blood trickled from vagina when patient ambulated.

## 2024-02-28 ENCOUNTER — Encounter (HOSPITAL_COMMUNITY): Payer: Self-pay | Admitting: Obstetrics & Gynecology

## 2024-02-29 ENCOUNTER — Encounter (HOSPITAL_COMMUNITY): Admitting: Occupational Therapy

## 2024-02-29 ENCOUNTER — Other Ambulatory Visit: Payer: Self-pay | Admitting: Obstetrics & Gynecology

## 2024-02-29 DIAGNOSIS — C541 Malignant neoplasm of endometrium: Secondary | ICD-10-CM

## 2024-02-29 LAB — SURGICAL PATHOLOGY

## 2024-02-29 NOTE — Progress Notes (Signed)
 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  Final report still pending  Blayde Bacigalupi, DO Attending Obstetrician & Gynecologist, [facility] for [employer], [facility]

## 2024-03-01 ENCOUNTER — Ambulatory Visit: Payer: Self-pay | Admitting: Obstetrics & Gynecology

## 2024-03-01 ENCOUNTER — Telehealth: Payer: Self-pay | Admitting: *Deleted

## 2024-03-01 NOTE — Telephone Encounter (Signed)
 LMOM for the patient at 8:30 am and 1:15 pm for the patient to call the office back for a new patient aappt on 9/22. Patient didn't call the office back.   Patient to scheduled for a new patient appt on 10/3 with the possibility of the appt being moved up to an early date

## 2024-03-04 ENCOUNTER — Other Ambulatory Visit: Payer: Self-pay | Admitting: Family Medicine

## 2024-03-04 ENCOUNTER — Telehealth: Payer: Self-pay

## 2024-03-04 ENCOUNTER — Telehealth: Payer: Self-pay | Admitting: Obstetrics & Gynecology

## 2024-03-04 DIAGNOSIS — I10 Essential (primary) hypertension: Secondary | ICD-10-CM

## 2024-03-04 NOTE — Telephone Encounter (Signed)
 Spoke with Brandy Dean regarding her referral to GYN oncology. She has an appointment scheduled with Dr. Viktoria on 03/15/24 at 11:15. Patient agrees to date and time. She has been provided with office address and location. She is also aware of our mask and visitor policy. Patient verbalized understanding and will call with any questions.

## 2024-03-04 NOTE — Telephone Encounter (Signed)
 Pt is scheduled on 10/3 @ 11:15. She agrees to date/time

## 2024-03-04 NOTE — Telephone Encounter (Signed)
 Patient called stating that she can't remember everything you said about her test results and wants to know if you would call her

## 2024-03-04 NOTE — Telephone Encounter (Signed)
 Copied from CRM #[id number]. Topic: Clinical - Medication Refill >> Mar 04, 2024  9:27 AM Carlatta H wrote: Medication: triamterene -hydrochlorothiazide (MAXZIDE) 75-50 MG tablet  Has the patient contacted their pharmacy? No (Agent: If no, request that the patient contact the pharmacy for the refill. If patient does not wish to contact the pharmacy document the reason why and proceed with request.) (Agent: If yes, when and what did the pharmacy advise?)  This is the patient's preferred pharmacy:    [facility] [phone] - [place], [place] - [address] AT [facility] OF [address] [place] [address] Phone: [phone] Fax: [phone]  Is this the correct pharmacy for this prescription? Yes If no, delete pharmacy and type the correct one.   Has the prescription been filled recently? No  Is the patient out of the medication? Yes  Has the patient been seen for an appointment in the last year OR does the patient have an upcoming appointment? Yes  Can we respond through MyChart? No  Agent: Please be advised that Rx refills may take up to 3 business days. We ask that you follow-up with your pharmacy.

## 2024-03-07 NOTE — Telephone Encounter (Signed)
 Pt called and reviewed her questions and concerns

## 2024-03-13 ENCOUNTER — Encounter: Payer: Self-pay | Admitting: Gynecologic Oncology

## 2024-03-13 NOTE — H&P (View-Only) (Signed)
 GYNECOLOGIC ONCOLOGY NEW PATIENT CONSULTATION   Patient Name: Brandy Dean  Patient Age: 73 y.o. Date of Service: 03/15/24 Referring Provider: [name], Dean  Primary Care Provider: [name], Dean Consulting Provider: Comer Dollar, Dean   Assessment/Plan:  Postmenopausal patient with high-grade endometrial cancer.  We reviewed the nature of endometrial cancer and its recommended surgical staging, including total hysterectomy, bilateral salpingo-oophorectomy, and lymph node assessment. The patient is a suitable candidate for staging via a minimally invasive approach to surgery.  We reviewed that robotic assistance would be used to complete the surgery.   We discussed that most endometrial cancer is detected early, however, we adjuvant therapy will likely be recommended based on the patient's biopsy. We will defer these decisions until we have final pathology results.    Given her high risk histology, I recommend CT scan preoperatively to rule out metastatic disease.  We reviewed the sentinel lymph node technique. Risks and benefits of sentinel lymph node biopsy was reviewed. We reviewed the technique and ICG dye. The patient DOES NOT have an iodine  allergy or known liver dysfunction. We reviewed the false negative rate (0.4%), and that 3% of patients with metastatic disease will not have it detected by SLN biopsy in endometrial cancer. A low risk of allergic reaction to the dye, <0.2% for ICG, has been reported. We also discussed that in the case of failed mapping, which occurs 40% of the time, a bilateral or unilateral lymphadenectomy will be performed at the surgeon's discretion.   Potential benefits of sentinel nodes including a higher detection rate for metastasis due to ultrastaging and potential reduction in operative morbidity. However, there remains uncertainty as to the role for treatment of micrometastatic disease. Further, the benefit of operative morbidity associated  with the SLN technique in endometrial cancer is not yet completely known. In other patient populations (e.g. the cervical cancer population) there has been observed reductions in morbidity with SLN biopsy compared to pelvic lymphadenectomy. Lymphedema, nerve dysfunction and lymphocysts are all potential risks with the SLN technique as with complete lymphadenectomy. Additional risks to the patient include the risk of damage to an internal organ while operating in an altered view (e.g. the black and white image of the robotic fluorescence imaging mode).   We discussed the plan for a robotic assisted hysterectomy, bilateral salpingo-oophorectomy, sentinel lymph node evaluation, possible lymph node dissection, possible omental biopsy, possible laparotomy. The risks of surgery were discussed in detail and she understands these to include infection; wound separation; hernia; vaginal cuff separation, injury to adjacent organs such as bowel, bladder, blood vessels, ureters and nerves; bleeding which may require blood transfusion; anesthesia risk; thromboembolic events; possible death; unforeseen complications; possible need for re-exploration; medical complications such as heart attack, stroke, pleural effusion and pneumonia; and, if full lymphadenectomy is performed the risk of lymphedema and lymphocyst. The patient will receive DVT and antibiotic prophylaxis as indicated. She voiced a clear understanding. She had the opportunity to ask questions. Perioperative instructions were reviewed with her. Prescriptions for post-op medications were sent to her pharmacy of choice.  Preoperative clearance will be obtained from her PCP.  A copy of this note was sent to the patient's referring provider.   70 minutes of total time was spent for this patient encounter, including preparation, face-to-face counseling with the patient and coordination of care, and documentation of the encounter.  Brandy Dollar, Dean  Division of  Gynecologic Oncology  Department of Obstetrics and Gynecology  [facility]  ___________________________________________  Chief Complaint: Chief Complaint  Patient presents with   endometrial cancer    History of Present Illness:  [name] is a 73 y.o. y.o. female who is seen in consultation at the request of Dr. Ozan for an evaluation of endometrial cancer.  Patient presented with postmenopausal bleeding.  She describes this as light pink spotting for several months, mostly sees just when she wipes.  Denied any pelvic or abdominal pain. Vaginal biopsy was performed in clinic on 8/6.  Pathology from this showed scant fibrinous/necrotic material, and adequate for diagnosis. Pelvic ultrasound on 8/24 revealed a uterus measuring 10.3 x 4.6 x 5.4 cm.  Endometrium measures 5 cm.  Right ovary measured up to 2.3 cm.  Left ovary not visualized. 02/27/24: Hysteroscopy, D&C, MyoSure resection.  Findings include a 9 cm uterus with thickened endometrium, possible intracavitary fibroid.  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  Patient presents with her daughter today.  She notes in June beginning to have daily spotting, which she describes as seeing pink when she wiped after voiding.  After her recent biopsy procedure, she had increased bleeding for a week.  Bleeding is less now but more than it had been initially.  She denies any associated pelvic pain or cramping.  She endorses normal bowel and bladder function.  Reports a good appetite without nausea or emesis.  Denies any significant weight changes.  Has a history of a heart murmur since she was a child.  Has never seen a cardiologist.  Denies any chest pain.  Has intermittent shortness of breath with activity.  PAST MEDICAL HISTORY:  Past Medical History:  Diagnosis Date   Bacterial pneumonia    March  9-12,  2011   Bronchial asthma     Essential hypertension    Heart murmur    Hyperlipidemia    Morbid obesity (HCC)      PAST SURGICAL HISTORY:  Past Surgical History:  Procedure Laterality Date   CESAREAN SECTION  1988 & 1991   HYSTEROSCOPY WITH D & C N/A 02/27/2024   Procedure: DILATATION AND CURETTAGE /HYSTEROSCOPY;  Surgeon: Dean, Nolene, DO;  Location: [facility];  Service: Gynecology;  Laterality: N/A;   RIght and left keloid ears  06/13/1966    OB/GYN HISTORY:  OB History  Gravida Para Term Preterm AB Living  3 3 3   3   SAB IAB Ectopic Multiple Live Births          # Outcome Date GA Lbr Len/2nd Weight Sex Type Anes PTL Lv  3 Term 1991     CS-Unspec     2 Term 35     CS-LTranv     1 Term 3     Vag-Spont       No LMP recorded. Patient is postmenopausal.  Age at menarche[age] 38-13  Age at menopause: 68 Hx of HRT: denies Hx of STDs: denies Last pap: at age 60 History of abnormal pap smears: denies  SCREENING STUDIES:  Last mammogram: 2025  Last colonoscopy: 2024 per her report  MEDICATIONS: Outpatient Encounter Medications as of 03/15/2024  Medication Sig   acetaminophen  (TYLENOL ) 500 MG tablet Take 500-1,000 mg by mouth every 6 (six) hours as needed for moderate pain (pain score 4-6).   Albuterol -Budesonide  (AIRSUPRA ) 90-80 MCG/ACT AERO Inhale 2 puffs into the lungs as needed. Inhale 2 puffs as needed for wheezing, maximum 12 puffs/24 hours   BREZTRI  AEROSPHERE 160-9-4.8 MCG/ACT AERO inhaler INHALE  2 PUFFS INTO THE LUNGS TWICE DAILY   carvedilol  (COREG ) 3.125 MG tablet Take 1 tablet (3.125 mg total) by mouth 2 (two) times daily with a meal.   Cholecalciferol (VITAMIN D3 PO) Take 1 tablet by mouth daily.   clotrimazole -betamethasone  (LOTRISONE ) cream APPLY TOPICALLY TO THE AFFECTED AREA TWICE DAILY   polyethylene glycol (MIRALAX) 17 g packet Take 17 g by mouth daily. For AFTER surgery, do not take if having diarrhea   traMADol  (ULTRAM ) 50 MG tablet Take 1 tablet (50 mg total) by mouth every 6  (six) hours as needed for moderate pain (pain score 4-6). For AFTER surgery only, do not take and drive   triamterene -hydrochlorothiazide (MAXZIDE) 75-50 MG tablet TAKE 1 TABLET BY MOUTH DAILY   meloxicam  (MOBIC ) 15 MG tablet Take one tablet once daily for 1 week , then as needed, for shoulder pain (Patient not taking: Reported on 03/13/2024)   No facility-administered encounter medications on file as of 03/15/2024.    ALLERGIES:  Allergies  Allergen Reactions   Benzonatate Shortness Of Breath    TRIGGERS ASTHMA ATTACK     FAMILY HISTORY:  Family History  Problem Relation Age of Onset   Stroke Mother    Hypertension Mother    Esophageal cancer Father    Obesity Sister    Lung cancer Brother    Breast cancer Neg Hx    Prostate cancer Neg Hx    Endometrial cancer Neg Hx    Ovarian cancer Neg Hx    Colon cancer Neg Hx    Pancreatic cancer Neg Hx      SOCIAL HISTORY:  Social Connections: Moderately Integrated (08/25/2020)   Social Connection and Isolation Panel    Frequency of Communication with Friends and Family: More than three times a week    Frequency of Social Gatherings with Friends and Family: More than three times a week    Attends Religious Services: More than 4 times per year    Active Member of [employer] or Organizations: Yes    Attends Engineer, structural: More than 4 times per year    Marital Status: Separated    REVIEW OF SYSTEMS:  + vaginal bleeding Denies appetite changes, fevers, chills, fatigue, unexplained weight changes. Denies hearing loss, neck lumps or masses, mouth sores, ringing in ears or voice changes. Denies cough or wheezing.  Denies shortness of breath. Denies chest pain or palpitations. Denies leg swelling. Denies abdominal distention, pain, blood in stools, constipation, diarrhea, nausea, vomiting, or early satiety. Denies pain with intercourse, dysuria, frequency, hematuria or incontinence. Denies hot flashes, pelvic pain or vaginal  discharge.   Denies joint pain, back pain or muscle pain/cramps. Denies itching, rash, or wounds. Denies dizziness, headaches, numbness or seizures. Denies swollen lymph nodes or glands, denies easy bruising or bleeding. Denies anxiety, depression, confusion, or decreased concentration.  Physical Exam:  Vital Signs for this encounter:  Blood pressure (!) 142/62, pulse (!) 55, temperature 98 F (36.7 C), temperature source Oral, resp. rate 20, height 5' 4 (1.626 m), weight 241 lb 12.8 oz (109.7 kg), SpO2 94%. Body mass index is 41.5 kg/m. General: Alert, oriented, no acute distress.  HEENT: Normocephalic, atraumatic. Sclera anicteric.  Chest: Clear to auscultation bilaterally. No wheezes, rhonchi, or rales. Cardiovascular: Regular rate and rhythm, systolic murmur, no rubs or gallops.  Abdomen: Obese. Normoactive bowel sounds. Soft, nondistended, nontender to palpation. No masses or hepatosplenomegaly appreciated. No palpable fluid wave.  Extremities: Grossly normal range of motion. Warm, well perfused. No  edema bilaterally.  Skin: No rashes or lesions.  Lymphatics: No cervical, supraclavicular, or inguinal adenopathy.  GU:  Normal external female genitalia. No lesions. No discharge or bleeding.             Bladder/urethra:  No lesions or masses, well supported bladder             Vagina: Well-rugated, no lesions.  Minimal blood within the vaginal vault.             Cervix: Normal appearing, no lesions.  Somewhat difficult to visualize given her position on the bed and discomfort with exam.             Uterus: Mildly enlarged, moderately mobile, no parametrial involvement or nodularity.             Adnexa: No masses appreciated.  Rectal: Deferred.  LABORATORY AND RADIOLOGIC DATA:  Outside medical records were reviewed to synthesize the above history, along with the history and physical obtained during the visit.   Lab Results  Component Value Date   WBC 6.8 02/16/2024   HGB 13.6  02/16/2024   HCT 43.5 02/16/2024   PLT 218 02/16/2024   GLUCOSE 85 02/16/2024   CHOL 247 (H) 02/16/2024   TRIG 57 02/16/2024   HDL 77 02/16/2024   LDLCALC 161 (H) 02/16/2024   ALT 7 02/16/2024   AST 13 02/16/2024   NA 137 02/16/2024   K 4.0 02/16/2024   CL 98 02/16/2024   CREATININE 0.77 02/16/2024   BUN 18 02/16/2024   CO2 26 02/16/2024   TSH 1.060 02/16/2024   HGBA1C 5.6 02/16/2024

## 2024-03-13 NOTE — Progress Notes (Unsigned)
 GYNECOLOGIC ONCOLOGY NEW PATIENT CONSULTATION   Patient Name: Brandy Dean  Patient Age: 73 y.o. Date of Service: 03/15/24 Referring Provider: [name], MD  Primary Care Provider: [name], MD Consulting Provider: Comer Dollar, MD   Assessment/Plan:  Postmenopausal patient with high-grade endometrial cancer.  We reviewed the nature of endometrial cancer and its recommended surgical staging, including total hysterectomy, bilateral salpingo-oophorectomy, and lymph node assessment. The patient is a suitable candidate for staging via a minimally invasive approach to surgery.  We reviewed that robotic assistance would be used to complete the surgery.   We discussed that most endometrial cancer is detected early, however, we adjuvant therapy will likely be recommended based on the patient's biopsy. We will defer these decisions until we have final pathology results.    Given her high risk histology, I recommend CT scan preoperatively to rule out metastatic disease.  We reviewed the sentinel lymph node technique. Risks and benefits of sentinel lymph node biopsy was reviewed. We reviewed the technique and ICG dye. The patient DOES NOT have an iodine  allergy or known liver dysfunction. We reviewed the false negative rate (0.4%), and that 3% of patients with metastatic disease will not have it detected by SLN biopsy in endometrial cancer. A low risk of allergic reaction to the dye, <0.2% for ICG, has been reported. We also discussed that in the case of failed mapping, which occurs 40% of the time, a bilateral or unilateral lymphadenectomy will be performed at the surgeon's discretion.   Potential benefits of sentinel nodes including a higher detection rate for metastasis due to ultrastaging and potential reduction in operative morbidity. However, there remains uncertainty as to the role for treatment of micrometastatic disease. Further, the benefit of operative morbidity associated  with the SLN technique in endometrial cancer is not yet completely known. In other patient populations (e.g. the cervical cancer population) there has been observed reductions in morbidity with SLN biopsy compared to pelvic lymphadenectomy. Lymphedema, nerve dysfunction and lymphocysts are all potential risks with the SLN technique as with complete lymphadenectomy. Additional risks to the patient include the risk of damage to an internal organ while operating in an altered view (e.g. the black and white image of the robotic fluorescence imaging mode).   We discussed the plan for a robotic assisted hysterectomy, bilateral salpingo-oophorectomy, sentinel lymph node evaluation, possible lymph node dissection, possible omental biopsy, possible laparotomy. The risks of surgery were discussed in detail and she understands these to include infection; wound separation; hernia; vaginal cuff separation, injury to adjacent organs such as bowel, bladder, blood vessels, ureters and nerves; bleeding which may require blood transfusion; anesthesia risk; thromboembolic events; possible death; unforeseen complications; possible need for re-exploration; medical complications such as heart attack, stroke, pleural effusion and pneumonia; and, if full lymphadenectomy is performed the risk of lymphedema and lymphocyst. The patient will receive DVT and antibiotic prophylaxis as indicated. She voiced a clear understanding. She had the opportunity to ask questions. Perioperative instructions were reviewed with her. Prescriptions for post-op medications were sent to her pharmacy of choice.  Preoperative clearance will be obtained from her PCP.  A copy of this note was sent to the patient's referring provider.   70 minutes of total time was spent for this patient encounter, including preparation, face-to-face counseling with the patient and coordination of care, and documentation of the encounter.  Comer Dollar, MD  Division of  Gynecologic Oncology  Department of Obstetrics and Gynecology  [facility]  ___________________________________________  Chief Complaint: Chief Complaint  Patient presents with   endometrial cancer    History of Present Illness:  Brandy Dean is a 73 y.o. y.o. female who is seen in consultation at the request of Dr. Ozan for an evaluation of endometrial cancer.  Patient presented with postmenopausal bleeding.  She describes this as light pink spotting for several months, mostly sees just when she wipes.  Denied any pelvic or abdominal pain. Vaginal biopsy was performed in clinic on 8/6.  Pathology from this showed scant fibrinous/necrotic material, and adequate for diagnosis. Pelvic ultrasound on 8/24 revealed a uterus measuring 10.3 x 4.6 x 5.4 cm.  Endometrium measures 5 cm.  Right ovary measured up to 2.3 cm.  Left ovary not visualized. 02/27/24: Hysteroscopy, D&C, MyoSure resection.  Findings include a 9 cm uterus with thickened endometrium, possible intracavitary fibroid.  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  Patient presents with her daughter today.  She notes in June beginning to have daily spotting, which she describes as seeing pink when she wiped after voiding.  After her recent biopsy procedure, she had increased bleeding for a week.  Bleeding is less now but more than it had been initially.  She denies any associated pelvic pain or cramping.  She endorses normal bowel and bladder function.  Reports a good appetite without nausea or emesis.  Denies any significant weight changes.  Has a history of a heart murmur since she was a child.  Has never seen a cardiologist.  Denies any chest pain.  Has intermittent shortness of breath with activity.  PAST MEDICAL HISTORY:  Past Medical History:  Diagnosis Date   Bacterial pneumonia    March  9-12,  2011   Bronchial asthma     Essential hypertension    Heart murmur    Hyperlipidemia    Morbid obesity (HCC)      PAST SURGICAL HISTORY:  Past Surgical History:  Procedure Laterality Date   CESAREAN SECTION  1988 & 1991   HYSTEROSCOPY WITH D & C N/A 02/27/2024   Procedure: DILATATION AND CURETTAGE /HYSTEROSCOPY;  Surgeon: Ozan, Kirk, DO;  Location: [facility];  Service: Gynecology;  Laterality: N/A;   RIght and left keloid ears  06/13/1966    OB/GYN HISTORY:  OB History  Gravida Para Term Preterm AB Living  3 3 3   3   SAB IAB Ectopic Multiple Live Births          # Outcome Date GA Lbr Len/2nd Weight Sex Type Anes PTL Lv  3 Term 1991     CS-Unspec     2 Term 22     CS-LTranv     1 Term 67     Vag-Spont       No LMP recorded. Patient is postmenopausal.  Age at menarche[age]  Age at menopause: 47 Hx of HRT: denies Hx of STDs: denies Last pap: at age 35 History of abnormal pap smears: denies  SCREENING STUDIES:  Last mammogram: 2025  Last colonoscopy: 2024 per her report  MEDICATIONS: Outpatient Encounter Medications as of 03/15/2024  Medication Sig   acetaminophen  (TYLENOL ) 500 MG tablet Take 500-1,000 mg by mouth every 6 (six) hours as needed for moderate pain (pain score 4-6).   Albuterol -Budesonide  (AIRSUPRA ) 90-80 MCG/ACT AERO Inhale 2 puffs into the lungs as needed. Inhale 2 puffs as needed for wheezing, maximum 12 puffs/24 hours   BREZTRI  AEROSPHERE 160-9-4.8 MCG/ACT AERO inhaler INHALE  2 PUFFS INTO THE LUNGS TWICE DAILY   carvedilol  (COREG ) 3.125 MG tablet Take 1 tablet (3.125 mg total) by mouth 2 (two) times daily with a meal.   Cholecalciferol (VITAMIN D3 PO) Take 1 tablet by mouth daily.   clotrimazole -betamethasone  (LOTRISONE ) cream APPLY TOPICALLY TO THE AFFECTED AREA TWICE DAILY   polyethylene glycol (MIRALAX) 17 g packet Take 17 g by mouth daily. For AFTER surgery, do not take if having diarrhea   traMADol  (ULTRAM ) 50 MG tablet Take 1 tablet (50 mg total) by mouth every 6  (six) hours as needed for moderate pain (pain score 4-6). For AFTER surgery only, do not take and drive   triamterene -hydrochlorothiazide (MAXZIDE) 75-50 MG tablet TAKE 1 TABLET BY MOUTH DAILY   meloxicam  (MOBIC ) 15 MG tablet Take one tablet once daily for 1 week , then as needed, for shoulder pain (Patient not taking: Reported on 03/13/2024)   No facility-administered encounter medications on file as of 03/15/2024.    ALLERGIES:  Allergies  Allergen Reactions   Benzonatate Shortness Of Breath    TRIGGERS ASTHMA ATTACK     FAMILY HISTORY:  Family History  Problem Relation Age of Onset   Stroke Mother    Hypertension Mother    Esophageal cancer Father    Obesity Sister    Lung cancer Brother    Breast cancer Neg Hx    Prostate cancer Neg Hx    Endometrial cancer Neg Hx    Ovarian cancer Neg Hx    Colon cancer Neg Hx    Pancreatic cancer Neg Hx      SOCIAL HISTORY:  Social Connections: Moderately Integrated (08/25/2020)   Social Connection and Isolation Panel    Frequency of Communication with Friends and Family: More than three times a week    Frequency of Social Gatherings with Friends and Family: More than three times a week    Attends Religious Services: More than 4 times per year    Active Member of [employer] or Organizations: Yes    Attends Engineer, structural: More than 4 times per year    Marital Status: Separated    REVIEW OF SYSTEMS:  + vaginal bleeding Denies appetite changes, fevers, chills, fatigue, unexplained weight changes. Denies hearing loss, neck lumps or masses, mouth sores, ringing in ears or voice changes. Denies cough or wheezing.  Denies shortness of breath. Denies chest pain or palpitations. Denies leg swelling. Denies abdominal distention, pain, blood in stools, constipation, diarrhea, nausea, vomiting, or early satiety. Denies pain with intercourse, dysuria, frequency, hematuria or incontinence. Denies hot flashes, pelvic pain or vaginal  discharge.   Denies joint pain, back pain or muscle pain/cramps. Denies itching, rash, or wounds. Denies dizziness, headaches, numbness or seizures. Denies swollen lymph nodes or glands, denies easy bruising or bleeding. Denies anxiety, depression, confusion, or decreased concentration.  Physical Exam:  Vital Signs for this encounter:  Blood pressure (!) 142/62, pulse (!) 55, temperature 98 F (36.7 C), temperature source Oral, resp. rate 20, height 5' 4 (1.626 m), weight 241 lb 12.8 oz (109.7 kg), SpO2 94%. Body mass index is 41.5 kg/m. General: Alert, oriented, no acute distress.  HEENT: Normocephalic, atraumatic. Sclera anicteric.  Chest: Clear to auscultation bilaterally. No wheezes, rhonchi, or rales. Cardiovascular: Regular rate and rhythm, systolic murmur, no rubs or gallops.  Abdomen: Obese. Normoactive bowel sounds. Soft, nondistended, nontender to palpation. No masses or hepatosplenomegaly appreciated. No palpable fluid wave.  Extremities: Grossly normal range of motion. Warm, well perfused. No  edema bilaterally.  Skin: No rashes or lesions.  Lymphatics: No cervical, supraclavicular, or inguinal adenopathy.  GU:  Normal external female genitalia. No lesions. No discharge or bleeding.             Bladder/urethra:  No lesions or masses, well supported bladder             Vagina: Well-rugated, no lesions.  Minimal blood within the vaginal vault.             Cervix: Normal appearing, no lesions.  Somewhat difficult to visualize given her position on the bed and discomfort with exam.             Uterus: Mildly enlarged, moderately mobile, no parametrial involvement or nodularity.             Adnexa: No masses appreciated.  Rectal: Deferred.  LABORATORY AND RADIOLOGIC DATA:  Outside medical records were reviewed to synthesize the above history, along with the history and physical obtained during the visit.   Lab Results  Component Value Date   WBC 6.8 02/16/2024   HGB 13.6  02/16/2024   HCT 43.5 02/16/2024   PLT 218 02/16/2024   GLUCOSE 85 02/16/2024   CHOL 247 (H) 02/16/2024   TRIG 57 02/16/2024   HDL 77 02/16/2024   LDLCALC 161 (H) 02/16/2024   ALT 7 02/16/2024   AST 13 02/16/2024   NA 137 02/16/2024   K 4.0 02/16/2024   CL 98 02/16/2024   CREATININE 0.77 02/16/2024   BUN 18 02/16/2024   CO2 26 02/16/2024   TSH 1.060 02/16/2024   HGBA1C 5.6 02/16/2024

## 2024-03-14 ENCOUNTER — Encounter: Admitting: Obstetrics & Gynecology

## 2024-03-15 ENCOUNTER — Encounter: Payer: Self-pay | Admitting: Gynecologic Oncology

## 2024-03-15 ENCOUNTER — Telehealth: Payer: Self-pay | Admitting: *Deleted

## 2024-03-15 ENCOUNTER — Inpatient Hospital Stay: Attending: Gynecologic Oncology | Admitting: Gynecologic Oncology

## 2024-03-15 ENCOUNTER — Inpatient Hospital Stay

## 2024-03-15 ENCOUNTER — Inpatient Hospital Stay (HOSPITAL_BASED_OUTPATIENT_CLINIC_OR_DEPARTMENT_OTHER): Admitting: Gynecologic Oncology

## 2024-03-15 VITALS — BP 142/62 | HR 55 | Temp 98.0°F | Resp 20 | Ht 64.0 in | Wt 241.8 lb

## 2024-03-15 DIAGNOSIS — C541 Malignant neoplasm of endometrium: Secondary | ICD-10-CM | POA: Insufficient documentation

## 2024-03-15 DIAGNOSIS — Z801 Family history of malignant neoplasm of trachea, bronchus and lung: Secondary | ICD-10-CM | POA: Diagnosis not present

## 2024-03-15 DIAGNOSIS — J45909 Unspecified asthma, uncomplicated: Secondary | ICD-10-CM | POA: Insufficient documentation

## 2024-03-15 DIAGNOSIS — Z791 Long term (current) use of non-steroidal anti-inflammatories (NSAID): Secondary | ICD-10-CM | POA: Diagnosis not present

## 2024-03-15 DIAGNOSIS — E785 Hyperlipidemia, unspecified: Secondary | ICD-10-CM | POA: Insufficient documentation

## 2024-03-15 DIAGNOSIS — R011 Cardiac murmur, unspecified: Secondary | ICD-10-CM | POA: Insufficient documentation

## 2024-03-15 DIAGNOSIS — I10 Essential (primary) hypertension: Secondary | ICD-10-CM | POA: Diagnosis not present

## 2024-03-15 DIAGNOSIS — Z79899 Other long term (current) drug therapy: Secondary | ICD-10-CM | POA: Insufficient documentation

## 2024-03-15 DIAGNOSIS — Z800 Family history of malignant neoplasm of digestive organs: Secondary | ICD-10-CM | POA: Diagnosis not present

## 2024-03-15 DIAGNOSIS — Z6841 Body Mass Index (BMI) 40.0 and over, adult: Secondary | ICD-10-CM | POA: Diagnosis not present

## 2024-03-15 DIAGNOSIS — N950 Postmenopausal bleeding: Secondary | ICD-10-CM | POA: Diagnosis not present

## 2024-03-15 MED ORDER — POLYETHYLENE GLYCOL 3350 17 G PO PACK: 17.0000 g | PACK | Freq: Every day | ORAL | 0 refills | Status: AC

## 2024-03-15 MED ORDER — TRAMADOL HCL 50 MG PO TABS: 50.0000 mg | ORAL_TABLET | Freq: Four times a day (QID) | ORAL | 0 refills | Status: DC | PRN

## 2024-03-15 NOTE — Patient Instructions (Addendum)
 Plan to have a CT scan prior to surgery. Nothing to eat or drink 4 hours before your scan and arrive 2 hours before your scan to begin drinking oral contrast.   Preparing for your Surgery  Plan for surgery on March 27, 2024 with Dr. Comer Dollar at [facility]. You will be scheduled for robotic assisted total laparoscopic hysterectomy (removal of the uterus and cervix), bilateral salpingo-oophorectomy (removal of both ovaries and fallopian tubes), sentinel lymph node biopsy, possible lymph node dissection, possible laparotomy (larger incision on your abdomen if needed), possible omentectomy.   DO NOT TAKE MOBIC  (MELOXICAM ) AT LEAST 10 DAYS BEFORE SURGERY.  Pre-operative Testing -You will receive a phone call from presurgical testing at [facility] to arrange for a pre-operative appointment and lab work.  -Bring your insurance card, copy of an advanced directive if applicable, medication list  -At that visit, you will be asked to sign a consent for a possible blood transfusion in case a transfusion becomes necessary during surgery.  The need for a blood transfusion is rare but having consent is a necessary part of your care.     -You should not be taking blood thinners or aspirin at least ten days prior to surgery unless instructed by your surgeon.  -Do not take supplements such as fish oil (omega 3), red yeast rice, turmeric before your surgery. STOP TAKING AT LEAST 10 DAYS BEFORE SURGERY. You want to avoid medications with aspirin in them including headache powders such as BC or Goody's), Excedrin migraine.  -If you are taking a GLP-1 medication/injection such as Ozempic , Mounjaro, Wegovy , this needs to be held before surgery for at least 7 days before.  Day Before Surgery at Home -You will be asked to take in a light diet the day before surgery. You will be advised you can have clear liquids up until 3 hours before your surgery.    Eat a light diet the day before  surgery.  Examples including soups, broths, toast, yogurt, mashed potatoes.  AVOID GAS PRODUCING FOODS AND BEVERAGES. Things to avoid include carbonated beverages (fizzy beverages, sodas), raw fruits and raw vegetables (uncooked), or beans.   If your bowels are filled with gas, your surgeon will have difficulty visualizing your pelvic organs which increases your surgical risks.  Your role in recovery Your role is to become active as soon as directed by your doctor, while still giving yourself time to heal.  Rest when you feel tired. You will be asked to do the following in order to speed your recovery:  - Cough and breathe deeply. This helps to clear and expand your lungs and can prevent pneumonia after surgery.  - STAY ACTIVE WHEN YOU GET HOME. Do mild physical activity. Walking or moving your legs help your circulation and body functions return to normal. Do not try to get up or walk alone the first time after surgery.   -If you develop swelling on one leg or the other, pain in the back of your leg, redness/warmth in one of your legs, please call the office or go to the Emergency Room to have a doppler to rule out a blood clot. For shortness of breath, chest pain-seek care in the Emergency Room as soon as possible. - Actively manage your pain. Managing your pain lets you move in comfort. We will ask you to rate your pain on a scale of zero to 10. It is your responsibility to tell your doctor or nurse where and how much  you hurt so your pain can be treated.  Special Considerations -If you are diabetic, you may be placed on insulin after surgery to have closer control over your blood sugars to promote healing and recovery.  This does not mean that you will be discharged on insulin.  If applicable, your oral antidiabetics will be resumed when you are tolerating a solid diet.  -Your final pathology results from surgery should be available around one week after surgery and the results will be relayed to  you when available.  -FMLA forms can be faxed to [phone] and please allow 5-7 business days for completion.  Pain Management After Surgery -You will be prescribed your pain medication and bowel regimen medications before surgery so that you can have these available when you are discharged from the hospital. The pain medication is for use ONLY AFTER surgery and a new prescription will not be given.   -Make sure that you have Tylenol  and Ibuprofen IF YOU ARE ABLE TO TAKE THESE MEDICATIONS at home to use on a regular basis after surgery for pain control. We recommend alternating the medications every hour to six hours since they work differently and are processed in the body differently for pain relief.  -Review the attached handout on narcotic use and their risks and side effects.   Bowel Regimen -You will be prescribed miralax to take nightly to prevent constipation especially if you are taking the narcotic pain medication intermittently.  It is important to prevent constipation and drink adequate amounts of liquids. You can stop taking this medication when you are not taking pain medication and you are back on your normal bowel routine.  Risks of Surgery Risks of surgery are low but include bleeding, infection, damage to surrounding structures, re-operation, blood clots, and very rarely death.   Blood Transfusion Information (For the consent to be signed before surgery)  We will be checking your blood type before surgery so in case of emergencies, we will know what type of blood you would need.                                            WHAT IS A BLOOD TRANSFUSION?  A transfusion is the replacement of blood or some of its parts. Blood is made up of multiple cells which provide different functions. Red blood cells carry oxygen  and are used for blood loss replacement. White blood cells fight against infection. Platelets control bleeding. Plasma helps clot blood. Other blood products  are available for specialized needs, such as hemophilia or other clotting disorders. BEFORE THE TRANSFUSION  Who gives blood for transfusions?  You may be able to donate blood to be used at a later date on yourself (autologous donation). Relatives can be asked to donate blood. This is generally not any safer than if you have received blood from a stranger. The same precautions are taken to ensure safety when a relative's blood is donated. Healthy volunteers who are fully evaluated to make sure their blood is safe. This is blood bank blood. Transfusion therapy is the safest it has ever been in the practice of medicine. Before blood is taken from a donor, a complete history is taken to make sure that person has no history of diseases nor engages in risky social behavior (examples are intravenous drug use or sexual activity with multiple partners). The donor's travel history is screened  to minimize risk of transmitting infections, such as malaria. The donated blood is tested for signs of infectious diseases, such as HIV and hepatitis. The blood is then tested to be sure it is compatible with you in order to minimize the chance of a transfusion reaction. If you or a relative donates blood, this is often done in anticipation of surgery and is not appropriate for emergency situations. It takes many days to process the donated blood. RISKS AND COMPLICATIONS Although transfusion therapy is very safe and saves many lives, the main dangers of transfusion include:  Getting an infectious disease. Developing a transfusion reaction. This is an allergic reaction to something in the blood you were given. Every precaution is taken to prevent this. The decision to have a blood transfusion has been considered carefully by your caregiver before blood is given. Blood is not given unless the benefits outweigh the risks.  AFTER SURGERY INSTRUCTIONS  Return to work: 4-6 weeks if applicable  Activity: 1. Be up and out of  the bed during the day.  Take a nap if needed.  You may walk up steps but be careful and use the hand rail.  Stair climbing will tire you more than you think, you may need to stop part way and rest.   2. No lifting or straining for 6 weeks over 10 pounds. No pushing, pulling, straining for 6 weeks.  3. No driving for 4-89 days when the following criteria have been met: Do not drive if you are taking narcotic pain medicine and make sure that your reaction time has returned.   4. You can shower as soon as the next day after surgery. Shower daily.  Use your regular soap and water (not directly on the incision) and pat your incision(s) dry afterwards; don't rub.  No tub baths or submerging your body in water until cleared by your surgeon. If you have the soap that was given to you by pre-surgical testing that was used before surgery, you do not need to use it afterwards because this can irritate your incisions.   5. No sexual activity and nothing in the vagina for 12 weeks.  6. You may experience a small amount of clear drainage from your incisions, which is normal.  If the drainage persists, increases, or changes color please call the office.  7. Do not use creams, lotions, or ointments such as neosporin on your incisions after surgery until advised by your surgeon because they can cause removal of the dermabond glue on your incisions.    8. You may experience vaginal spotting after surgery or when the stitches at the top of the vagina begin to dissolve.  The spotting is normal but if you experience heavy bleeding, call our office.  9. Take Tylenol  or ibuprofen first for pain if you are able to take these medications and only use narcotic pain medication for severe pain not relieved by the Tylenol  or Ibuprofen.  Monitor your Tylenol  intake to a max of 4,000 mg in a 24 hour period. You can alternate these medications after surgery.  Diet: 1. Low sodium Heart Healthy Diet is recommended but you are  cleared to resume your normal (before surgery) diet after your procedure.  2. It is safe to use a laxative, such as Miralax or Colace, if you have difficulty moving your bowels before surgery. You have been prescribed Miralax to take at bedtime every evening after surgery to keep bowel movements regular and to prevent constipation.  Wound Care: 1. Keep clean and dry.  Shower daily.  Reasons to call the Doctor: Fever - Oral temperature greater than 100.4 degrees Fahrenheit Foul-smelling vaginal discharge Difficulty urinating Nausea and vomiting Increased pain at the site of the incision that is unrelieved with pain medicine. Difficulty breathing with or without chest pain New calf pain especially if only on one side Sudden, continuing increased vaginal bleeding with or without clots.   Contacts: For questions or concerns you should contact:  Dr. Comer Dollar at [phone]  Eleanor Epps, NP at [phone]  After Hours: call [phone] and have the GYN Oncologist paged/contacted (after 5 pm or on the weekends). You will speak with an after hours RN and let he or she know you have had surgery.  Messages sent via mychart are for non-urgent matters and are not responded to after hours so for urgent needs, please call the after hours number.

## 2024-03-15 NOTE — Telephone Encounter (Signed)
 Per Dr Viktoria fax surgical optimization form to the patient's PCP office (Dr [name] [phone])

## 2024-03-16 LAB — CA 125: Cancer Antigen (CA) 125: 12.1 U/mL (ref 0.0–38.1)

## 2024-03-20 NOTE — Progress Notes (Signed)
 Sent message, via epic in basket, requesting orders in epic from Careers adviser.

## 2024-03-21 ENCOUNTER — Encounter: Payer: Self-pay | Admitting: Internal Medicine

## 2024-03-21 ENCOUNTER — Ambulatory Visit: Admitting: Internal Medicine

## 2024-03-21 VITALS — BP 134/80 | HR 68 | Ht 64.0 in | Wt 242.2 lb

## 2024-03-21 DIAGNOSIS — R011 Cardiac murmur, unspecified: Secondary | ICD-10-CM | POA: Diagnosis not present

## 2024-03-21 DIAGNOSIS — I10 Essential (primary) hypertension: Secondary | ICD-10-CM | POA: Diagnosis not present

## 2024-03-21 DIAGNOSIS — Z01818 Encounter for other preprocedural examination: Secondary | ICD-10-CM | POA: Diagnosis not present

## 2024-03-21 DIAGNOSIS — C541 Malignant neoplasm of endometrium: Secondary | ICD-10-CM

## 2024-03-21 NOTE — Assessment & Plan Note (Addendum)
 RCRI: 0 DASI: 5.62 METS  EKG reviewed from 09/25: Sinus rhythm.  No signs of active ischemia. Recent CBC and CMP reviewed  She is medically optimized with moderate risk for procedure

## 2024-03-21 NOTE — Patient Instructions (Signed)
 SURGICAL WAITING ROOM VISITATION Patients having surgery or a procedure may have no more than 2 support people in the waiting area - these visitors may rotate in the visitor waiting room.   Due to an increase in RSV and influenza rates and associated hospitalizations, children ages 12 and under may not visit patients in [facility] hospitals. If the patient needs to stay at the hospital during part of their recovery, the visitor guidelines for inpatient rooms apply.  PRE-OP VISITATION  Pre-op nurse will coordinate an appropriate time for 1 support person to accompany the patient in pre-op.  This support person may not rotate.  This visitor will be contacted when the time is appropriate for the visitor to come back in the pre-op area.  Please refer to the [facility] website for the visitor guidelines for Inpatients (after your surgery is over and you are in a regular room).  You are not required to quarantine at this time prior to your surgery. However, you must do this: Hand Hygiene often Do NOT share personal items Notify your provider if you are in close contact with someone who has COVID or you develop fever 100.4 or greater, new onset of sneezing, cough, sore throat, shortness of breath or body aches.  If you test positive for Covid or have been in contact with anyone that has tested positive in the last 10 days please notify you surgeon.    Your procedure is scheduled on:  03/27/24  Report to [facility] Main Entrance: [place] entrance where the [employer] is available.   Report to admitting at:1:45 PM  Call this number if you have any questions or problems the morning of surgery [phone]  FOLLOW ANY ADDITIONAL PRE OP INSTRUCTIONS YOU RECEIVED FROM YOUR SURGEON'S OFFICE!!!  Eat a light diet the day before surgery.  Examples including soups, broths, toast, yogurt, mashed potatoes.  Things to avoid include carbonated beverages (fizzy beverages), raw fruits and raw  vegetables, or beans.   If your bowels are filled with gas, your surgeon will have difficulty visualizing your pelvic organs which increases your surgical risks.  Do not eat food after Midnight the night prior to your surgery/procedure.  After Midnight you may have the following liquids until: 1:00 PM DAY OF SURGERY  Clear Liquid Diet Water Black Coffee (sugar ok, NO MILK/CREAM OR CREAMERS)  Tea (sugar ok, NO MILK/CREAM OR CREAMERS) regular and decaf                             Plain Jell-O  with no fruit (NO RED)                                           Fruit ices (not with fruit pulp, NO RED)                                     Popsicles (NO RED)  Juice: NO CITRUS JUICES: only apple, WHITE grape, WHITE cranberry Sports drinks like Gatorade or Powerade (NO RED)   Oral Hygiene is also important to reduce your risk of infection.        Remember - BRUSH YOUR TEETH THE MORNING OF SURGERY WITH YOUR REGULAR TOOTHPASTE  Do NOT smoke after Midnight the night before surgery.  STOP TAKING all Vitamins, Herbs and supplements 1 week before your surgery.   Take ONLY these medicines the morning of surgery with A SIP OF WATER: carvedilol .Use inhalers as usual and bring them.Tylenol  as needed.  If You have been diagnosed with Sleep Apnea - Bring CPAP mask and tubing day of surgery. We will provide you with a CPAP machine on the day of your surgery.                   You may not have any metal on your body including hair pins, jewelry, and body piercing  Do not wear make-up, lotions, powders, perfumes / cologne, or deodorant  Do not wear nail polish including gel and S&S, artificial / acrylic nails, or any other type of covering on natural nails including finger and toenails. If you have artificial nails, gel coating, etc., that needs to be removed by a nail salon, Please have this removed prior to surgery. Not doing so may mean that  your surgery could be cancelled or delayed if the Surgeon or anesthesia staff feels like they are unable to monitor you safely.   Do not shave 48 hours prior to surgery to avoid nicks in your skin which may contribute to postoperative infections.   Contacts, Hearing Aids, dentures or bridgework may not be worn into surgery. DENTURES WILL BE REMOVED PRIOR TO SURGERY PLEASE DO NOT APPLY Poly grip OR ADHESIVES!!!  You may bring a small overnight bag with you on the day of surgery, only pack items that are not valuable. [facility] IS NOT RESPONSIBLE   FOR VALUABLES THAT ARE LOST OR STOLEN.   Patients discharged on the day of surgery will not be allowed to drive home.  Someone NEEDS to stay with you for the first 24 hours after anesthesia.  Do not bring your home medications to the hospital. The Pharmacy will dispense medications listed on your medication list to you during your admission in the Hospital.  Special Instructions: Bring a copy of your healthcare power of attorney and living will documents the day of surgery, if you wish to have them scanned into your [facility] Records- EPIC  Please read over the following fact sheets you were given: IF YOU HAVE QUESTIONS ABOUT YOUR PRE-OP INSTRUCTIONS, PLEASE CALL [phone]   [facility] - Preparing for Surgery      Before surgery, you can play an important role.  Because skin is not sterile, your skin needs to be as free of germs as possible.  You can reduce the number of germs on your skin by washing with CHG (chlorahexidine gluconate) soap before surgery.  CHG is an antiseptic cleaner which kills germs and bonds with the skin to continue killing germs even after washing. Please DO NOT use if you have an allergy to CHG or antibacterial soaps.  If your skin becomes reddened/irritated stop using the CHG and inform your nurse when you arrive at Short Stay. Do not shave (including legs and underarms) for at least 48 hours prior to the first  CHG shower.  You may shave your face/neck.  Please follow these instructions carefully:  1.  Shower with CHG Soap the night before surgery ONLY (DO NOT USE THE SOAP THE MORNING OF SURGERY).  2.  If you choose to wash your hair, wash your hair first as usual with your normal  shampoo.  3.  After you shampoo, rinse your hair and body thoroughly to remove the shampoo.                             4.  Use CHG as you would any other liquid soap.  You can apply chg directly to the skin and wash.  Gently with a scrungie or clean washcloth.  5.  Apply the CHG Soap to your body ONLY FROM THE NECK DOWN.   Do not use on face/ open                           Wound or open sores. Avoid contact with eyes, ears mouth and genitals (private parts).                       Wash face,  Genitals (private parts) with your normal soap.             6.  Wash thoroughly, paying special attention to the area where your  surgery  will be performed.  7.  Thoroughly rinse your body with warm water from the neck down.  8.  DO NOT shower/wash with your normal soap after using and rinsing off the CHG Soap.                9.  Pat yourself dry with a clean towel.            10.  Wear clean pajamas.            11.  Place clean sheets on your bed the night of your first shower and do not  sleep with pets.  Day of Surgery : Do not apply any CHG, lotions/deodorants the morning of surgery.  Please wear clean clothes to the hospital/surgery center.   FAILURE TO FOLLOW THESE INSTRUCTIONS MAY RESULT IN THE CANCELLATION OF YOUR SURGERY  PATIENT SIGNATURE_________________________________  NURSE SIGNATURE__________________________________  ________________________________________________________________________ WHAT IS A BLOOD TRANSFUSION? Blood Transfusion Information  A transfusion is the replacement of blood or some of its parts. Blood is made up of multiple cells which provide different functions. Red blood cells carry oxygen  and  are used for blood loss replacement. White blood cells fight against infection. Platelets control bleeding. Plasma helps clot blood. Other blood products are available for specialized needs, such as hemophilia or other clotting disorders. BEFORE THE TRANSFUSION  Who gives blood for transfusions?  Healthy volunteers who are fully evaluated to make sure their blood is safe. This is blood bank blood. Transfusion therapy is the safest it has ever been in the practice of medicine. Before blood is taken from a donor, a complete history is taken to make sure that person has no history of diseases nor engages in risky social behavior (examples are intravenous drug use or sexual activity with multiple partners). The donor's travel history is screened to minimize risk of transmitting infections, such as malaria. The donated blood is tested for signs of infectious diseases, such as HIV and hepatitis. The blood is then tested to be sure it is compatible with you in order to minimize the chance of a transfusion reaction. If you  or a relative donates blood, this is often done in anticipation of surgery and is not appropriate for emergency situations. It takes many days to process the donated blood. RISKS AND COMPLICATIONS Although transfusion therapy is very safe and saves many lives, the main dangers of transfusion include:  Getting an infectious disease. Developing a transfusion reaction. This is an allergic reaction to something in the blood you were given. Every precaution is taken to prevent this. The decision to have a blood transfusion has been considered carefully by your caregiver before blood is given. Blood is not given unless the benefits outweigh the risks. AFTER THE TRANSFUSION Right after receiving a blood transfusion, you will usually feel much better and more energetic. This is especially true if your red blood cells have gotten low (anemic). The transfusion raises the level of the red blood cells  which carry oxygen , and this usually causes an energy increase. The nurse administering the transfusion will monitor you carefully for complications. HOME CARE INSTRUCTIONS  No special instructions are needed after a transfusion. You may find your energy is better. Speak with your caregiver about any limitations on activity for underlying diseases you may have. SEEK MEDICAL CARE IF:  Your condition is not improving after your transfusion. You develop redness or irritation at the intravenous (IV) site. SEEK IMMEDIATE MEDICAL CARE IF:  Any of the following symptoms occur over the next 12 hours: Shaking chills. You have a temperature by mouth above 102 F (38.9 C), not controlled by medicine. Chest, back, or muscle pain. People around you feel you are not acting correctly or are confused. Shortness of breath or difficulty breathing. Dizziness and fainting. You get a rash or develop hives. You have a decrease in urine output. Your urine turns a dark color or changes to pink, red, or brown. Any of the following symptoms occur over the next 10 days: You have a temperature by mouth above 102 F (38.9 C), not controlled by medicine. Shortness of breath. Weakness after normal activity. The white part of the eye turns yellow (jaundice). You have a decrease in the amount of urine or are urinating less often. Your urine turns a dark color or changes to pink, red, or brown. Document Released: 05/27/2000 Document Revised: 08/22/2011 Document Reviewed: 01/14/2008 [facility] Patient Information 2014 [place], [place].  _______________________________________________________________________

## 2024-03-21 NOTE — Progress Notes (Signed)
 Established Patient Office Visit  Subjective:  Patient ID: Brandy Dean, female    DOB: 1951-05-08  Age: 73 y.o. MRN: [id number]  CC:  Chief Complaint  Patient presents with   Medical Clearance    Surgical clearance has appt scheduled on 03/27/24, appt is at [facility] long.     HPI [name] is a 73 y.o. female with past medical history of HTN, asthma, endometrial cancer and HLD who presents for preop evaluation for laparoscopic hysterectomy with salpingo-oophorectomy.  She is planned to get hysterectomy for history of endometrial cancer.  BP is well-controlled. Takes medications regularly. Patient denies headache, dizziness, chest pain, dyspnea or palpitations.  She uses Breztri  for asthma.  She has Airsupra  as maintenance inhaler.  Denies any recent worsening of cough, dyspnea or wheezing.  Past Medical History:  Diagnosis Date   Bacterial pneumonia    March  9-12,  2011   Bronchial asthma    Essential hypertension    Heart murmur    Hyperlipidemia    Morbid obesity [facility])     Past Surgical History:  Procedure Laterality Date   CESAREAN SECTION  1988 & 1991   HYSTEROSCOPY WITH D & C N/A 02/27/2024   Procedure: DILATATION AND CURETTAGE /HYSTEROSCOPY;  Surgeon: Ozan, Aadhya, DO;  Location: [facility];  Service: Gynecology;  Laterality: N/A;   RIght and left keloid ears  06/13/1966    Family History  Problem Relation Age of Onset   Stroke Mother    Hypertension Mother    Esophageal cancer Father    Obesity Sister    Lung cancer Brother    Breast cancer Neg Hx    Prostate cancer Neg Hx    Endometrial cancer Neg Hx    Ovarian cancer Neg Hx    Colon cancer Neg Hx    Pancreatic cancer Neg Hx     Social History   Socioeconomic History   Marital status: Legally Separated    Spouse name: Not on file   Number of children: 3   Years of education: Not on file   Highest education level: Not on file  Occupational History    Employer: [employer]   Tobacco Use   Smoking status: Former    Current packs/day: 0.00    Types: Cigarettes    Quit date: 06/14/2007    Years since quitting: 16.7   Smokeless tobacco: Never  Vaping Use   Vaping status: Never Used  Substance and Sexual Activity   Alcohol use: Yes    Comment: wine occ   Drug use: No   Sexual activity: Not Currently    Birth control/protection: Post-menopausal  Other Topics Concern   Not on file  Social History Narrative   Not on file   Social Drivers of Health   Financial Resource Strain: Low Risk  (08/25/2020)   Overall Financial Resource Strain (CARDIA)    Difficulty of Paying Living Expenses: Not hard at all  Food Insecurity: No Food Insecurity (03/13/2024)   Hunger Vital Sign    Worried About Running Out of Food in the Last Year: Never true    Ran Out of Food in the Last Year: Never true  Transportation Needs: No Transportation Needs (03/13/2024)   PRAPARE - Administrator, Civil Service (Medical): No    Lack of Transportation (Non-Medical): No  Physical Activity: Insufficiently Active (08/25/2020)   Exercise Vital Sign    Days of Exercise per Week: 5 days    Minutes  of Exercise per Session: 20 min  Stress: No Stress Concern Present (08/25/2020)   [employer] of Occupational Health - Occupational Stress Questionnaire    Feeling of Stress : Not at all  Social Connections: Moderately Integrated (08/25/2020)   Social Connection and Isolation Panel    Frequency of Communication with Friends and Family: More than three times a week    Frequency of Social Gatherings with Friends and Family: More than three times a week    Attends Religious Services: More than 4 times per year    Active Member of [employer] or Organizations: Yes    Attends Engineer, structural: More than 4 times per year    Marital Status: Separated  Intimate Partner Violence: Not on file    Outpatient Medications Prior to Visit  Medication Sig Dispense Refill   acetaminophen   (TYLENOL ) 500 MG tablet Take 500-1,000 mg by mouth every 6 (six) hours as needed for moderate pain (pain score 4-6).     Albuterol -Budesonide  (AIRSUPRA ) 90-80 MCG/ACT AERO Inhale 2 puffs into the lungs as needed. Inhale 2 puffs as needed for wheezing, maximum 12 puffs/24 hours 10.7 g 5   BREZTRI  AEROSPHERE 160-9-4.8 MCG/ACT AERO inhaler INHALE 2 PUFFS INTO THE LUNGS TWICE DAILY 10.7 g 11   carvedilol  (COREG ) 3.125 MG tablet Take 1 tablet (3.125 mg total) by mouth 2 (two) times daily with a meal. 60 tablet 3   Cholecalciferol (VITAMIN D3 PO) Take 1 tablet by mouth daily.     clotrimazole -betamethasone  (LOTRISONE ) cream APPLY TOPICALLY TO THE AFFECTED AREA TWICE DAILY (Patient taking differently: Apply 1 Application topically 2 (two) times daily as needed (irritation).) 45 g 1   meloxicam  (MOBIC ) 15 MG tablet Take one tablet once daily for 1 week , then as needed, for shoulder pain 30 tablet 0   polyethylene glycol (MIRALAX) 17 g packet Take 17 g by mouth daily. For AFTER surgery, do not take if having diarrhea 14 each 0   traMADol  (ULTRAM ) 50 MG tablet Take 1 tablet (50 mg total) by mouth every 6 (six) hours as needed for moderate pain (pain score 4-6). For AFTER surgery only, do not take and drive 10 tablet 0   triamterene -hydrochlorothiazide (MAXZIDE) 75-50 MG tablet TAKE 1 TABLET BY MOUTH DAILY 90 tablet 1   No facility-administered medications prior to visit.    Allergies  Allergen Reactions   Benzonatate Shortness Of Breath    TRIGGERS ASTHMA ATTACK    ROS Review of Systems  Constitutional:  Negative for chills and fever.  HENT:  Negative for congestion, sinus pressure, sinus pain and sore throat.   Eyes:  Negative for pain and discharge.  Respiratory:  Negative for cough and shortness of breath.   Cardiovascular:  Negative for chest pain and palpitations.  Gastrointestinal:  Negative for abdominal pain, diarrhea, nausea and vomiting.  Endocrine: Negative for polydipsia and polyuria.   Genitourinary:  Negative for dysuria and hematuria.  Musculoskeletal:  Negative for neck pain and neck stiffness.  Skin:  Negative for rash.  Neurological:  Negative for dizziness and weakness.  Psychiatric/Behavioral:  Negative for agitation and behavioral problems.       Objective:    Physical Exam Vitals reviewed.  Constitutional:      General: She is not in acute distress.    Appearance: She is not diaphoretic.  HENT:     Head: Normocephalic and atraumatic.     Nose: Nose normal. No congestion.     Mouth/Throat:  Mouth: Mucous membranes are moist.     Pharynx: No posterior oropharyngeal erythema.  Eyes:     General: No scleral icterus.    Extraocular Movements: Extraocular movements intact.  Cardiovascular:     Rate and Rhythm: Normal rate and regular rhythm.     Heart sounds: Murmur (Systolic) heard.  Pulmonary:     Breath sounds: Normal breath sounds. No wheezing or rales.  Musculoskeletal:     Cervical back: Neck supple. No tenderness.     Right lower leg: No edema.     Left lower leg: No edema.  Skin:    General: Skin is warm.     Findings: No rash.  Neurological:     General: No focal deficit present.     Mental Status: She is alert and oriented to person, place, and time.  Psychiatric:        Mood and Affect: Mood normal.        Behavior: Behavior normal.     BP 134/80   Pulse 68   Ht 5' 4 (1.626 m)   Wt 242 lb 3.2 oz (109.9 kg)   SpO2 99%   BMI 41.57 kg/m  Wt Readings from Last 3 Encounters:  03/21/24 242 lb 3.2 oz (109.9 kg)  03/15/24 241 lb 12.8 oz (109.7 kg)  02/27/24 236 lb 1.8 oz (107.1 kg)    Lab Results  Component Value Date   TSH 1.060 02/16/2024   Lab Results  Component Value Date   WBC 6.8 02/16/2024   HGB 13.6 02/16/2024   HCT 43.5 02/16/2024   MCV 88 02/16/2024   PLT 218 02/16/2024   Lab Results  Component Value Date   NA 137 02/16/2024   K 4.0 02/16/2024   CO2 26 02/16/2024   GLUCOSE 85 02/16/2024   BUN 18  02/16/2024   CREATININE 0.77 02/16/2024   BILITOT 0.8 02/16/2024   ALKPHOS 61 02/16/2024   AST 13 02/16/2024   ALT 7 02/16/2024   PROT 6.8 02/16/2024   ALBUMIN 4.5 02/16/2024   CALCIUM  10.5 (H) 02/16/2024   ANIONGAP 8 06/23/2015   EGFR 82 02/16/2024   Lab Results  Component Value Date   CHOL 247 (H) 02/16/2024   Lab Results  Component Value Date   HDL 77 02/16/2024   Lab Results  Component Value Date   LDLCALC 161 (H) 02/16/2024   Lab Results  Component Value Date   TRIG 57 02/16/2024   Lab Results  Component Value Date   CHOLHDL 3.2 02/16/2024   Lab Results  Component Value Date   HGBA1C 5.6 02/16/2024      Assessment & Plan:   Problem List Items Addressed This Visit       Cardiovascular and Mediastinum   Essential hypertension   BP Readings from Last 1 Encounters:  03/21/24 134/80   Well-controlled with triamterene -HCTZ and carvedilol  Counseled for compliance with the medications Advised DASH diet and moderate exercise/walking as tolerated        Genitourinary   Endometrial cancer (HCC)   Followed by GYN oncology Planned to get laparoscopic hysterectomy with salpingo-oophorectomy        Other   Heart murmur   Last echocardiogram reviewed Has trivial MR and TR Likely benign, longstanding heart murmur      Preop examination - Primary   RCRI: 0 DASI: 5.62 METS  EKG reviewed from 09/25: Sinus rhythm.  No signs of active ischemia. Recent CBC and CMP reviewed  She is medically optimized with moderate  risk for procedure       No orders of the defined types were placed in this encounter.   Follow-up: No follow-ups on file.    Suzzane MARLA Blanch, MD

## 2024-03-21 NOTE — Assessment & Plan Note (Signed)
 Last echocardiogram reviewed Has trivial MR and TR Likely benign, longstanding heart murmur

## 2024-03-21 NOTE — Patient Instructions (Signed)
 Please continue to take medications as prescribed.  Please continue to follow low carb diet and perform moderate exercise/walking as tolerated.

## 2024-03-21 NOTE — Assessment & Plan Note (Addendum)
 BP Readings from Last 1 Encounters:  03/21/24 134/80   Well-controlled with triamterene -HCTZ and carvedilol  Counseled for compliance with the medications Advised DASH diet and moderate exercise/walking as tolerated

## 2024-03-21 NOTE — Assessment & Plan Note (Signed)
 Followed by GYN oncology Planned to get laparoscopic hysterectomy with salpingo-oophorectomy

## 2024-03-21 NOTE — Telephone Encounter (Signed)
 Received PCP clearance.

## 2024-03-22 ENCOUNTER — Other Ambulatory Visit: Payer: Self-pay | Admitting: Family Medicine

## 2024-03-22 ENCOUNTER — Other Ambulatory Visit: Payer: Self-pay

## 2024-03-22 ENCOUNTER — Encounter (HOSPITAL_COMMUNITY): Payer: Self-pay

## 2024-03-22 ENCOUNTER — Ambulatory Visit (HOSPITAL_COMMUNITY)
Admission: RE | Admit: 2024-03-22 | Discharge: 2024-03-22 | Disposition: A | Discharge status: Home / Self Care | Source: Ambulatory Visit | Attending: Gynecologic Oncology | Admitting: Gynecologic Oncology

## 2024-03-22 ENCOUNTER — Encounter (HOSPITAL_COMMUNITY)
Admission: RE | Admit: 2024-03-22 | Discharge: 2024-03-22 | Disposition: A | Discharge status: Home / Self Care | Source: Ambulatory Visit | Attending: Gynecologic Oncology | Admitting: Gynecologic Oncology

## 2024-03-22 DIAGNOSIS — Z87891 Personal history of nicotine dependence: Secondary | ICD-10-CM | POA: Insufficient documentation

## 2024-03-22 DIAGNOSIS — C541 Malignant neoplasm of endometrium: Secondary | ICD-10-CM | POA: Insufficient documentation

## 2024-03-22 DIAGNOSIS — Z01812 Encounter for preprocedural laboratory examination: Secondary | ICD-10-CM | POA: Insufficient documentation

## 2024-03-22 DIAGNOSIS — I10 Essential (primary) hypertension: Secondary | ICD-10-CM | POA: Insufficient documentation

## 2024-03-22 DIAGNOSIS — B369 Superficial mycosis, unspecified: Secondary | ICD-10-CM

## 2024-03-22 HISTORY — DX: Malignant (primary) neoplasm, unspecified: C80.1

## 2024-03-22 LAB — COMPREHENSIVE METABOLIC PANEL WITH GFR
ALT: 7 U/L (ref 0–44)
AST: 18 U/L (ref 15–41)
Albumin: 4.2 g/dL (ref 3.5–5.0)
Alkaline Phosphatase: 64 U/L (ref 38–126)
Anion gap: 7 (ref 5–15)
BUN: 14 mg/dL (ref 8–23)
CO2: 29 mmol/L (ref 22–32)
Calcium: 10.8 mg/dL — ABNORMAL HIGH (ref 8.9–10.3)
Chloride: 102 mmol/L (ref 98–111)
Creatinine, Ser: 0.71 mg/dL (ref 0.44–1.00)
GFR, Estimated: >60 mL/min (ref >60)
Glucose, Bld: 87 mg/dL (ref 70–99)
Potassium: 4.5 mmol/L (ref 3.5–5.1)
Sodium: 137 mmol/L (ref 135–145)
Total Bilirubin: 0.8 mg/dL (ref 0.0–1.2)
Total Protein: 7.1 g/dL (ref 6.5–8.1)

## 2024-03-22 LAB — CBC
HCT: 41.8 % (ref 36.0–46.0)
Hemoglobin: 13 g/dL (ref 12.0–15.0)
MCH: 27 pg (ref 26.0–34.0)
MCHC: 31.1 g/dL (ref 30.0–36.0)
MCV: 86.7 fL (ref 80.0–100.0)
Platelets: 214 K/uL (ref 150–400)
RBC: 4.82 MIL/uL (ref 3.87–5.11)
RDW: 14.6 % (ref 11.5–15.5)
WBC: 7 K/uL (ref 4.0–10.5)
nRBC: 0 % (ref 0.0–0.2)

## 2024-03-22 MED ORDER — IOHEXOL 300 MG/ML  SOLN
100.0000 mL | Freq: Once | INTRAMUSCULAR | Status: AC | PRN
  Administered 2024-03-22: 100 mL via INTRAVENOUS

## 2024-03-22 MED ORDER — IOHEXOL 9 MG/ML PO SOLN: 500.0000 mL | ORAL | Status: AC

## 2024-03-22 NOTE — Progress Notes (Addendum)
 For Anesthesia: PCP - [name], MD  Cardiologist - [name], MD . ARNETTA: 01/04/22  Bowel Prep reminder:  Chest x-ray -  EKG - 02/23/24 Stress Test - Myoview : 10/03/17 ECHO - 01/12/22 Cardiac Cath -  Pacemaker/ICD device last checked: Pacemaker orders received: Device Rep notified:  Spinal Cord Stimulator:N/A  Sleep Study - N/A CPAP -   Fasting Blood Sugar - N/A Checks Blood Sugar _____ times a day Date and result of last Hgb A1c-  Last dose of GLP1 agonist- N/A GLP1 instructions: Hold 7 days prior to schedule (Hold 24 hours-daily)   Last dose of SGLT-2 inhibitors- N/A SGLT-2 instructions: Hold 72 hours prior to surgery  Blood Thinner Instructions:N/A Last Dose: Time last taken:  Aspirin Instructions:N/A Last Dose: Time last taken:  Activity level:      Unable to go up a flight of stairs without shortness of breath    Anesthesia review: Hx: HTN,Murmur,SOB on exertion.  Patient denies shortness of breath, fever, cough and chest pain at PAT appointment   Patient verbalized understanding of instructions that were reviewed over the telephone.

## 2024-03-25 ENCOUNTER — Ambulatory Visit: Payer: Self-pay | Admitting: Gynecologic Oncology

## 2024-03-25 NOTE — Progress Notes (Signed)
 Patient here for new patient consultation with Dr. Viktoria and for a pre-operative appointment prior to her scheduled surgery on 03/27/2024. She is scheduled for robotic assisted total laparoscopic hysterectomy (removal of the uterus and cervix), bilateral salpingo-oophorectomy (removal of both ovaries and fallopian tubes), sentinel lymph node biopsy, possible lymph node dissection, possible laparotomy (larger incision on your abdomen if needed), possible omentectomy. The surgery was discussed in detail.  See after visit summary for additional details.   Discussed post-op pain management in detail including the aspects of the enhanced recovery pathway.  Advised her that a new prescription would be sent in for tramadol  and it is only to be used for after her upcoming surgery.  We discussed the use of tylenol  post-op and to monitor for a maximum of 4,000 mg in a 24 hour period.  Also prescribed sennakot to be used after surgery and to hold if having loose stools.  Discussed bowel regimen in detail.     Discussed measures to take at home to prevent DVT including frequent mobility.  Reportable signs and symptoms of DVT discussed. Post-operative instructions discussed and expectations for after surgery. Incisional care discussed as well including reportable signs and symptoms including erythema, drainage, wound separation.     10 minutes spent preparing information and with the patient.  Verbalizing understanding of material discussed. No needs or concerns voiced at the end of the visit.   Advised patient to call for any needs.  Advised that her post-operative medications had been prescribed and could be picked up at any time.    This appointment is included in the global surgical bundle as pre-operative teaching and has no charge.

## 2024-03-25 NOTE — Patient Instructions (Signed)
 Plan to have a CT scan prior to surgery. Nothing to eat or drink 4 hours before your scan and arrive 2 hours before your scan to begin drinking oral contrast.   Preparing for your Surgery  Plan for surgery on March 27, 2024 with Dr. Comer Dollar at [facility]. You will be scheduled for robotic assisted total laparoscopic hysterectomy (removal of the uterus and cervix), bilateral salpingo-oophorectomy (removal of both ovaries and fallopian tubes), sentinel lymph node biopsy, possible lymph node dissection, possible laparotomy (larger incision on your abdomen if needed), possible omentectomy.   DO NOT TAKE MOBIC  (MELOXICAM ) AT LEAST 10 DAYS BEFORE SURGERY.  Pre-operative Testing -You will receive a phone call from presurgical testing at [facility] to arrange for a pre-operative appointment and lab work.  -Bring your insurance card, copy of an advanced directive if applicable, medication list  -At that visit, you will be asked to sign a consent for a possible blood transfusion in case a transfusion becomes necessary during surgery.  The need for a blood transfusion is rare but having consent is a necessary part of your care.     -You should not be taking blood thinners or aspirin at least ten days prior to surgery unless instructed by your surgeon.  -Do not take supplements such as fish oil (omega 3), red yeast rice, turmeric before your surgery. STOP TAKING AT LEAST 10 DAYS BEFORE SURGERY. You want to avoid medications with aspirin in them including headache powders such as BC or Goody's), Excedrin migraine.  -If you are taking a GLP-1 medication/injection such as Ozempic , Mounjaro, Wegovy , this needs to be held before surgery for at least 7 days before.  Day Before Surgery at Home -You will be asked to take in a light diet the day before surgery. You will be advised you can have clear liquids up until 3 hours before your surgery.    Eat a light diet the day before  surgery.  Examples including soups, broths, toast, yogurt, mashed potatoes.  AVOID GAS PRODUCING FOODS AND BEVERAGES. Things to avoid include carbonated beverages (fizzy beverages, sodas), raw fruits and raw vegetables (uncooked), or beans.   If your bowels are filled with gas, your surgeon will have difficulty visualizing your pelvic organs which increases your surgical risks.  Your role in recovery Your role is to become active as soon as directed by your doctor, while still giving yourself time to heal.  Rest when you feel tired. You will be asked to do the following in order to speed your recovery:  - Cough and breathe deeply. This helps to clear and expand your lungs and can prevent pneumonia after surgery.  - STAY ACTIVE WHEN YOU GET HOME. Do mild physical activity. Walking or moving your legs help your circulation and body functions return to normal. Do not try to get up or walk alone the first time after surgery.   -If you develop swelling on one leg or the other, pain in the back of your leg, redness/warmth in one of your legs, please call the office or go to the Emergency Room to have a doppler to rule out a blood clot. For shortness of breath, chest pain-seek care in the Emergency Room as soon as possible. - Actively manage your pain. Managing your pain lets you move in comfort. We will ask you to rate your pain on a scale of zero to 10. It is your responsibility to tell your doctor or nurse where and how much  you hurt so your pain can be treated.  Special Considerations -If you are diabetic, you may be placed on insulin after surgery to have closer control over your blood sugars to promote healing and recovery.  This does not mean that you will be discharged on insulin.  If applicable, your oral antidiabetics will be resumed when you are tolerating a solid diet.  -Your final pathology results from surgery should be available around one week after surgery and the results will be relayed to  you when available.  -FMLA forms can be faxed to [phone] and please allow 5-7 business days for completion.  Pain Management After Surgery -You will be prescribed your pain medication and bowel regimen medications before surgery so that you can have these available when you are discharged from the hospital. The pain medication is for use ONLY AFTER surgery and a new prescription will not be given.   -Make sure that you have Tylenol  and Ibuprofen IF YOU ARE ABLE TO TAKE THESE MEDICATIONS at home to use on a regular basis after surgery for pain control. We recommend alternating the medications every hour to six hours since they work differently and are processed in the body differently for pain relief.  -Review the attached handout on narcotic use and their risks and side effects.   Bowel Regimen -You will be prescribed miralax to take nightly to prevent constipation especially if you are taking the narcotic pain medication intermittently.  It is important to prevent constipation and drink adequate amounts of liquids. You can stop taking this medication when you are not taking pain medication and you are back on your normal bowel routine.  Risks of Surgery Risks of surgery are low but include bleeding, infection, damage to surrounding structures, re-operation, blood clots, and very rarely death.   Blood Transfusion Information (For the consent to be signed before surgery)  We will be checking your blood type before surgery so in case of emergencies, we will know what type of blood you would need.                                            WHAT IS A BLOOD TRANSFUSION?  A transfusion is the replacement of blood or some of its parts. Blood is made up of multiple cells which provide different functions. Red blood cells carry oxygen  and are used for blood loss replacement. White blood cells fight against infection. Platelets control bleeding. Plasma helps clot blood. Other blood products  are available for specialized needs, such as hemophilia or other clotting disorders. BEFORE THE TRANSFUSION  Who gives blood for transfusions?  You may be able to donate blood to be used at a later date on yourself (autologous donation). Relatives can be asked to donate blood. This is generally not any safer than if you have received blood from a stranger. The same precautions are taken to ensure safety when a relative's blood is donated. Healthy volunteers who are fully evaluated to make sure their blood is safe. This is blood bank blood. Transfusion therapy is the safest it has ever been in the practice of medicine. Before blood is taken from a donor, a complete history is taken to make sure that person has no history of diseases nor engages in risky social behavior (examples are intravenous drug use or sexual activity with multiple partners). The donor's travel history is screened  to minimize risk of transmitting infections, such as malaria. The donated blood is tested for signs of infectious diseases, such as HIV and hepatitis. The blood is then tested to be sure it is compatible with you in order to minimize the chance of a transfusion reaction. If you or a relative donates blood, this is often done in anticipation of surgery and is not appropriate for emergency situations. It takes many days to process the donated blood. RISKS AND COMPLICATIONS Although transfusion therapy is very safe and saves many lives, the main dangers of transfusion include:  Getting an infectious disease. Developing a transfusion reaction. This is an allergic reaction to something in the blood you were given. Every precaution is taken to prevent this. The decision to have a blood transfusion has been considered carefully by your caregiver before blood is given. Blood is not given unless the benefits outweigh the risks.  AFTER SURGERY INSTRUCTIONS  Return to work: 4-6 weeks if applicable  Activity: 1. Be up and out of  the bed during the day.  Take a nap if needed.  You may walk up steps but be careful and use the hand rail.  Stair climbing will tire you more than you think, you may need to stop part way and rest.   2. No lifting or straining for 6 weeks over 10 pounds. No pushing, pulling, straining for 6 weeks.  3. No driving for 4-89 days when the following criteria have been met: Do not drive if you are taking narcotic pain medicine and make sure that your reaction time has returned.   4. You can shower as soon as the next day after surgery. Shower daily.  Use your regular soap and water (not directly on the incision) and pat your incision(s) dry afterwards; don't rub.  No tub baths or submerging your body in water until cleared by your surgeon. If you have the soap that was given to you by pre-surgical testing that was used before surgery, you do not need to use it afterwards because this can irritate your incisions.   5. No sexual activity and nothing in the vagina for 12 weeks.  6. You may experience a small amount of clear drainage from your incisions, which is normal.  If the drainage persists, increases, or changes color please call the office.  7. Do not use creams, lotions, or ointments such as neosporin on your incisions after surgery until advised by your surgeon because they can cause removal of the dermabond glue on your incisions.    8. You may experience vaginal spotting after surgery or when the stitches at the top of the vagina begin to dissolve.  The spotting is normal but if you experience heavy bleeding, call our office.  9. Take Tylenol  or ibuprofen first for pain if you are able to take these medications and only use narcotic pain medication for severe pain not relieved by the Tylenol  or Ibuprofen.  Monitor your Tylenol  intake to a max of 4,000 mg in a 24 hour period. You can alternate these medications after surgery.  Diet: 1. Low sodium Heart Healthy Diet is recommended but you are  cleared to resume your normal (before surgery) diet after your procedure.  2. It is safe to use a laxative, such as Miralax or Colace, if you have difficulty moving your bowels before surgery. You have been prescribed Miralax to take at bedtime every evening after surgery to keep bowel movements regular and to prevent constipation.  Wound Care: 1. Keep clean and dry.  Shower daily.  Reasons to call the Doctor: Fever - Oral temperature greater than 100.4 degrees Fahrenheit Foul-smelling vaginal discharge Difficulty urinating Nausea and vomiting Increased pain at the site of the incision that is unrelieved with pain medicine. Difficulty breathing with or without chest pain New calf pain especially if only on one side Sudden, continuing increased vaginal bleeding with or without clots.   Contacts: For questions or concerns you should contact:  Dr. Comer Dollar at [phone]  Eleanor Epps, NP at [phone]  After Hours: call [phone] and have the GYN Oncologist paged/contacted (after 5 pm or on the weekends). You will speak with an after hours RN and let he or she know you have had surgery.  Messages sent via mychart are for non-urgent matters and are not responded to after hours so for urgent needs, please call the after hours number.

## 2024-03-25 NOTE — Progress Notes (Signed)
 Could you please call her and pass along my message regarding her scan? Thank you

## 2024-03-25 NOTE — Anesthesia Preprocedure Evaluation (Signed)
 Anesthesia Evaluation    Airway        Dental   Pulmonary former smoker          Cardiovascular hypertension,      Neuro/Psych    GI/Hepatic   Endo/Other    Renal/GU      Musculoskeletal   Abdominal   Peds  Hematology   Anesthesia Other Findings   Reproductive/Obstetrics                              Anesthesia Physical Anesthesia Plan  ASA:   Anesthesia Plan:    Post-op Pain Management:    Induction:   PONV Risk Score and Plan:   Airway Management Planned:   Additional Equipment:   Intra-op Plan:   Post-operative Plan:   Informed Consent:   Plan Discussed with:   Anesthesia Plan Comments: (See PAT note 03/22/2024)        Anesthesia Quick Evaluation

## 2024-03-25 NOTE — Telephone Encounter (Signed)
 Spoke with Brandy Dean and relayed message from Dr. Viktoria that her CT scan does not show any definitive evidence of cancer spread outside of the uterus.  Pt was very pleased to hear this good! And thanked the office for calling. Pt reminded that the office will call her tomorrow for her pre-op instructions review.

## 2024-03-25 NOTE — Telephone Encounter (Signed)
-----   Message from Comer JONELLE Dollar sent at 03/25/2024  9:30 AM EDT ----- Could you please call her and pass along my message regarding her scan? Thank you ----- Message ----- From: Interface, Rad Results In Sent: 03/25/2024   8:33 AM EDT To: Comer JONELLE Dollar, MD

## 2024-03-25 NOTE — Progress Notes (Signed)
 Anesthesia Chart Review   Case: [id number] Date/Time: 03/27/24 1545   Procedures:      HYSTERECTOMY, TOTAL, LAPAROSCOPIC, WITH BILATERAL SALPINGO-OOPHORECTOMY (Bilateral) - POSSIBLE LAPAROTOMY     INJECTION, FOR SENTINEL LYMPH NODE IDENTIFICATION     LYMPH NODE BIOPSY - POSSIBLE LYMPH NODE DISSECTION     LAPAROSCOPY, OMENTECTOMY   Anesthesia type: General   Pre-op diagnosis: ENDOMETRIAL CANCER   Location: [facility] ROOM 05 / [facility]   Surgeons: Viktoria Comer SAUNDERS, MD       DISCUSSION:73 y.o. former smoker with h/o HTN, endometrial cancer scheduled for above procedure 03/27/2024 with Dr. Comer Viktoria.   Pt seen by PCP 03/21/2024 for preoperative evaluation.  Per OV note, RCRI: 0 DASI: 5.62 METS  EKG reviewed from 09/25: Sinus rhythm.  No signs of active ischemia. Recent CBC and CMP reviewed She is medically optimized with moderate risk for procedure.  Low risk stress test 2019.   Echo 01/12/2022 with EF 60-65%, trivial mitral valve regurgitation, mild aortic valve regurgitation, no aortic stenosis noted.  VS: BP (!) 153/88   Pulse (!) 55   Temp 37 C (Oral)   Ht 5' 4 (1.626 m)   Wt 108 kg   SpO2 94%   BMI 40.85 kg/m   PROVIDERS: [name], MD is PCP    LABS: Labs reviewed: Acceptable for surgery. (all labs ordered are listed, but only abnormal results are displayed)  Labs Reviewed  COMPREHENSIVE METABOLIC PANEL WITH GFR - Abnormal; Notable for the following components:      Result Value   Calcium  10.8 (*)    All other components within normal limits  CBC  TYPE AND SCREEN     IMAGES:   EKG:   CV: Echo 01/12/2022  1. Left ventricular ejection fraction, by estimation, is 60 to 65%. The  left ventricle has normal function. The left ventricle has no regional  wall motion abnormalities. There is moderate left ventricular hypertrophy.  Left ventricular diastolic  parameters are consistent with Grade I diastolic dysfunction (impaired  relaxation).   2.  Right ventricular systolic function is normal. The right ventricular  size is normal. Tricuspid regurgitation signal is inadequate for assessing  PA pressure.   3. Left atrial size was mildly dilated.   4. The mitral valve is grossly normal. Trivial mitral valve  regurgitation.   5. The aortic valve is tricuspid. There is mild calcification of the  aortic valve. There is moderate thickening of the aortic valve. Aortic  valve regurgitation is trivial. Aortic valve sclerosis/calcification is  present, without any evidence of aortic  stenosis.   6. The inferior vena cava is normal in size with greater than 50%  respiratory variability, suggesting right atrial pressure of 3 mmHg.   Myocardial Perfusion 10/03/2017 There was no ST segment deviation noted during stress. The left ventricular ejection fraction is hyperdynamic (>65%). The study is normal. There are no perfusion defects This is a low risk study.  Past Medical History:  Diagnosis Date   Bacterial pneumonia    March  9-12,  2011   Bronchial asthma    Cancer [facility])    Essential hypertension    Heart murmur    Hyperlipidemia    Morbid obesity [facility])     Past Surgical History:  Procedure Laterality Date   CESAREAN SECTION  1988 & 1991   HYSTEROSCOPY WITH D & C N/A 02/27/2024   Procedure: DILATATION AND CURETTAGE /HYSTEROSCOPY;  Surgeon: Ozan, Kioni, DO;  Location: [facility];  Service: Gynecology;  Laterality: N/A;   RIght and left keloid ears  06/13/1966    MEDICATIONS:  acetaminophen  (TYLENOL ) 500 MG tablet   Albuterol -Budesonide  (AIRSUPRA ) 90-80 MCG/ACT AERO   BREZTRI  AEROSPHERE 160-9-4.8 MCG/ACT AERO inhaler   carvedilol  (COREG ) 3.125 MG tablet   Cholecalciferol (VITAMIN D3 PO)   clotrimazole -betamethasone  (LOTRISONE ) cream   meloxicam  (MOBIC ) 15 MG tablet   polyethylene glycol (MIRALAX) 17 g packet   traMADol  (ULTRAM ) 50 MG tablet   triamterene -hydrochlorothiazide (MAXZIDE) 75-50 MG tablet   No current  facility-administered medications for this encounter.    Harlene Hoots Ward, PA-C [facility] [phone]

## 2024-03-26 ENCOUNTER — Telehealth: Payer: Self-pay | Admitting: *Deleted

## 2024-03-26 NOTE — Telephone Encounter (Signed)
Telephone call to check on pre-operative status.  Patient compliant with pre-operative instructions.  Reinforced nothing to eat after midnight. Clear liquids until 1245. Patient to arrive at 1345.  No questions or concerns voiced.  Instructed to call for any needs.

## 2024-03-26 NOTE — Discharge Instructions (Addendum)
 AFTER SURGERY INSTRUCTIONS   Return to work: 4-6 weeks if applicable  Make sure you are voiding every 3-4 hours throughout the day. Please call for any difficulty urinating.   Activity: 1. Be up and out of the bed during the day.  Take a nap if needed.  You may walk up steps but be careful and use the hand rail.  Stair climbing will tire you more than you think, you may need to stop part way and rest.    2. No lifting or straining for 6 weeks over 10 pounds. No pushing, pulling, straining for 6 weeks.   3. No driving for 4-89 days when the following criteria have been met: Do not drive if you are taking narcotic pain medicine and make sure that your reaction time has returned.    4. You can shower as soon as the next day after surgery. Shower daily.  Use your regular soap and water (not directly on the incision) and pat your incision(s) dry afterwards; don't rub.  No tub baths or submerging your body in water until cleared by your surgeon. If you have the soap that was given to you by pre-surgical testing that was used before surgery, you do not need to use it afterwards because this can irritate your incisions.    5. No sexual activity and nothing in the vagina for 12 weeks.   6. You may experience a small amount of clear drainage from your incisions, which is normal.  If the drainage persists, increases, or changes color please call the office.   7. Do not use creams, lotions, or ointments such as neosporin on your incisions after surgery until advised by your surgeon because they can cause removal of the dermabond glue on your incisions.     8. You may experience vaginal spotting after surgery or when the stitches at the top of the vagina begin to dissolve.  The spotting is normal but if you experience heavy bleeding, call our office.   9. Take Tylenol  or ibuprofen first for pain if you are able to take these medications and only use narcotic pain medication for severe pain not relieved by  the Tylenol  or Ibuprofen.  Monitor your Tylenol  intake to a max of 4,000 mg in a 24 hour period. You can alternate these medications after surgery.   Diet: 1. Low sodium Heart Healthy Diet is recommended but you are cleared to resume your normal (before surgery) diet after your procedure.   2. It is safe to use a laxative, such as Miralax or Colace, if you have difficulty moving your bowels before surgery. You have been prescribed Miralax to take at bedtime every evening after surgery to keep bowel movements regular and to prevent constipation.     Wound Care: 1. Keep clean and dry.  Shower daily.   Reasons to call the Doctor: Fever - Oral temperature greater than 100.4 degrees Fahrenheit Foul-smelling vaginal discharge Difficulty urinating Nausea and vomiting Increased pain at the site of the incision that is unrelieved with pain medicine. Difficulty breathing with or without chest pain New calf pain especially if only on one side Sudden, continuing increased vaginal bleeding with or without clots.   Contacts: For questions or concerns you should contact:   Dr. Comer Dollar at [phone]   Eleanor Epps, NP at [phone]   After Hours: call [phone] and have the GYN Oncologist paged/contacted (after 5 pm or on the weekends). You will speak with an after hours RN and  let he or she know you have had surgery.   Messages sent via mychart are for non-urgent matters and are not responded to after hours so for urgent needs, please call the after hours number.

## 2024-03-27 ENCOUNTER — Observation Stay (HOSPITAL_COMMUNITY)
Admission: RE | Admit: 2024-03-27 | Discharge: 2024-03-29 | Disposition: A | Discharge status: Home / Self Care | Attending: Gynecologic Oncology | Admitting: Gynecologic Oncology

## 2024-03-27 ENCOUNTER — Ambulatory Visit (HOSPITAL_COMMUNITY)

## 2024-03-27 ENCOUNTER — Encounter (HOSPITAL_COMMUNITY)
Admission: RE | Disposition: A | Discharge status: Home / Self Care | Payer: Self-pay | Source: Home / Self Care | Attending: Gynecologic Oncology

## 2024-03-27 ENCOUNTER — Encounter (HOSPITAL_COMMUNITY): Payer: Self-pay | Admitting: Gynecologic Oncology

## 2024-03-27 ENCOUNTER — Ambulatory Visit (HOSPITAL_COMMUNITY): Payer: Self-pay | Admitting: Physician Assistant

## 2024-03-27 DIAGNOSIS — Z90722 Acquired absence of ovaries, bilateral: Secondary | ICD-10-CM | POA: Insufficient documentation

## 2024-03-27 DIAGNOSIS — Z79899 Other long term (current) drug therapy: Secondary | ICD-10-CM | POA: Insufficient documentation

## 2024-03-27 DIAGNOSIS — Z87891 Personal history of nicotine dependence: Secondary | ICD-10-CM | POA: Insufficient documentation

## 2024-03-27 DIAGNOSIS — I10 Essential (primary) hypertension: Secondary | ICD-10-CM | POA: Insufficient documentation

## 2024-03-27 DIAGNOSIS — J45909 Unspecified asthma, uncomplicated: Secondary | ICD-10-CM | POA: Insufficient documentation

## 2024-03-27 DIAGNOSIS — C541 Malignant neoplasm of endometrium: Principal | ICD-10-CM | POA: Insufficient documentation

## 2024-03-27 DIAGNOSIS — Z90710 Acquired absence of both cervix and uterus: Secondary | ICD-10-CM | POA: Diagnosis present

## 2024-03-27 DIAGNOSIS — Z6841 Body Mass Index (BMI) 40.0 and over, adult: Secondary | ICD-10-CM

## 2024-03-27 DIAGNOSIS — K689 Other disorders of retroperitoneum: Secondary | ICD-10-CM | POA: Diagnosis not present

## 2024-03-27 HISTORY — PX: LYMPH NODE BIOPSY: SHX201

## 2024-03-27 HISTORY — PX: CYSTOSCOPY: SHX5120

## 2024-03-27 HISTORY — PX: TOTAL LAPAROSCOPIC HYSTERECTOMY WITH BILATERAL SALPINGO OOPHORECTOMY: SHX6845

## 2024-03-27 HISTORY — PX: INJECTION, FOR SENTINEL LYMPH NODE IDENTIFICATION: SHX7598

## 2024-03-27 LAB — TYPE AND SCREEN
ABO/RH(D): O POS
Antibody Screen: NEGATIVE

## 2024-03-27 LAB — ABO/RH: ABO/RH(D): O POS

## 2024-03-27 SURGERY — HYSTERECTOMY, TOTAL, LAPAROSCOPIC, WITH BILATERAL SALPINGO-OOPHORECTOMY
Anesthesia: General | Site: Pelvis

## 2024-03-27 MED ORDER — ROCURONIUM BROMIDE 100 MG/10ML IV SOLN
INTRAVENOUS | Status: DC | PRN
  Administered 2024-03-27: 10 mg via INTRAVENOUS
  Administered 2024-03-27: 100 mg via INTRAVENOUS

## 2024-03-27 MED ORDER — SUGAMMADEX SODIUM 200 MG/2ML IV SOLN
INTRAVENOUS | Status: AC
  Filled 2024-03-27: qty 2

## 2024-03-27 MED ORDER — ACETAMINOPHEN 500 MG PO TABS
1000.0000 mg | ORAL_TABLET | ORAL | Status: AC
  Administered 2024-03-27: 1000 mg via ORAL
  Filled 2024-03-27: qty 2

## 2024-03-27 MED ORDER — ONDANSETRON HCL 4 MG PO TABS: 4.0000 mg | ORAL_TABLET | Freq: Four times a day (QID) | ORAL | Status: DC | PRN

## 2024-03-27 MED ORDER — BUPIVACAINE HCL 0.25 % IJ SOLN
INTRAMUSCULAR | Status: DC | PRN
  Administered 2024-03-27: 30 mL

## 2024-03-27 MED ORDER — FENTANYL CITRATE (PF) 100 MCG/2ML IJ SOLN
INTRAMUSCULAR | Status: DC | PRN
  Administered 2024-03-27 (×4): 25 ug via INTRAVENOUS

## 2024-03-27 MED ORDER — HYDROMORPHONE HCL 1 MG/ML IJ SOLN
0.2500 mg | INTRAMUSCULAR | Status: DC | PRN
  Administered 2024-03-27 (×4): 0.5 mg via INTRAVENOUS

## 2024-03-27 MED ORDER — HYDROMORPHONE HCL 1 MG/ML IJ SOLN
INTRAMUSCULAR | Status: AC
  Filled 2024-03-27: qty 2

## 2024-03-27 MED ORDER — SIMETHICONE 80 MG PO CHEW: 80.0000 mg | CHEWABLE_TABLET | Freq: Four times a day (QID) | ORAL | Status: DC | PRN

## 2024-03-27 MED ORDER — BUPIVACAINE HCL (PF) 0.25 % IJ SOLN
INTRAMUSCULAR | Status: AC
  Filled 2024-03-27: qty 30

## 2024-03-27 MED ORDER — ONDANSETRON HCL 4 MG/2ML IJ SOLN
INTRAMUSCULAR | Status: DC | PRN
  Administered 2024-03-27: 4 mg via INTRAVENOUS

## 2024-03-27 MED ORDER — ROCURONIUM BROMIDE 10 MG/ML (PF) SYRINGE
PREFILLED_SYRINGE | INTRAVENOUS | Status: AC
  Filled 2024-03-27: qty 10

## 2024-03-27 MED ORDER — AMISULPRIDE (ANTIEMETIC) 5 MG/2ML IV SOLN
10.0000 mg | Freq: Once | INTRAVENOUS | Status: AC | PRN
  Administered 2024-03-27: 10 mg via INTRAVENOUS

## 2024-03-27 MED ORDER — CEFAZOLIN SODIUM-DEXTROSE 2-4 GM/100ML-% IV SOLN: 2.0000 g | INTRAVENOUS | Status: DC

## 2024-03-27 MED ORDER — STERILE WATER FOR INJECTION IJ SOLN
INTRAMUSCULAR | Status: DC | PRN
  Administered 2024-03-27: 4 mL via INTRAMUSCULAR

## 2024-03-27 MED ORDER — PROPOFOL 10 MG/ML IV BOLUS
INTRAVENOUS | Status: DC | PRN
  Administered 2024-03-27: 120 mg via INTRAVENOUS

## 2024-03-27 MED ORDER — FENTANYL CITRATE (PF) 100 MCG/2ML IJ SOLN
INTRAMUSCULAR | Status: AC
  Filled 2024-03-27: qty 2

## 2024-03-27 MED ORDER — SENNA 8.6 MG PO TABS
1.0000 | ORAL_TABLET | Freq: Every day | ORAL | Status: DC
  Administered 2024-03-27 – 2024-03-28 (×2): 8.6 mg via ORAL
  Filled 2024-03-27 (×2): qty 1

## 2024-03-27 MED ORDER — ACETAMINOPHEN 500 MG PO TABS: 1000.0000 mg | ORAL_TABLET | ORAL | Status: DC

## 2024-03-27 MED ORDER — PROPOFOL 10 MG/ML IV BOLUS
INTRAVENOUS | Status: AC
  Filled 2024-03-27: qty 20

## 2024-03-27 MED ORDER — LACTATED RINGERS IV SOLN: INTRAVENOUS | Status: DC

## 2024-03-27 MED ORDER — STERILE WATER FOR IRRIGATION IR SOLN
Status: DC | PRN
  Administered 2024-03-27: 1000 mL

## 2024-03-27 MED ORDER — PROPOFOL 500 MG/50ML IV EMUL
INTRAVENOUS | Status: DC | PRN
  Administered 2024-03-27: 75 ug/kg/min via INTRAVENOUS

## 2024-03-27 MED ORDER — ORAL CARE MOUTH RINSE: 15.0000 mL | Freq: Once | OROMUCOSAL | Status: AC

## 2024-03-27 MED ORDER — CEFAZOLIN SODIUM-DEXTROSE 2-4 GM/100ML-% IV SOLN
2.0000 g | INTRAVENOUS | Status: AC
  Administered 2024-03-27: 2 g via INTRAVENOUS
  Filled 2024-03-27: qty 100

## 2024-03-27 MED ORDER — SODIUM CHLORIDE (PF) 0.9 % IJ SOLN
INTRAMUSCULAR | Status: AC
  Filled 2024-03-27: qty 20

## 2024-03-27 MED ORDER — LACTATED RINGERS IR SOLN
Status: DC | PRN
  Administered 2024-03-27: 1000 mL

## 2024-03-27 MED ORDER — DEXAMETHASONE SOD PHOSPHATE PF 10 MG/ML IJ SOLN
4.0000 mg | INTRAMUSCULAR | Status: AC
  Administered 2024-03-27: 4 mg via INTRAVENOUS

## 2024-03-27 MED ORDER — STERILE WATER FOR INJECTION IJ SOLN
INTRAMUSCULAR | Status: AC
  Filled 2024-03-27: qty 10

## 2024-03-27 MED ORDER — CHLORHEXIDINE GLUCONATE 0.12 % MT SOLN
15.0000 mL | Freq: Once | OROMUCOSAL | Status: AC
  Administered 2024-03-27: 15 mL via OROMUCOSAL

## 2024-03-27 MED ORDER — GLYCOPYRROLATE 0.2 MG/ML IJ SOLN
INTRAMUSCULAR | Status: DC | PRN
  Administered 2024-03-27: .2 mg via INTRAVENOUS

## 2024-03-27 MED ORDER — HEPARIN SODIUM (PORCINE) 5000 UNIT/ML IJ SOLN
5000.0000 [IU] | INTRAMUSCULAR | Status: AC
  Administered 2024-03-27: 5000 [IU] via SUBCUTANEOUS
  Filled 2024-03-27: qty 1

## 2024-03-27 MED ORDER — DEXAMETHASONE SOD PHOSPHATE PF 10 MG/ML IJ SOLN: 4.0000 mg | INTRAMUSCULAR | Status: DC

## 2024-03-27 MED ORDER — LIDOCAINE HCL (PF) 2 % IJ SOLN
INTRAMUSCULAR | Status: AC
  Filled 2024-03-27: qty 5

## 2024-03-27 MED ORDER — TRAMADOL HCL 50 MG PO TABS
50.0000 mg | ORAL_TABLET | Freq: Four times a day (QID) | ORAL | Status: DC | PRN
  Administered 2024-03-28 – 2024-03-29 (×4): 50 mg via ORAL
  Filled 2024-03-27 (×4): qty 1

## 2024-03-27 MED ORDER — LIDOCAINE HCL (CARDIAC) PF 100 MG/5ML IV SOSY
PREFILLED_SYRINGE | INTRAVENOUS | Status: DC | PRN
  Administered 2024-03-27: 100 mg via INTRAVENOUS

## 2024-03-27 MED ORDER — ONDANSETRON HCL 4 MG/2ML IJ SOLN: 4.0000 mg | Freq: Four times a day (QID) | INTRAMUSCULAR | Status: DC | PRN

## 2024-03-27 MED ORDER — OXYCODONE HCL 5 MG PO TABS: 5.0000 mg | ORAL_TABLET | ORAL | Status: DC | PRN

## 2024-03-27 MED ORDER — PROPOFOL 1000 MG/100ML IV EMUL
INTRAVENOUS | Status: AC
  Filled 2024-03-27: qty 100

## 2024-03-27 MED ORDER — AMISULPRIDE (ANTIEMETIC) 5 MG/2ML IV SOLN
INTRAVENOUS | Status: AC
  Filled 2024-03-27: qty 4

## 2024-03-27 MED ORDER — BUPIVACAINE LIPOSOME 1.3 % IJ SUSP
INTRAMUSCULAR | Status: AC
  Filled 2024-03-27: qty 20

## 2024-03-27 MED ORDER — PROPOFOL 500 MG/50ML IV EMUL
INTRAVENOUS | Status: AC
  Filled 2024-03-27: qty 50

## 2024-03-27 MED ORDER — SUGAMMADEX SODIUM 200 MG/2ML IV SOLN
INTRAVENOUS | Status: DC | PRN
  Administered 2024-03-27: 200 mg via INTRAVENOUS

## 2024-03-27 MED ORDER — ONDANSETRON HCL 4 MG/2ML IJ SOLN
INTRAMUSCULAR | Status: AC
  Filled 2024-03-27: qty 2

## 2024-03-27 SURGICAL SUPPLY — 73 items
APPLICATOR ARISTA FLEXITIP XL (MISCELLANEOUS) ×1 IMPLANT
APPLICATOR SURGIFLO ENDO (HEMOSTASIS) IMPLANT
BAG COUNTER SPONGE SURGICOUNT (BAG) IMPLANT
BAG LAPAROSCOPIC 12 15 PORT 16 (BASKET) IMPLANT
BLADE SURG SZ10 CARB STEEL (BLADE) IMPLANT
COVER BACK TABLE 60X90IN (DRAPES) ×6 IMPLANT
COVER TIP SHEARS 8 DVNC (MISCELLANEOUS) ×6 IMPLANT
DERMABOND ADVANCED .7 DNX12 (GAUZE/BANDAGES/DRESSINGS) ×6 IMPLANT
DRAPE ARM DVNC X/XI (DISPOSABLE) ×24 IMPLANT
DRAPE COLUMN DVNC XI (DISPOSABLE) ×6 IMPLANT
DRAPE SHEET LG 3/4 BI-LAMINATE (DRAPES) ×6 IMPLANT
DRAPE SURG IRRIG POUCH 19X23 (DRAPES) ×6 IMPLANT
DRIVER NDL MEGA SUTCUT DVNCXI (INSTRUMENTS) ×5 IMPLANT
DRIVER NDLE MEGA SUTCUT DVNCXI (INSTRUMENTS) ×5 IMPLANT
DRSG OPSITE POSTOP 4X6 (GAUZE/BANDAGES/DRESSINGS) IMPLANT
DRSG OPSITE POSTOP 4X8 (GAUZE/BANDAGES/DRESSINGS) IMPLANT
ELECT PENCIL ROCKER SW 15FT (MISCELLANEOUS) IMPLANT
ELECT REM PT RETURN 15FT ADLT (MISCELLANEOUS) ×6 IMPLANT
FORCEPS BPLR FENES DVNC XI (FORCEP) ×6 IMPLANT
FORCEPS PROGRASP DVNC XI (FORCEP) ×6 IMPLANT
GAUZE 4X4 16PLY ~~LOC~~+RFID DBL (SPONGE) ×12 IMPLANT
GLOVE BIO SURGEON STRL SZ 6 (GLOVE) ×24 IMPLANT
GLOVE BIO SURGEON STRL SZ 6.5 (GLOVE) ×6 IMPLANT
GOWN STRL REUS W/ TWL LRG LVL3 (GOWN DISPOSABLE) ×24 IMPLANT
GRASPER SUT TROCAR 14GX15 (MISCELLANEOUS) ×1 IMPLANT
HEMOSTAT ARISTA ABSORB 3G PWDR (HEMOSTASIS) ×1 IMPLANT
HOLDER FOLEY CATH W/STRAP (MISCELLANEOUS) IMPLANT
IRRIGATION SUCT STRKRFLW 2 WTP (MISCELLANEOUS) ×6 IMPLANT
KIT PROCEDURE DVNC SI (MISCELLANEOUS) IMPLANT
KIT TURNOVER KIT A (KITS) ×6 IMPLANT
LIGASURE IMPACT 36 18CM CVD LR (INSTRUMENTS) IMPLANT
LIGASURE LAP MARYLAND 5MM 37CM (ELECTROSURGICAL) ×1 IMPLANT
MANIPULATOR ADVINCU DEL 3.0 PL (MISCELLANEOUS) IMPLANT
MANIPULATOR ADVINCU DEL 3.5 PL (MISCELLANEOUS) IMPLANT
MANIPULATOR UTERINE 4.5 ZUMI (MISCELLANEOUS) IMPLANT
NDL HYPO 21X1.5 SAFETY (NEEDLE) ×5 IMPLANT
NDL SPNL 18GX3.5 QUINCKE PK (NEEDLE) IMPLANT
NEEDLE HYPO 21X1.5 SAFETY (NEEDLE) ×5 IMPLANT
NEEDLE SPNL 18GX3.5 QUINCKE PK (NEEDLE) IMPLANT
OBTURATOR OPTICALSTD 8 DVNC (TROCAR) ×6 IMPLANT
PACK ROBOT GYN CUSTOM WL (TRAY / TRAY PROCEDURE) ×6 IMPLANT
PAD POSITIONING PINK XL (MISCELLANEOUS) ×6 IMPLANT
PORT ACCESS TROCAR AIRSEAL 12 (TROCAR) ×1 IMPLANT
SCISSORS LAP 5X45 EPIX DISP (ENDOMECHANICALS) IMPLANT
SCISSORS MNPLR CVD DVNC XI (INSTRUMENTS) ×6 IMPLANT
SCRUB CHG 4% DYNA-HEX 4OZ (MISCELLANEOUS) IMPLANT
SEAL UNIV 5-12 XI (MISCELLANEOUS) ×24 IMPLANT
SET TRI-LUMEN FLTR TB AIRSEAL (TUBING) ×6 IMPLANT
SPIKE FLUID TRANSFER (MISCELLANEOUS) ×6 IMPLANT
SPONGE T-LAP 18X18 ~~LOC~~+RFID (SPONGE) IMPLANT
SURGIFLO W/THROMBIN 8M KIT (HEMOSTASIS) IMPLANT
SUT MNCRL AB 4-0 PS2 18 (SUTURE) IMPLANT
SUT PDS AB 1 TP1 96 (SUTURE) IMPLANT
SUT STRATA PDS 0 30 CT-2.5 (SUTURE) IMPLANT
SUT V-LOC 180 0-0 GS22 (SUTURE) IMPLANT
SUT VIC AB 0 CT1 27XBRD ANTBC (SUTURE) IMPLANT
SUT VIC AB 2-0 CT1 TAPERPNT 27 (SUTURE) IMPLANT
SUT VIC AB 2-0 SH 27X BRD (SUTURE) IMPLANT
SUT VIC AB 4-0 PS2 18 (SUTURE) ×12 IMPLANT
SUT VICRYL 0 27 CT2 27 ABS (SUTURE) ×6 IMPLANT
SUT VLOC 180 0 9IN GS21 (SUTURE) IMPLANT
SUTURE STRATFX 0 PDS+ CT-2 23 (SUTURE) IMPLANT
SYR 10ML LL (SYRINGE) IMPLANT
SYSTEM BAG RETRIEVAL 10MM (BASKET) IMPLANT
SYSTEM RETRIEVL 5MM INZII UNIV (BASKET) IMPLANT
SYSTEM WOUND ALEXIS 18CM MED (MISCELLANEOUS) IMPLANT
TRAP SPECIMEN MUCUS 40CC (MISCELLANEOUS) IMPLANT
TRAY FOLEY MTR SLVR 16FR STAT (SET/KITS/TRAYS/PACK) ×6 IMPLANT
TROCAR PORT AIRSEAL 5X120 (TROCAR) IMPLANT
TROCAR XCEL NON-BLD 5MMX100MML (ENDOMECHANICALS) ×6 IMPLANT
UNDERPAD 30X36 HEAVY ABSORB (UNDERPADS AND DIAPERS) ×12 IMPLANT
WATER STERILE IRR 1000ML POUR (IV SOLUTION) ×6 IMPLANT
YANKAUER SUCT BULB TIP 10FT TU (MISCELLANEOUS) IMPLANT

## 2024-03-27 NOTE — Progress Notes (Signed)
 O2 sat dropped with Room air, notified Dr. Viktoria, observation for tonight. Choldren @ bedside, notified them .  Alert Orientx4,  ate 3packs of crackers and drank gingerale .

## 2024-03-27 NOTE — Interval H&P Note (Signed)
 History and Physical Interval Note:  03/27/2024 2:01 PM  Brandy Dean  has presented today for surgery, with the diagnosis of ENDOMETRIAL CANCER.  The various methods of treatment have been discussed with the patient and family. After consideration of risks, benefits and other options for treatment, the patient has consented to  Procedure(s) with comments: HYSTERECTOMY, TOTAL, LAPAROSCOPIC, WITH BILATERAL SALPINGO-OOPHORECTOMY (Bilateral) - POSSIBLE LAPAROTOMY INJECTION, FOR SENTINEL LYMPH NODE IDENTIFICATION (N/A) LYMPH NODE BIOPSY (N/A) - POSSIBLE LYMPH NODE DISSECTION LAPAROSCOPY, OMENTECTOMY (N/A) as a surgical intervention.  The patient's history has been reviewed, patient examined, no change in status, stable for surgery.  I have reviewed the patient's chart and labs.  Questions were answered to the patient's satisfaction.     Comer JONELLE Dollar

## 2024-03-27 NOTE — Anesthesia Procedure Notes (Signed)
 Procedure Name: Intubation Date/Time: 03/27/2024 3:45 PM  Performed by: Gladis Honey, CRNAPre-anesthesia Checklist: Patient identified, Emergency Drugs available, Suction available and Patient being monitored Patient Re-evaluated:Patient Re-evaluated prior to induction Oxygen  Delivery Method: Circle System Utilized Preoxygenation: Pre-oxygenation with 100% oxygen  Induction Type: IV induction Ventilation: Mask ventilation without difficulty Laryngoscope Size: Glidescope and 3 Grade View: Grade I Tube type: Oral Tube size: 7.0 mm Number of attempts: 2 Airway Equipment and Method: Stylet and Oral airway Placement Confirmation: ETT inserted through vocal cords under direct vision, positive ETCO2 and breath sounds checked- equal and bilateral Secured at: 22 cm Tube secured with: Tape Dental Injury: Teeth and Oropharynx as per pre-operative assessment  Comments: First attempt with miller 2, grade 3 view. Easy mask. Intubated using glidescope without difficulty.

## 2024-03-27 NOTE — Anesthesia Postprocedure Evaluation (Signed)
 Anesthesia Post Note  Patient: Brandy Dean  Procedure(s) Performed: HYSTERECTOMY, TOTAL, LAPAROSCOPIC, WITH BILATERAL SALPINGO-OOPHORECTOMY (Bilateral: Abdomen) INJECTION, FOR SENTINEL LYMPH NODE IDENTIFICATION (Pelvis) LYMPH NODE BIOPSY (Abdomen) CYSTOSCOPY (Bladder)     Patient location during evaluation: PACU Anesthesia Type: General Level of consciousness: awake and alert Pain management: pain level controlled Vital Signs Assessment: post-procedure vital signs reviewed and stable Respiratory status: spontaneous breathing, nonlabored ventilation and respiratory function stable Cardiovascular status: blood pressure returned to baseline and stable Postop Assessment: no apparent nausea or vomiting Anesthetic complications: no   No notable events documented.  Last Vitals:  Vitals:   03/27/24 1823 03/27/24 1830  BP: 101/79 112/73  Pulse: 68 (!) 50  Resp: 20 (!) 21  Temp: (!) 36.3 C   SpO2: 100% 98%    Last Pain:  Vitals:   03/27/24 1823  TempSrc:   PainSc: 0-No pain                 Butler Levander Pinal

## 2024-03-27 NOTE — Op Note (Signed)
 OPERATIVE NOTE  Pre-operative Diagnosis: endometrial cancer, high grade, endometrioid  Post-operative Diagnosis: same, significant retroperitoneal adiposity and narrow pelvis  Operation: Robotic-assisted laparoscopic total hysterectomy with bilateral salpingoophorectomy, SLN biopsy bilaterally, cystoscopy Morbid obesity requiring additional OR personnel for positioning and retraction. Obesity made retroperitoneal visualization limited and increased the complexity of the case and necessitated additional instrumentation for retraction. Obesity related complexity increased the duration of the procedure by 60 minutes.    Surgeon: Viktoria Crank MD  Assistant Surgeon: Eleanor Epps, NP (an NP assistant was necessary for tissue manipulation, management of robotic instrumentation, retraction and positioning due to the complexity of the case and hospital policies).   Anesthesia: GET  Urine Output: 100 cc  Operative Findings: On EUA, moderately mobile uterus. On intra-abdomina entry, omentum adherent to the abdominal wall in the midline from just superior to the umbilicus to above the falciform ligament. Normal upper abdominal survey. Normal appearing small and large bowel, omentum. Uterus 10 cm and normal in appearance with some fibrous adhesions to the anterior abdominal wall c/w prior c-sections. Normal appearing bilateral tubes and ovaries. SLN mapping successful to bilateral deep obturator SLNs. No ascites. No obvious adenopathy. On cystoscopy, dome intact, two ureteral orifices on the right. Good efflux noted from all three ureteral orifices.   Estimated Blood Loss:  100 cc      Total IV Fluids: see I&O flowsheet         Specimens: uterus, cervix, bilateral tubes and ovaries, bilateral obturator SLNs         Complications:  None apparent; patient tolerated the procedure well.         Disposition: PACU - hemodynamically stable.  Procedure Details  The patient was seen in the Holding  Room. The risks, benefits, complications, treatment options, and expected outcomes were discussed with the patient.  The patient concurred with the proposed plan, giving informed consent.  The site of surgery properly noted/marked. The patient was identified as Hanadi Stanly and the procedure verified as a Robotic-assisted hysterectomy with bilateral salpingo oophorectomy with SLN biopsy.   After induction of anesthesia, the patient was draped and prepped in the usual sterile manner. Patient was placed in supine position after anesthesia and draped and prepped in the usual sterile manner as follows: Her arms were tucked to her side with all appropriate precautions.  The patient was secured to the bed using padding and tape across her chest.  The patient was placed in the semi-lithotomy position in [place] stirrups.  The perineum and vagina were prepped with CHG. The patient's abdomen was prepped with ChloraPrep and she was draped after the prep had been allowed to dry for 3 minutes.  A Time Out was held and the above information confirmed.  The urethra was prepped with Betadine . Foley catheter was placed.  A sterile speculum was placed in the vagina.  The cervix was grasped with a single-tooth tenaculum. 2mg  total of ICG was injected into the cervical stroma at 2 and 9 o'clock with 1cc injected at a 1cm and 2mm depth (concentration 0.5mg /ml) in all locations. The cervix was dilated with Fredirick dilators.  The ZUMI uterine manipulator with a medium colpotomizer ring was placed without difficulty.  A pneumo-occluder balloon was placed over the manipulator.  OG tube placement was confirmed and to suction.   Next, a 10 mm skin incision was made 1 cm below the subcostal margin in the midclavicular line.  The 5 mm Optiview port and scope was used for direct entry.  Opening  pressure was under 10 mm CO2.  The abdomen was insufflated and the findings were noted as above. At this point and all points during the procedure,  the patient's intra-abdominal pressure did not exceed 15 mmHg. Next, an 8 mm skin incision was made in the left abdomen. The laparoscopic ligasure was then used to lyse adhesions of the omentum to the anterior abdominal wall to allow for port placement in the mid abdomen. Incisions were then made just inferior to the umbilicus and two on the right (one mid-abdomen and one lateral abdomen). 8 mm robotic trocars were then placed.  The 5 mm assist trocar was exchanged for a 12 airseal mm port. All ports were placed under direct visualization.  The patient was placed in steep Trendelenburg.  Bowel was folded away into the upper abdomen.  The robot was docked in the normal manner.  The right and left peritoneum were opened parallel to the IP ligament to open the retroperitoneal spaces bilaterally. The round ligaments were transected. The SLN mapping was performed in bilateral pelvic basins. After identifying the ureters, the para rectal and paravesical spaces were opened up entirely with careful dissection below the level of the ureters bilaterally and to the depth of the uterine artery origin in order to skeletonize the uterine web and ensure visualization of all parametrial channels. The para-aortic basins were carefully exposed and evaluated for isolated para-aortic SLN's. Lymphatic channels were identified travelling to the following visualized sentinel lymph node's: bilateral obturator SLNs. Given narrow pelvis, decision made to proceed with hysterectomy and to remove the SLNs after.   The hysterectomy was started.  The ureter was again noted to be on the medial leaf of the broad ligament.  The peritoneum above the ureter was incised and stretched and the infundibulopelvic ligament was skeletonized, cauterized and cut.  The posterior peritoneum was taken down to the level of the KOH ring.  The fibrous adhesion from the uterine body to the anterior abdominal wall was lysed sharply. The anterior peritoneum was  also taken down.  The bladder flap was created to the level of the KOH ring with some difficulty given narrow pelvis and adhesions from c-sections.  The uterine artery on the right side was skeletonized, cauterized and cut in the normal manner.  A similar procedure was performed on the left.  The colpotomy was made and the uterus, cervix, bilateral ovaries and tubes were amputated and delivered through the vagina.  Pedicles were inspected and excellent hemostasis was achieved.    The colpotomy at the vaginal cuff was closed with 0 Vicryl with a figure of eight and 0 V-Loc to close the midportion of the cuff in two layers in a running manner.    The previously identified SLNs were separated from their surrounding lymphatic tissue, removed and sent for permanent pathology.  Cystoscopy was performed with findings noted above. Foley catheter was not replaced.   Irrigation was used and excellent hemostasis was achieved. Arista was placed over the surgical beds. At this point in the procedure was completed.  Robotic instruments were removed under direct visulaization.  The robot was undocked. The fascia at the 10-12 mm port was closed with 0 Vicryl using a PMI fasial closure device.  The subcuticular tissue was closed with 4-0 Vicryl and the skin was closed with 4-0 Monocryl in a subcuticular manner.  Dermabond was applied.    The vagina was swabbed with  minimal bleeding noted. All sponge, lap and needle counts were correct x  3.   The patient was transferred to the recovery room in stable condition.  Comer Dollar, MD

## 2024-03-27 NOTE — Plan of Care (Signed)

## 2024-03-27 NOTE — Transfer of Care (Signed)
 Immediate Anesthesia Transfer of Care Note  Patient: Brandy Dean  Procedure(s) Performed: HYSTERECTOMY, TOTAL, LAPAROSCOPIC, WITH BILATERAL SALPINGO-OOPHORECTOMY (Bilateral: Abdomen) INJECTION, FOR SENTINEL LYMPH NODE IDENTIFICATION (Pelvis) LYMPH NODE BIOPSY (Abdomen) CYSTOSCOPY (Bladder)  Patient Location: PACU  Anesthesia Type:General  Level of Consciousness: alert , oriented, and drowsy  Airway & Oxygen  Therapy: Patient Spontanous Breathing and Patient connected to face mask oxygen   Post-op Assessment: Report given to RN and Post -op Vital signs reviewed and stable  Post vital signs: Reviewed and stable  Last Vitals:  Vitals Value Taken Time  BP 101/79 03/27/24 18:23  Temp    Pulse 104 03/27/24 18:29  Resp 19 03/27/24 18:29  SpO2 92 % 03/27/24 18:29  Vitals shown include unfiled device data.  Last Pain:  Vitals:   03/27/24 1346  TempSrc: Oral  PainSc: 0-No pain         Complications: No notable events documented.

## 2024-03-28 ENCOUNTER — Encounter (HOSPITAL_COMMUNITY): Payer: Self-pay | Admitting: Gynecologic Oncology

## 2024-03-28 ENCOUNTER — Other Ambulatory Visit: Payer: Self-pay

## 2024-03-28 DIAGNOSIS — C541 Malignant neoplasm of endometrium: Secondary | ICD-10-CM | POA: Diagnosis not present

## 2024-03-28 LAB — BASIC METABOLIC PANEL WITH GFR
Anion gap: 6 (ref 5–15)
BUN: 11 mg/dL (ref 8–23)
CO2: 29 mmol/L (ref 22–32)
Calcium: 9.9 mg/dL (ref 8.9–10.3)
Chloride: 101 mmol/L (ref 98–111)
Creatinine, Ser: 0.63 mg/dL (ref 0.44–1.00)
GFR, Estimated: >60 mL/min (ref >60)
Glucose, Bld: 100 mg/dL — ABNORMAL HIGH (ref 70–99)
Potassium: 4.8 mmol/L (ref 3.5–5.1)
Sodium: 136 mmol/L (ref 135–145)

## 2024-03-28 LAB — CBC
HCT: 38.6 % (ref 36.0–46.0)
Hemoglobin: 12.1 g/dL (ref 12.0–15.0)
MCH: 27.3 pg (ref 26.0–34.0)
MCHC: 31.3 g/dL (ref 30.0–36.0)
MCV: 86.9 fL (ref 80.0–100.0)
Platelets: 194 K/uL (ref 150–400)
RBC: 4.44 MIL/uL (ref 3.87–5.11)
RDW: 14.4 % (ref 11.5–15.5)
WBC: 11.4 K/uL — ABNORMAL HIGH (ref 4.0–10.5)
nRBC: 0 % (ref 0.0–0.2)

## 2024-03-28 MED ORDER — CHLORHEXIDINE GLUCONATE CLOTH 2 % EX PADS
6.0000 | MEDICATED_PAD | Freq: Every day | CUTANEOUS | Status: DC
  Administered 2024-03-28: 6 via TOPICAL

## 2024-03-28 NOTE — Plan of Care (Signed)

## 2024-03-28 NOTE — Progress Notes (Signed)
 GYN Oncology Progress Note  Patient is alert, oriented, sitting on side of bed with son at the bedside. Patient has less than 10 cc in the measuring hat and a small amount of urine in the toilet. Voiding trial performed earlier this afternoon. Voiding trial failed per Dr. Viktoria since patient unable to void after instillation of 300 cc of saline shortly after. Foley needs to be replaced. Order will be placed. RN notified.  Spoke with patient. She states she does not want to go home with the foley since she has difficulty with use of her arms after having COVID shots. Advised we would attempt the voiding trial again in the morning. Agreeable with plan.   Update: After chat discussion with Dr. Viktoria and RN, in and out cath to see how much is in the patient's bladder presented as an option.

## 2024-03-28 NOTE — Progress Notes (Signed)
   03/28/24 1114  TOC Brief Assessment  Insurance and Status Reviewed  Patient has primary care physician Yes  Home environment has been reviewed resides in a private residence  Prior level of function: Independent  Prior/Current Home Services No current home services  Social Drivers of Health Review SDOH reviewed no interventions necessary  Readmission risk has been reviewed Yes  Transition of care needs no transition of care needs at this time

## 2024-03-28 NOTE — Progress Notes (Addendum)
 Backfill of 300 instilled at 1330, Foley clamped and pulled at that time.   Patient began voiding at 1420 in the bathroom with a hat, unfortunately was not measurable due to missing the hat. Void was audible by RN and son.  Bladder scan was performed 3 times with a result of 0.  Patient was in good positioning for bladder placement and pubic bone was present on every scan with pannus out of the way.  I&O cath performed to confirm PVR, with a result of 40. Foley was used for I&O to prevent a possible 2nd insertion.   MD notified and communicated with during entire process, MD spoke with patient and decided will spend the night with Foley and do a 2nd Void Trial in the AM.

## 2024-03-28 NOTE — Progress Notes (Signed)
 1 Day Post-Op Procedure(s) (LRB): HYSTERECTOMY, TOTAL, LAPAROSCOPIC, WITH BILATERAL SALPINGO-OOPHORECTOMY (Bilateral) INJECTION, FOR SENTINEL LYMPH NODE IDENTIFICATION (N/A) LYMPH NODE BIOPSY (N/A) CYSTOSCOPY  Subjective: Patient reports doing well this am. No nausea or emesis. Tolerating graham crackers this am and waiting for breakfast. No pain reported except for irritation from the catheter. She ambulated in the halls and tolerated this well. Passed small amount of flatus. Denies chest pain, dyspnea. Getting to 1500 on IS. Family at the bedside.  Objective: Vital signs in last 24 hours: Temp:  [97.4 F (36.3 C)-98.7 F (37.1 C)] 97.7 F (36.5 C) (10/16 0359) Pulse Rate:  [44-107] 57 (10/16 0359) Resp:  [14-23] 18 (10/16 0359) BP: (101-165)/(65-131) 135/103 (10/16 0359) SpO2:  [79 %-100 %] 98 % (10/16 0600) Weight:  [238 lb (108 kg)] 238 lb (108 kg) (10/15 2110)    Intake/Output from previous day: 10/15 0701 - 10/16 0700 In: 2000 [P.O.:900; I.V.:1000; IV Piggyback:100] Out: 925 [Urine:900; Blood:25]  Physical Examination: General: alert, cooperative, and no distress Resp: clear to auscultation bilaterally Cardio: regular rate and rhythm, S1, S2 normal, no murmur, click, rub or gallop GI: soft, non-tender; bowel sounds normal; no masses,  no organomegaly and incision: lap sites to the abdomen intact with dermabond without erythema or drainage Extremities: extremities normal, atraumatic, no cyanosis or edema SCDS on. Foley with clear, yellow urine.  Labs: WBC/Hgb/Hct/Plts:  11.4/12.1/38.6/194 (10/16 9353) BUN/Cr/glu/ALT/AST/amyl/lip:  11/0.63/--/--/--/--/-- (10/16 9353)  Assessment: 73 y.o. s/p Procedure(s): HYSTERECTOMY, TOTAL, LAPAROSCOPIC, WITH BILATERAL SALPINGO-OOPHORECTOMY INJECTION, FOR SENTINEL LYMPH NODE IDENTIFICATION LYMPH NODE BIOPSY CYSTOSCOPY: stable Pain:  Pain is well-controlled on PRN medications.  Heme: Hgb 12.1 an Hct 38.6 this am. Appropriate given  preop values, surgical losses  ID: WBC 11.4 this am. Given intra-op abx and decadron . No evidence of infection. Afebrile.  CV: BP and HR overall stable. Continue to monitor with routine vital signs.  GI:  Tolerating po: yes. Antiemetics ordered if needed.  GU: Foley in place due to retention post-op. Plan for active voiding trial later today. Creatinine 0.63 and adequate output reported.    FEN: No critical values on am labs.  Prophylaxis: SCDs ordered.  Plan: Plan for active voiding trial later this am. If able to void and meeting milestones, plan for discharge. If unable to void, discussed with patient having the foley replaced and staying another day for a voiding trial Friday am or being discharged today with the foley in place for several days with removal in the office. Patient would prefer not to be discharged home with foley.   Continue with current plan of care. Will await results of voiding trial.    LOS: 0 days    Brandy Dean 03/28/2024, 8:51 AM

## 2024-03-29 DIAGNOSIS — C541 Malignant neoplasm of endometrium: Secondary | ICD-10-CM | POA: Diagnosis not present

## 2024-03-29 NOTE — Progress Notes (Signed)
 Reviewed written d/c instructions w patient, all questions answered and she verbalized understanding. D/C via w/c w all belongings in stable condition

## 2024-03-29 NOTE — Discharge Summary (Signed)
 Physician Discharge Summary  Patient ID: Brandy Dean MRN: [id number] DOB/AGE: [birth year] 73 y.o.  Admit date: 03/27/2024 Discharge date: 03/29/2024  Admission Diagnoses: S/P hysterectomy with oophorectomy  Discharge Diagnoses:  Principal Problem:   S/P hysterectomy with oophorectomy   Discharged Condition:  The patient is in good condition and stable for discharge.    Hospital Course: On 03/27/2024, the patient underwent the following: Procedure(s): HYSTERECTOMY, TOTAL, LAPAROSCOPIC, WITH BILATERAL SALPINGO-OOPHORECTOMY INJECTION, FOR SENTINEL LYMPH NODE IDENTIFICATION, LYMPH NODE BIOPSY, CYSTOSCOPY.   The postoperative course included a prolonged stay due to urinary retention. She had a voiding trial on POD 1, had foley replaced, and passed a voiding trial morning of POD 2.  She was discharged to home on postoperative day 2 tolerating a regular diet, voiding after foley removal and voiding trial, ambulating, passing flatus, pain minimal.  Consults: None  Significant Diagnostic Studies: Labs  Treatments: surgery:see above  Discharge Exam (performed by Dr. Viktoria): Blood pressure 107/64, pulse 71, temperature 98.8 F (37.1 C), temperature source Oral, resp. rate 16, height 5' 4 (1.626 m), weight 238 lb (108 kg), SpO2 93%. General appearance: alert, cooperative, and no distress Resp: clear to auscultation bilaterally Cardio: regular rate and rhythm, S1, S2 normal, no murmur, click, rub or gallop GI: soft, non-tender; bowel sounds normal; no masses,  no organomegaly Extremities: extremities normal, atraumatic, no cyanosis or edema Incision/Wound: lap sites to the abdomen with dermabond without erythema or drainage  Disposition: Discharge disposition: 01-Home or Self Care       Discharge Instructions     Call MD for:  difficulty breathing, headache or visual disturbances   Complete by: As directed    Call MD for:  difficulty breathing, headache or visual disturbances    Complete by: As directed    Call MD for:  extreme fatigue   Complete by: As directed    Call MD for:  extreme fatigue   Complete by: As directed    Call MD for:  hives   Complete by: As directed    Call MD for:  hives   Complete by: As directed    Call MD for:  persistant dizziness or light-headedness   Complete by: As directed    Call MD for:  persistant dizziness or light-headedness   Complete by: As directed    Call MD for:  persistant nausea and vomiting   Complete by: As directed    Call MD for:  persistant nausea and vomiting   Complete by: As directed    Call MD for:  redness, tenderness, or signs of infection (pain, swelling, redness, odor or green/yellow discharge around incision site)   Complete by: As directed    Call MD for:  redness, tenderness, or signs of infection (pain, swelling, redness, odor or green/yellow discharge around incision site)   Complete by: As directed    Call MD for:  severe uncontrolled pain   Complete by: As directed    Call MD for:  severe uncontrolled pain   Complete by: As directed    Call MD for:  temperature >100.4   Complete by: As directed    Call MD for:  temperature >100.4   Complete by: As directed    Diet - low sodium heart healthy   Complete by: As directed    Diet - low sodium heart healthy   Complete by: As directed    Driving Restrictions   Complete by: As directed    No driving for 4-89 days until  the following criteria have been met.  Do not take narcotics and drive. You need to make sure your reaction time has returned.   Driving Restrictions   Complete by: As directed    No driving for 4-89 days until the following have been met: Do not take narcotics and drive. You need to make sure your reaction time has returned.   Increase activity slowly   Complete by: As directed    Increase activity slowly   Complete by: As directed    Lifting restrictions   Complete by: As directed    No lifting greater than 10 lbs, pushing,  pulling, straining for 6 weeks.   Lifting restrictions   Complete by: As directed    No lifting greater than 10 lbs, pushing, pulling, straining for 6 weeks.   Sexual Activity Restrictions   Complete by: As directed    No sexual activity, nothing in the vagina, for 12 weeks.   Sexual Activity Restrictions   Complete by: As directed    No sexual activity, nothing in the vagina, for 12 weeks.      Allergies as of 03/29/2024       Reactions   Benzonatate Shortness Of Breath   TRIGGERS ASTHMA ATTACK        Medication List     STOP taking these medications    meloxicam  15 MG tablet Commonly known as: MOBIC        TAKE these medications    acetaminophen  500 MG tablet Commonly known as: TYLENOL  Take 500-1,000 mg by mouth every 6 (six) hours as needed for moderate pain (pain score 4-6).   Airsupra  90-80 MCG/ACT Aero Generic drug: Albuterol -Budesonide  Inhale 2 puffs into the lungs as needed. Inhale 2 puffs as needed for wheezing, maximum 12 puffs/24 hours   Breztri  Aerosphere 160-9-4.8 MCG/ACT Aero inhaler Generic drug: budesonide -glycopyrrolate -formoterol  INHALE 2 PUFFS INTO THE LUNGS TWICE DAILY   carvedilol  3.125 MG tablet Commonly known as: COREG  Take 1 tablet (3.125 mg total) by mouth 2 (two) times daily with a meal.   clotrimazole -betamethasone  cream Commonly known as: LOTRISONE  APPLY TOPICALLY TO THE AFFECTED AREA TWICE DAILY   polyethylene glycol 17 g packet Commonly known as: MiraLax Take 17 g by mouth daily. For AFTER surgery, do not take if having diarrhea   traMADol  50 MG tablet Commonly known as: ULTRAM  Take 1 tablet (50 mg total) by mouth every 6 (six) hours as needed for moderate pain (pain score 4-6). For AFTER surgery only, do not take and drive   triamterene -hydrochlorothiazide 75-50 MG tablet Commonly known as: MAXZIDE TAKE 1 TABLET BY MOUTH DAILY   VITAMIN D3 PO Take 1 tablet by mouth daily.        Follow-up Information     Viktoria Comer SAUNDERS, MD Follow up on 04/04/2024.   Specialty: Gynecologic Oncology Why: Plan for a phone call visit with Dr. Viktoria on 04/04/24 to check in and discuss final pathology. The time of the call is variable since she will be in surgery on this day. IN PERSON visit will be on 04/25/24 at 8:30 am at the [facility]. Contact information: [address] [place] [address] [phone]                 Greater than thirty minutes were spend for face to face discharge instructions and discharge orders/summary in EPIC.   Signed: Eleanor JONETTA Epps 03/29/2024, 1:43 PM

## 2024-03-29 NOTE — Plan of Care (Signed)

## 2024-04-01 ENCOUNTER — Telehealth: Payer: Self-pay | Admitting: *Deleted

## 2024-04-01 LAB — CYTOLOGY - NON PAP

## 2024-04-01 NOTE — Telephone Encounter (Signed)
 Spoke with Brandy Dean this morning. She states she is eating ok but doesn't have a full appetite back yet,  she is drinking and urinating well. She has not had a BM yet but is passing gas. Pt hasn't started her miralax yet, encouraged patient to take 1 capful twice daily in water or juice and  encouraged her to drink plenty of water. She denies fever or chills. Incisions are dry and intact. She rates her pain 5/10. Her pain is controlled with tylenol .    Instructed to call office with any fever, chills, purulent drainage, uncontrolled pain or any other questions or concerns. Patient verbalizes understanding.   Pt aware of post op appointments as well as the office number [phone] and after hours number [phone] to call if she has any questions or concerns

## 2024-04-02 ENCOUNTER — Ambulatory Visit: Payer: Self-pay | Admitting: Gynecologic Oncology

## 2024-04-04 ENCOUNTER — Encounter: Payer: Self-pay | Admitting: Gynecologic Oncology

## 2024-04-04 ENCOUNTER — Inpatient Hospital Stay (HOSPITAL_BASED_OUTPATIENT_CLINIC_OR_DEPARTMENT_OTHER): Admitting: Gynecologic Oncology

## 2024-04-04 DIAGNOSIS — C541 Malignant neoplasm of endometrium: Secondary | ICD-10-CM

## 2024-04-04 DIAGNOSIS — Z90722 Acquired absence of ovaries, bilateral: Secondary | ICD-10-CM

## 2024-04-04 DIAGNOSIS — Z90710 Acquired absence of both cervix and uterus: Secondary | ICD-10-CM

## 2024-04-04 DIAGNOSIS — Z9079 Acquired absence of other genital organ(s): Secondary | ICD-10-CM

## 2024-04-04 DIAGNOSIS — Z7189 Other specified counseling: Secondary | ICD-10-CM

## 2024-04-04 NOTE — Progress Notes (Signed)
 Gynecologic Oncology Telehealth Note: Gyn-Onc  I connected with Brandy Dean on 04/04/24 at  6:00 PM EDT by telephone and verified that I am speaking with the correct person using two identifiers.  I discussed the limitations, risks, security and privacy concerns of performing an evaluation and management service by telemedicine and the availability of in-person appointments. I also discussed with the patient that there may be a patient responsible charge related to this service. The patient expressed understanding and agreed to proceed.  Other persons participating in the visit and their role in the encounter: none.  Patient's location: home, [place] Provider's location: [place]  Reason for Visit: follow-up  Treatment History: Patient presented with postmenopausal bleeding.  She describes this as light pink spotting for several months, mostly sees just when she wipes.  Denied any pelvic or abdominal pain. Vaginal biopsy was performed in clinic on 8/6.  Pathology from this showed scant fibrinous/necrotic material, and adequate for diagnosis. Pelvic ultrasound on 8/24 revealed a uterus measuring 10.3 x 4.6 x 5.4 cm.  Endometrium measures 5 cm.  Right ovary measured up to 2.3 cm.  Left ovary not visualized. 02/27/24: Hysteroscopy, D&C, MyoSure resection.  Findings include a 9 cm uterus with thickened endometrium, possible intracavitary fibroid.  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  CT C/A/P on 03/22/24: 1. Heterogeneous endometrial mass measures 5.9 x 3.1 cm, consistent with known endometrial carcinoma. 2. No evidence of metastatic disease within the chest, abdomen, or pelvis. 3. Cholelithiasis. 4. Colonic stool burden may reflect constipation. 5. Aortic atherosclerosis.  03/27/24: Robotic-assisted laparoscopic total hysterectomy with bilateral salpingoophorectomy, SLN biopsy bilaterally, cystoscopy  Interval  History: Doing well. Sore still. Bowels moving well. Denies vaginal bleeding, denies urinary symptoms.  Past Medical/Surgical History: Past Medical History:  Diagnosis Date   Bacterial pneumonia    March  9-12,  2011   Bronchial asthma    Cancer [facility])    Essential hypertension    Heart murmur    Hyperlipidemia    Morbid obesity [facility])     Past Surgical History:  Procedure Laterality Date   CESAREAN SECTION  1988 & 1991   CYSTOSCOPY  03/27/2024   Procedure: CYSTOSCOPY;  Surgeon: Viktoria Comer SAUNDERS, MD;  Location: [facility];  Service: Gynecology;;   HYSTEROSCOPY WITH D & C N/A 02/27/2024   Procedure: DILATATION AND CURETTAGE /HYSTEROSCOPY;  Surgeon: Ozan, Capri, DO;  Location: [facility];  Service: Gynecology;  Laterality: N/A;   INJECTION, FOR SENTINEL LYMPH NODE IDENTIFICATION N/A 03/27/2024   Procedure: INJECTION, FOR SENTINEL LYMPH NODE IDENTIFICATION;  Surgeon: Viktoria Comer SAUNDERS, MD;  Location: [facility];  Service: Gynecology;  Laterality: N/A;   LYMPH NODE BIOPSY N/A 03/27/2024   Procedure: LYMPH NODE BIOPSY;  Surgeon: Viktoria Comer SAUNDERS, MD;  Location: [facility];  Service: Gynecology;  Laterality: N/A;  POSSIBLE LYMPH NODE DISSECTION   RIght and left keloid ears  06/13/1966   TOTAL LAPAROSCOPIC HYSTERECTOMY WITH BILATERAL SALPINGO OOPHORECTOMY Bilateral 03/27/2024   Procedure: HYSTERECTOMY, TOTAL, LAPAROSCOPIC, WITH BILATERAL SALPINGO-OOPHORECTOMY;  Surgeon: Viktoria Comer SAUNDERS, MD;  Location: [facility];  Service: Gynecology;  Laterality: Bilateral;  POSSIBLE LAPAROTOMY    Family History  Problem Relation Age of Onset   Stroke Mother    Hypertension Mother    Esophageal cancer Father    Obesity Sister    Lung cancer Brother    Breast cancer Neg Hx    Prostate cancer Neg Hx    Endometrial cancer Neg Hx  Ovarian cancer Neg Hx    Colon cancer Neg Hx    Pancreatic cancer Neg Hx     Social History   Socioeconomic History   Marital status: Legally Separated    Spouse name:  Not on file   Number of children: 3   Years of education: Not on file   Highest education level: Not on file  Occupational History    Employer: [employer]  Tobacco Use   Smoking status: Former    Current packs/day: 0.00    Types: Cigarettes    Quit date: 06/14/2007    Years since quitting: 16.8   Smokeless tobacco: Never  Vaping Use   Vaping status: Never Used  Substance and Sexual Activity   Alcohol use: Yes    Comment: wine occ   Drug use: No   Sexual activity: Not Currently    Birth control/protection: Post-menopausal  Other Topics Concern   Not on file  Social History Narrative   Not on file   Social Drivers of Health   Financial Resource Strain: Low Risk  (08/25/2020)   Overall Financial Resource Strain (CARDIA)    Difficulty of Paying Living Expenses: Not hard at all  Food Insecurity: No Food Insecurity (03/27/2024)   Hunger Vital Sign    Worried About Running Out of Food in the Last Year: Never true    Ran Out of Food in the Last Year: Never true  Transportation Needs: No Transportation Needs (03/27/2024)   PRAPARE - Administrator, Civil Service (Medical): No    Lack of Transportation (Non-Medical): No  Physical Activity: Insufficiently Active (08/25/2020)   Exercise Vital Sign    Days of Exercise per Week: 5 days    Minutes of Exercise per Session: 20 min  Stress: No Stress Concern Present (08/25/2020)   [employer] of Occupational Health - Occupational Stress Questionnaire    Feeling of Stress : Not at all  Social Connections: Moderately Integrated (03/28/2024)   Social Connection and Isolation Panel    Frequency of Communication with Friends and Family: More than three times a week    Frequency of Social Gatherings with Friends and Family: More than three times a week    Attends Religious Services: More than 4 times per year    Active Member of [employer] or Organizations: Yes    Attends Engineer, structural: More than 4  times per year    Marital Status: Separated    Current Medications:  Current Outpatient Medications:    acetaminophen  (TYLENOL ) 500 MG tablet, Take 500-1,000 mg by mouth every 6 (six) hours as needed for moderate pain (pain score 4-6)., Disp: , Rfl:    Albuterol -Budesonide  (AIRSUPRA ) 90-80 MCG/ACT AERO, Inhale 2 puffs into the lungs as needed. Inhale 2 puffs as needed for wheezing, maximum 12 puffs/24 hours, Disp: 10.7 g, Rfl: 5   BREZTRI  AEROSPHERE 160-9-4.8 MCG/ACT AERO inhaler, INHALE 2 PUFFS INTO THE LUNGS TWICE DAILY, Disp: 10.7 g, Rfl: 11   carvedilol  (COREG ) 3.125 MG tablet, Take 1 tablet (3.125 mg total) by mouth 2 (two) times daily with a meal., Disp: 60 tablet, Rfl: 3   Cholecalciferol (VITAMIN D3 PO), Take 1 tablet by mouth daily., Disp: , Rfl:    clotrimazole -betamethasone  (LOTRISONE ) cream, APPLY TOPICALLY TO THE AFFECTED AREA TWICE DAILY, Disp: 45 g, Rfl: 0   polyethylene glycol (MIRALAX) 17 g packet, Take 17 g by mouth daily. For AFTER surgery, do not take if having diarrhea, Disp: 14 each, Rfl:  0   traMADol  (ULTRAM ) 50 MG tablet, Take 1 tablet (50 mg total) by mouth every 6 (six) hours as needed for moderate pain (pain score 4-6). For AFTER surgery only, do not take and drive, Disp: 10 tablet, Rfl: 0   triamterene -hydrochlorothiazide (MAXZIDE) 75-50 MG tablet, TAKE 1 TABLET BY MOUTH DAILY, Disp: 90 tablet, Rfl: 1  Review of Symptoms: Pertinent positives as per HPI.  Physical Exam: Deferred given limitations of phone visit.  Laboratory & Radiologic Studies: A. UTERUS, CERVIX, BILATERAL FALLOPIAN TUBES AND OVARIES, HYSTERECTOMY:       Endometrioid adenocarcinoma, FIGO grade 3.       Carcinoma invades 50% of endometrium (5 mm / 10 mm).       No lymphovascular invasion identified.       Benign leiomyomata.       Benign bilateral fimbriated fallopian tubes.       Benign bilateral ovaries.       See oncology table.   B. SENTINEL LYMPH NODE, RIGHT OBTURATOR, EXCISION:        One lymph node, negative for metastatic carcinoma (0/1).   C. SENTINEL LYMPH NODE, LEFT OBTURATOR, EXCISION:       One lymph node, negative for metastatic carcinoma (0/1).   FINAL MICROSCOPIC DIAGNOSIS:  - No malignant cells identified  - Reactive mesothelial cells present   Assessment & Plan: Brandy Dean is a 73 y.o. 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 50% MI, no LVI.  MMRp. P53 mutated.  Doing well. Meeting milestones. Reviewed pathology report. Discussed sending somatic testing to help guide adjuvant treatment decisions.  I discussed the assessment and treatment plan with the patient. The patient was provided with an opportunity to ask questions and all were answered. The patient agreed with the plan and demonstrated an understanding of the instructions.   The patient was advised to call back or see an in-person evaluation if the symptoms worsen or if the condition fails to improve as anticipated.   8 minutes of total time was spent for this patient encounter, including preparation, phone counseling with the patient and coordination of care, and documentation of the encounter.   Comer Dollar, MD  Division of Gynecologic Oncology  Department of Obstetrics and Gynecology  [facility]

## 2024-04-05 ENCOUNTER — Encounter: Payer: Self-pay | Admitting: Oncology

## 2024-04-05 NOTE — Progress Notes (Signed)
 Order requisition sent to [facility] on accession [id number] per Dr. Viktoria.

## 2024-04-08 ENCOUNTER — Other Ambulatory Visit: Payer: Self-pay | Admitting: Oncology

## 2024-04-08 ENCOUNTER — Other Ambulatory Visit: Payer: Self-pay | Admitting: Hematology and Oncology

## 2024-04-08 ENCOUNTER — Telehealth: Payer: Self-pay | Admitting: Oncology

## 2024-04-08 ENCOUNTER — Telehealth: Payer: Self-pay | Admitting: *Deleted

## 2024-04-08 ENCOUNTER — Ambulatory Visit: Payer: Self-pay

## 2024-04-08 DIAGNOSIS — C541 Malignant neoplasm of endometrium: Secondary | ICD-10-CM

## 2024-04-08 NOTE — Progress Notes (Signed)
 err

## 2024-04-08 NOTE — Telephone Encounter (Signed)
 Called [facility] and discussed the Gyn Oncology Cancer Conference recommendations for chemotherapy and vaginal brachytherapy.  Asked if she would like to get chemotherapy at [facility] or at the [facility].  She would like to have treatment at the [facility].  Let her know that I will be arranging appointments and will call her back.

## 2024-04-08 NOTE — Telephone Encounter (Signed)
 FYI Only or Action Required?: FYI only for provider.  Patient was last seen in primary care on 03/21/2024 by Brandy Suzzane POUR, MD.  Called Nurse Triage reporting Foot Swelling.  Symptoms began yesterday.  Interventions attempted: Rest, hydration, or home remedies.  Symptoms are: gradually worsening.  Triage Disposition: See Physician Within 24 Hours  Patient/caregiver understands and will follow disposition?: Yes Copied from CRM #[id number]. Topic: Clinical - Medical Advice >> Apr 08, 2024  4:47 PM Wess RAMAN wrote: Reason for CRM: Patient was told to contact Dr. [name] by Dr. Viktoria, Comer from [facility] to see if she should restart her carvedilol  (COREG ) 3.125 MG tablet  Callback #: [phone] Reason for Disposition  [1] MODERATE leg swelling (e.g., swelling extends up to knees) AND [2] new-onset or getting worse  Answer Assessment - Initial Assessment Questions Patient called to find out if she should restart carvedolol  1. ONSET: When did the swelling start? (e.g., minutes, hours, days)     This week 2. LOCATION: What part of the leg is swollen?  Are both legs swollen or just one leg?     Both feet and ankles up to just below knee 3. SEVERITY: How bad is the swelling? (e.g., localized; mild, moderate, severe)     mild 4. REDNESS: Is there redness or signs of infection?     denies 5. PAIN: Is the swelling painful to touch? If Yes, ask: How painful is it?   (Scale 1-10; mild, moderate or severe)     denies 6. FEVER: Do you have a fever? If Yes, ask: What is it, how was it measured, and when did it start?      denies 7. CAUSE: What do you think is causing the leg swelling?     unsure 8. MEDICAL HISTORY: Do you have a history of blood clots (e.g., DVT), cancer, heart failure, kidney disease, or liver failure?     Currently treated for uterine cancer 9. RECURRENT SYMPTOM: Have you had leg swelling before? If Yes, ask: When was the  last time? What happened that time?     Minor swelling int the past, but never lasts this long 10. OTHER SYMPTOMS: Do you have any other symptoms? (e.g., chest pain, difficulty breathing)       denies  Protocols used: Leg Swelling and Edema-A-AH

## 2024-04-08 NOTE — Telephone Encounter (Signed)
 Spoke with Brandy Dean who called the office stating she is having leg and feet swelling. Pt states the swelling comes and goes.   Pt denies headache, chest pain, dizziness, pain and no fever or chills. Pt also denies vaginal bleeding. Reports having regular bowel movements and she is emptying her bladder often and several times a day. Pt is drinking lots of water at least 3 12oz bottles of water daily with other liquids. Pt is ambulating throughout the day and has elevated her legs at times when she notices the swelling.   Pt also denies numbness and tingling in her feet and legs. Pt is taking her Maxzide as prescribed. Pt is currently not taking her Coreg  and advised patient to call her PCP to ask when she should start taking this again?  Advised patient her message will be relayed to providers and the office will call back with any recommendations.

## 2024-04-08 NOTE — Telephone Encounter (Signed)
 Called Brandy Dean back and scheduled an appointment with Dr. Lonn on 04/18/24 at 2:00 with arrival to check in to the cancer center at 1:30.  Discussed that she will also have a patient education class afterwards at 4:00.  Also briefly discussed port a cath placement and advised her that IR will be calling her to schedule an appointment.

## 2024-04-08 NOTE — Progress Notes (Addendum)
 Gynecologic Oncology Multi-Disciplinary Disposition Conference Note  Date of the Conference: 04/08/2024  Patient Name: Brandy Dean  Referring Provider: Dr. Marilynn Primary GYN Oncologist: Dr. Viktoria   Stage/Disposition:  Stage IIC, grade 3 endometrioid endometrial adenocarcinoma. Disposition is to adjuvant chemotherapy and vaginal brachytherapy.   This Multidisciplinary conference took place involving physicians from Gynecologic Oncology, Medical Oncology, Radiation Oncology, Pathology, Radiology along with the Gynecologic Oncology Nurse Practitioner and Gynecologic Oncology Nurse Navigator.  Comprehensive assessment of the patient's malignancy, staging, need for surgery, chemotherapy, radiation therapy, and need for further testing were reviewed. Supportive measures, both inpatient and following discharge were also discussed. The recommended plan of care is documented. Greater than 35 minutes were spent correlating and coordinating this patient's care.

## 2024-04-09 ENCOUNTER — Telehealth: Payer: Self-pay | Admitting: Family Medicine

## 2024-04-09 ENCOUNTER — Ambulatory Visit: Payer: Self-pay | Admitting: Nurse Practitioner

## 2024-04-09 ENCOUNTER — Other Ambulatory Visit: Payer: Self-pay | Admitting: Nurse Practitioner

## 2024-04-09 ENCOUNTER — Encounter: Payer: Self-pay | Admitting: Nurse Practitioner

## 2024-04-09 VITALS — BP 128/81 | HR 69 | Temp 97.2°F | Ht 64.0 in | Wt 248.0 lb

## 2024-04-09 DIAGNOSIS — R600 Localized edema: Secondary | ICD-10-CM | POA: Diagnosis not present

## 2024-04-09 DIAGNOSIS — R0602 Shortness of breath: Secondary | ICD-10-CM | POA: Diagnosis not present

## 2024-04-09 MED ORDER — POTASSIUM CHLORIDE ER 10 MEQ PO TBCR: EXTENDED_RELEASE_TABLET | ORAL | 0 refills | Status: DC

## 2024-04-09 MED ORDER — FUROSEMIDE 20 MG PO TABS: 20.0000 mg | ORAL_TABLET | Freq: Every day | ORAL | 0 refills | Status: DC

## 2024-04-09 NOTE — Telephone Encounter (Signed)
Noted, patient scheduled.

## 2024-04-09 NOTE — Patient Instructions (Addendum)
 Restart Carvedilol  Hold on Maxzide (hydrochlorothiazide/triamterene ) Start Furosemide and take potassium whenever you take this. Watch BP and weight.  Call the office Friday to report weight and BP.

## 2024-04-09 NOTE — Telephone Encounter (Signed)
 Spoke with Brandy Dean who states the swelling in her legs and feet bilaterally are equal. Pt denies any pain or redness in her calves. Pt states she is currently at her PCP's office now and they will be drawing a Basic Metabolic Panel. Advised patient her message will be relayed to providers.

## 2024-04-09 NOTE — Telephone Encounter (Signed)
 Copied from CRM [id number]. Topic: Clinical - Prescription Issue >> Apr 09, 2024  4:23 PM Shanda MATSU wrote: Reason for CRM: Patient was given a prescription for a bp machine, she stated that her [facility] pharmacy told her they do not fill these type of prescriptions, patient is wanting to know where she can get the [prescription filled using her insurance, patient is req a call back in regards to this.

## 2024-04-09 NOTE — Progress Notes (Signed)
 Subjective:    Patient ID: Brandy Dean, female    DOB: 11-22-1950, 73 y.o.   MRN: [id number]  HPI Foot ankle and knee swelling Rapid weight gain , has not yet been advised on restarting carvedilol  after uterine ca surgery Discussed the use of AI scribe software for clinical note transcription with the patient, who gave verbal consent to proceed.  History of Present Illness Brandy Dean is a 73 year old female who presents with bilateral lower extremity swelling following a recent hysterectomy.  She developed bilateral lower extremity swelling over the weekend after returning home from the hospital. The swelling affects both feet and ankles. She has been elevating her legs to reduce the swelling.  She underwent a total hysterectomy recently and did not experience any swelling while hospitalized. She is currently taking Maxide once daily as a diuretic and has taken her dose this morning. She also mentions taking carvedilol , which was started in the spring or summer, but has not been restarted post-surgery. She reports no history of heart failure or diabetes.  She experiences increased fatigue and shortness of breath when walking, which she attributes to post-surgical recovery. No significant changes in her breathing at night, such as needing to sleep sitting up or using more pillows, but she reports a cough that was alleviated with an over-the-counter remedy. She also notes feeling more tired than usual when walking.  Her past medical history includes hypertension, for which she takes medication. She denies any excessive sodium intake recently, although she recalls consuming some soup but is unsure of the timing relative to her hospital discharge. No difficulty voiding.      Review of Systems  Constitutional:  Positive for fatigue. Negative for fever.  Respiratory:  Positive for cough and shortness of breath. Negative for chest tightness and wheezing.   Cardiovascular:  Positive for  leg swelling. Negative for chest pain.   Social History   Tobacco Use   Smoking status: Former    Current packs/day: 0.00    Types: Cigarettes    Quit date: 06/14/2007    Years since quitting: 16.8   Smokeless tobacco: Never  Vaping Use   Vaping status: Never Used  Substance Use Topics   Alcohol use: Yes    Comment: wine occ   Drug use: No   Past Medical History:  Diagnosis Date   Bacterial pneumonia    March  9-12,  2011   Bronchial asthma    Cancer [facility])    Essential hypertension    Heart murmur    Hyperlipidemia    Morbid obesity (HCC)         Objective:   Physical Exam Vitals and nursing note reviewed.  Constitutional:      General: She is not in acute distress. Neck:     Vascular: No carotid bruit.     Comments: No carotid bruits or thrills. No JVD.  Cardiovascular:     Rate and Rhythm: Normal rate and regular rhythm.     Heart sounds: Murmur heard.     No gallop.     Comments: Soft, holosystolic murmur II/VI.  Pulmonary:     Effort: Pulmonary effort is normal. No respiratory distress.     Breath sounds: Normal breath sounds.  Musculoskeletal:     Right lower leg: Edema present.     Left lower leg: Edema present.     Comments: 3+ pitting edema ankles and feet bilaterally.   Skin:    General: Skin is  warm and dry.     Findings: No erythema.     Comments: Normal DP pulses with normal cap refill both feet.   Neurological:     Mental Status: She is alert and oriented to person, place, and time.  Psychiatric:        Mood and Affect: Mood normal.        Behavior: Behavior normal.        Thought Content: Thought content normal.    Today's Vitals   04/09/24 1021  BP: 128/81  Pulse: 69  Temp: (!) 97.2 F (36.2 C)  SpO2: 98%  Weight: 248 lb (112.5 kg)  Height: 5' 4 (1.626 m)   Body mass index is 42.57 kg/m.  10 lb weight gain since 10/15.      Assessment & Plan:  1. Peripheral edema (Primary) Acute edema likely due to dependent swelling  post-surgery. No DVT signs. Normal kidney function. BNP test ordered to rule out cardiac causes. - Order BNP test. - Prescribe stronger diuretic. - Advise monitoring sodium intake and leg elevation. - Instruct to report worsening symptoms. - Brain natriuretic peptide - furosemide (LASIX) 20 MG tablet; Take 1 tablet (20 mg total) by mouth daily. Prn swelling  Dispense: 30 tablet; Refill: 0 - potassium chloride (KLOR-CON) 10 MEQ tablet; Take one tab daily when you take Furosemide fluid pill  Dispense: 30 tablet; Refill: 0  2. Shortness of breath on exertion  - Brain natriuretic peptide - furosemide (LASIX) 20 MG tablet; Take 1 tablet (20 mg total) by mouth daily. Prn swelling  Dispense: 30 tablet; Refill: 0 - potassium chloride (KLOR-CON) 10 MEQ tablet; Take one tab daily when you take Furosemide fluid pill  Dispense: 30 tablet; Refill: 0  Restart Carvedilol  Hold on Maxzide (hydrochlorothiazide/triamterene ) Start Furosemide and take potassium whenever you take this. Watch BP and weight.  Call the office Friday to report weight and BP.

## 2024-04-10 ENCOUNTER — Encounter: Payer: Self-pay | Admitting: Nurse Practitioner

## 2024-04-10 DIAGNOSIS — R600 Localized edema: Secondary | ICD-10-CM | POA: Insufficient documentation

## 2024-04-10 NOTE — Telephone Encounter (Signed)
 Spoke to patient

## 2024-04-11 ENCOUNTER — Ambulatory Visit: Payer: Self-pay | Admitting: Nurse Practitioner

## 2024-04-11 LAB — BRAIN NATRIURETIC PEPTIDE: BNP: 144.9 pg/mL — ABNORMAL HIGH (ref 0.0–100.0)

## 2024-04-12 ENCOUNTER — Telehealth: Payer: Self-pay

## 2024-04-12 NOTE — Telephone Encounter (Signed)
 Copied from CRM [id number]. Topic: Clinical - Medical Advice >> Apr 12, 2024  1:35 PM Nathanel BROCKS wrote: Reason for CRM: Bp is140/82, weight is 239. Elveria Quarry wanted this information.

## 2024-04-15 ENCOUNTER — Ambulatory Visit: Payer: Self-pay

## 2024-04-15 NOTE — Telephone Encounter (Signed)
 FYI Only or Action Required?: FYI only for provider: appointment scheduled on 11/5.  Patient was last seen in primary care on 04/09/2024 by Brandy Elveria BROCKS, NP.  Called Nurse Triage reporting Foot Swelling.  Symptoms began a week ago.  Interventions attempted: Prescription medications: Furosamide.  Symptoms are: unchanged.  Triage Disposition: See Physician Within 24 Hours  Patient/caregiver understands and will follow disposition?: Yes       Copied from CRM [id number]. Topic: Clinical - Red Word Triage >> Apr 15, 2024  9:35 AM Brandy Dean wrote: [facility] that prompted transfer to Nurse Triage: Swelling in feet, meds aren't working. Left foot is sore on top. Reason for Disposition  [1] MODERATE leg swelling (e.g., swelling extends up to knees) AND [2] new-onset or getting worse  Answer Assessment - Initial Assessment Questions 1. ONSET: When did the swelling start? (e.g., minutes, hours, days)      X 1 week, currently taking Furosamide, swelling persists   2. LOCATION: What part of the leg is swollen?  Are both legs swollen or just one leg?     BIL feet, left foot is sore on the top of foot   3. SEVERITY: How bad is the swelling? (e.g., localized; mild, moderate, severe)     5/10  4. REDNESS: Is there redness or signs of infection?     No   5. PAIN: Is the swelling painful to touch? If Yes, ask: How painful is it?   (Scale 1-10; mild, moderate or severe)      5/10 tightness   6. FEVER: Do you have a fever? If Yes, ask: What is it, how was it measured, and when did it start?      No   7. CAUSE: What do you think is causing the leg swelling?      Possible surgery the prior week   8. MEDICAL HISTORY: Do you have a history of blood clots (e.g., DVT), cancer, heart failure, kidney disease, or liver failure?     No   9. RECURRENT SYMPTOM: Have you had leg swelling before? If Yes, ask: When was the last time? What happened that time?     No    10. OTHER SYMPTOMS: Do you have any other symptoms? (e.g., chest pain, difficulty breathing) No    Patient was seen in office on 04/10/24, and was prescribed Furosamide, the swelling persists. She is keeping BIL feet elevated. Appt. Scheduled.  Protocols used: Leg Swelling and Edema-A-AH

## 2024-04-15 NOTE — Telephone Encounter (Signed)
Noted patient scheduled

## 2024-04-15 NOTE — Progress Notes (Signed)
 Pt called in to report that sx are unchanged. Pt has f/u with pcp on 11/05.

## 2024-04-16 ENCOUNTER — Encounter: Payer: Self-pay | Admitting: Gynecologic Oncology

## 2024-04-17 ENCOUNTER — Ambulatory Visit: Payer: Self-pay | Admitting: Family Medicine

## 2024-04-17 ENCOUNTER — Encounter: Payer: Self-pay | Admitting: Family Medicine

## 2024-04-17 ENCOUNTER — Telehealth: Payer: Self-pay

## 2024-04-17 VITALS — BP 155/85 | HR 56 | Resp 18 | Ht 64.0 in | Wt 249.0 lb

## 2024-04-17 DIAGNOSIS — Z23 Encounter for immunization: Secondary | ICD-10-CM | POA: Diagnosis not present

## 2024-04-17 DIAGNOSIS — C541 Malignant neoplasm of endometrium: Secondary | ICD-10-CM

## 2024-04-17 DIAGNOSIS — I10 Essential (primary) hypertension: Secondary | ICD-10-CM | POA: Diagnosis not present

## 2024-04-17 DIAGNOSIS — E66813 Obesity, class 3: Secondary | ICD-10-CM | POA: Diagnosis not present

## 2024-04-17 DIAGNOSIS — J45991 Cough variant asthma: Secondary | ICD-10-CM

## 2024-04-17 MED ORDER — TRIAMTERENE-HCTZ 75-50 MG PO TABS: 1.0000 | ORAL_TABLET | Freq: Every day | ORAL | 3 refills | Status: DC

## 2024-04-17 NOTE — Telephone Encounter (Signed)
 Copied from CRM #[id number]. Topic: General - Call Back - No Documentation >> Apr 17, 2024  3:08 PM Joesph B wrote: Reason for CRM: Patient states she has a message in my chart and can not access it. She prefers phone calls, not my chart. [phone].

## 2024-04-17 NOTE — Telephone Encounter (Signed)
 I dont see where a message was sent

## 2024-04-17 NOTE — Patient Instructions (Addendum)
 F/u in Feb as before  Nurse BP check in 2 weeks  ONLY Take triamterene  75/50 as you had been taking in the past , ONE daily for blood pressure  STOP carvedilol , furosemide  and potassium  Flu vaccine today, GOOD decision  Thankful surgery successful  Thanks for choosing [facility], we consider it a privelige to serve you.

## 2024-04-17 NOTE — Progress Notes (Signed)
 Radiation Oncology         [phone] ________________________________  Initial Outpatient Consultation  Name: Brandy Dean MRN: [id number]  Date: 04/18/2024  DOB: 1950/09/27  RR:[id number], Rollene BRAVO, MD  Viktoria Comer SAUNDERS, MD   REFERRING PHYSICIAN: Viktoria Comer SAUNDERS, MD  DIAGNOSIS: There were no encounter diagnoses.  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 female who is accompanied by ***. she is seen as a courtesy of Dr. Viktoria for an opinion concerning radiation therapy as part of management for her recently diagnosed endometrial cancer.  The patient presented to her [facility] in early August with complains of postmenopausal bleeding that has persisted for the prior few months. To further investigate her symptoms, she underwent a vaginal biopsy on 01/17/24 with results showing scant fibrinous/necrotic material containing histiocytes/macrophages,  inadequate for diagnosis.    Pelvic ultrasound on 8/24 revealed a uterus measuring 10.3 x 4.6 x 5.4 cm with endometrium measuring 5 cm. Right ovary measured up to 2.3 cm while the left ovary was not visualized.   Subsequently, she underwent a hysteroscopy on 02/27/24 under the care of Dr. Ozan. Surgical pathology of endometrial curetting indicating high-grade endometrial adenocarcinoma consistent with p53-mutated  endometrioid carcinoma, FIGO grade 3     In light of findings, she was referred to Dr. Viktoria on 03/15/24 to discuss further treatment options. They opted to proceed with a total laparoscopic hysterectomy with bilateral salpingo oophorectomy which she underwent on 03/27/24. Surgical pathology showing endometrioid adenocarcinoma, FIGO grade 3 with carcinoma invading 50% of endometrium. Examined lymph node is negative for malignancy.      Most recent CT CAP performed on 03/25/24 redemonstrating heterogeneous endometrial mass measuring 5.9 x 3.1 cm with no  evidence of  metastatic disease within the chest, abdomen, or pelvis.     Case was presented to the tumor board on 04/08/24 with disposition is to adjuvant chemotherapy and vaginal brachytherapy.   PREVIOUS RADIATION THERAPY: {EXAM; YES/NO:19492::No}  PAST MEDICAL HISTORY:  Past Medical History:  Diagnosis Date   Bacterial pneumonia    March  9-12,  2011   Bronchial asthma    Cancer [facility])    Essential hypertension    Heart murmur    Hyperlipidemia    Morbid obesity (HCC)     PAST SURGICAL HISTORY: Past Surgical History:  Procedure Laterality Date   CESAREAN SECTION  1988 & 1991   CYSTOSCOPY  03/27/2024   Procedure: CYSTOSCOPY;  Surgeon: Viktoria Comer SAUNDERS, MD;  Location: [facility];  Service: Gynecology;;   HYSTEROSCOPY WITH D & C N/A 02/27/2024   Procedure: DILATATION AND CURETTAGE /HYSTEROSCOPY;  Surgeon: Ozan, Temika, DO;  Location: [facility];  Service: Gynecology;  Laterality: N/A;   INJECTION, FOR SENTINEL LYMPH NODE IDENTIFICATION N/A 03/27/2024   Procedure: INJECTION, FOR SENTINEL LYMPH NODE IDENTIFICATION;  Surgeon: Viktoria Comer SAUNDERS, MD;  Location: [facility];  Service: Gynecology;  Laterality: N/A;   LYMPH NODE BIOPSY N/A 03/27/2024   Procedure: LYMPH NODE BIOPSY;  Surgeon: Viktoria Comer SAUNDERS, MD;  Location: [facility];  Service: Gynecology;  Laterality: N/A;  POSSIBLE LYMPH NODE DISSECTION   RIght and left keloid ears  06/13/1966   TOTAL LAPAROSCOPIC HYSTERECTOMY WITH BILATERAL SALPINGO OOPHORECTOMY Bilateral 03/27/2024   Procedure: HYSTERECTOMY, TOTAL, LAPAROSCOPIC, WITH BILATERAL SALPINGO-OOPHORECTOMY;  Surgeon: Viktoria Comer SAUNDERS, MD;  Location: [facility];  Service: Gynecology;  Laterality: Bilateral;  POSSIBLE LAPAROTOMY    FAMILY HISTORY:  Family History  Problem  Relation Age of Onset   Stroke Mother    Hypertension Mother    Esophageal cancer Father    Obesity Sister    Lung cancer Brother    Breast cancer Neg Hx    Prostate cancer Neg Hx    Endometrial cancer Neg  Hx    Ovarian cancer Neg Hx    Colon cancer Neg Hx    Pancreatic cancer Neg Hx     SOCIAL HISTORY:  Social History   Tobacco Use   Smoking status: Former    Current packs/day: 0.00    Types: Cigarettes    Quit date: 06/14/2007    Years since quitting: 16.8   Smokeless tobacco: Never  Vaping Use   Vaping status: Never Used  Substance Use Topics   Alcohol use: Yes    Comment: wine occ   Drug use: No    ALLERGIES:  Allergies  Allergen Reactions   Benzonatate Shortness Of Breath    TRIGGERS ASTHMA ATTACK    MEDICATIONS:  Current Outpatient Medications  Medication Sig Dispense Refill   acetaminophen  (TYLENOL ) 500 MG tablet Take 500-1,000 mg by mouth every 6 (six) hours as needed for moderate pain (pain score 4-6).     Albuterol -Budesonide  (AIRSUPRA ) 90-80 MCG/ACT AERO Inhale 2 puffs into the lungs as needed. Inhale 2 puffs as needed for wheezing, maximum 12 puffs/24 hours 10.7 g 5   BREZTRI  AEROSPHERE 160-9-4.8 MCG/ACT AERO inhaler INHALE 2 PUFFS INTO THE LUNGS TWICE DAILY 10.7 g 11   carvedilol  (COREG ) 3.125 MG tablet Take 1 tablet (3.125 mg total) by mouth 2 (two) times daily with a meal. 60 tablet 3   Cholecalciferol (VITAMIN D3 PO) Take 1 tablet by mouth daily.     clotrimazole -betamethasone  (LOTRISONE ) cream APPLY TOPICALLY TO THE AFFECTED AREA TWICE DAILY 45 g 0   furosemide (LASIX) 20 MG tablet Take 1 tablet (20 mg total) by mouth daily. Prn swelling 30 tablet 0   polyethylene glycol (MIRALAX) 17 g packet Take 17 g by mouth daily. For AFTER surgery, do not take if having diarrhea 14 each 0   potassium chloride (KLOR-CON) 10 MEQ tablet Take one tab daily when you take Furosemide fluid pill 30 tablet 0   traMADol  (ULTRAM ) 50 MG tablet Take 1 tablet (50 mg total) by mouth every 6 (six) hours as needed for moderate pain (pain score 4-6). For AFTER surgery only, do not take and drive 10 tablet 0   No current facility-administered medications for this visit.    REVIEW OF  SYSTEMS:  A 10+ POINT REVIEW OF SYSTEMS WAS OBTAINED including neurology, dermatology, psychiatry, cardiac, respiratory, lymph, extremities, GI, GU, musculoskeletal, constitutional, reproductive, HEENT. ***   PHYSICAL EXAM:  vitals were not taken for this visit.   General: Alert and oriented, in no acute distress HEENT: Head is normocephalic. Extraocular movements are intact. Oropharynx is clear. Neck: Neck is supple, no palpable cervical or supraclavicular lymphadenopathy. Heart: Regular in rate and rhythm with no murmurs, rubs, or gallops. Chest: Clear to auscultation bilaterally, with no rhonchi, wheezes, or rales. Abdomen: Soft, nontender, nondistended, with no rigidity or guarding. Extremities: No cyanosis or edema. Lymphatics: see Neck Exam Skin: No concerning lesions. Musculoskeletal: symmetric strength and muscle tone throughout. Neurologic: Cranial nerves II through XII are grossly intact. No obvious focalities. Speech is fluent. Coordination is intact. Psychiatric: Judgment and insight are intact. Affect is appropriate. ***  ECOG = ***  0 - Asymptomatic (Fully active, able to carry on all predisease activities without  restriction)  1 - Symptomatic but completely ambulatory (Restricted in physically strenuous activity but ambulatory and able to carry out work of a light or sedentary nature. For example, light housework, office work)  2 - Symptomatic, <50% in bed during the day (Ambulatory and capable of all self care but unable to carry out any work activities. Up and about more than 50% of waking hours)  3 - Symptomatic, >50% in bed, but not bedbound (Capable of only limited self-care, confined to bed or chair 50% or more of waking hours)  4 - Bedbound (Completely disabled. Cannot carry on any self-care. Totally confined to bed or chair)  5 - Death   Raylene MM, Creech RH, Tormey DC, et al. [phone]). Toxicity and response criteria of the [facility]. Am.  DOROTHA Bridges. Oncol. 5 (6): 649-55  LABORATORY DATA:  Lab Results  Component Value Date   WBC 11.4 (H) 03/28/2024   HGB 12.1 03/28/2024   HCT 38.6 03/28/2024   MCV 86.9 03/28/2024   PLT 194 03/28/2024   NEUTROABS 4.0 02/16/2024   Lab Results  Component Value Date   NA 136 03/28/2024   K 4.8 03/28/2024   CL 101 03/28/2024   CO2 29 03/28/2024   GLUCOSE 100 (H) 03/28/2024   BUN 11 03/28/2024   CREATININE 0.63 03/28/2024   CALCIUM  9.9 03/28/2024      RADIOGRAPHY: CT CHEST ABDOMEN PELVIS W CONTRAST Result Date: 03/25/2024 CLINICAL DATA:  Genital cancer staging, * Tracking Code: BO *. EXAM: CT CHEST, ABDOMEN, AND PELVIS WITH CONTRAST TECHNIQUE: Multidetector CT imaging of the chest, abdomen and pelvis was performed following the standard protocol during bolus administration of intravenous contrast. RADIATION DOSE REDUCTION: This exam was performed according to the departmental dose-optimization program which includes automated exposure control, adjustment of the mA and/or kV according to patient size and/or use of iterative reconstruction technique. CONTRAST:  OMNIPAQUE  IOHEXOL  300 MG/ML  SOLN COMPARISON:  Pelvic ultrasound January 23, 2024. FINDINGS: CT CHEST FINDINGS Cardiovascular: Aortic atherosclerosis. Coronary artery calcifications. Normal size heart. No significant pericardial effusion/thickening. Mediastinum/Nodes: No suspicious thyroid nodule. No pathologically enlarged mediastinal, hilar or axillary lymph nodes. Esophagus is grossly unremarkable. Lungs/Pleura: No suspicious pulmonary nodules or masses. Scattered atelectasis/scarring. No pleural effusion. No pneumothorax. Musculoskeletal: No aggressive lytic or blastic lesion of bone. Thoracic spondylosis. Degenerative change of the bilateral shoulders with a left shoulder joint effusion and bursal fluid. CT ABDOMEN PELVIS FINDINGS Hepatobiliary: No suspicious hepatic lesion. Cholelithiasis. No biliary ductal dilation. Pancreas: No  pancreatic ductal dilation or evidence of acute inflammation. Spleen: No splenomegaly. Adrenals/Urinary Tract: No suspicious adrenal nodule/mass. No hydronephrosis. Kidneys demonstrate symmetric enhancement. Urinary bladder is unremarkable for degree of distension. Stomach/Bowel: Radiopaque enteric contrast material traverses distal loops of small bowel. Colonic stool burden may reflect constipation. No evidence of bowel obstruction. Vascular/Lymphatic: Aortic atherosclerosis. No pathologically enlarged abdominal or pelvic lymph nodes. Reproductive: Heterogeneous endometrial mass measures 5.9 x 3.1 cm on image 87/2. Other: No significant abdominopelvic free fluid. Musculoskeletal: No aggressive lytic or blastic lesion of bone. Lumbar spondylosis. IMPRESSION: 1. Heterogeneous endometrial mass measures 5.9 x 3.1 cm, consistent with known endometrial carcinoma. 2. No evidence of metastatic disease within the chest, abdomen, or pelvis. 3. Cholelithiasis. 4. Colonic stool burden may reflect constipation. 5. Aortic atherosclerosis. Aortic Atherosclerosis (ICD10-I70.0). Electronically Signed   By: Reyes Holder M.D.   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  We discussed the natural history of *** and general treatment, highlighting the role of radiotherapy in the management.  We discussed the available radiation techniques, and focused on the details of logistics and delivery.  We reviewed the anticipated acute and late sequelae associated with radiation in this setting.  The patient was encouraged to ask questions that I answered to the best of my ability. *** A patient consent form was discussed and signed.  We retained a copy for our records.  The patient would like to proceed with radiation and will be scheduled for CT simulation.  PLAN: ***    *** minutes of  total time was spent for this patient encounter, including preparation, face-to-face counseling with the patient and coordination of care, physical exam, and documentation of the encounter.   ------------------------------------------------  Lynwood CHARM Nasuti, PhD, MD  This document serves as a record of services personally performed by Lynwood Nasuti, MD. It was created on his behalf by Reymundo Cartwright, a trained medical scribe. The creation of this record is based on the scribe's personal observations and the provider's statements to them. This document has been checked and approved by the attending provider.

## 2024-04-17 NOTE — Progress Notes (Signed)
 GYN Location of Tumor / Histology: {Blank single:19197::Endometrial,Cervical,Uterine}  Delon GORMAN Novak presented with symptoms of: {symptoms; gyn cyclic:13153::bleeding}  Biopsies revealed:    Past/Anticipated interventions by Gyn/Onc surgery, if any:    Past/Anticipated interventions by medical oncology, if any: ***  Weight changes, if any: {:18581}  Bowel/Bladder complaints, if any: {yes no:314532}, {Blank single:19197::diarrhea,constipation,urinary frequency,burning,trouble emptying bladder, }  Nausea/Vomiting, if any: {:18581}  Pain issues, if any:  {:18581}  SAFETY ISSUES: Prior radiation? {:18581} Pacemaker/ICD? {:18581} Possible current pregnancy? {:18581} Is the patient on methotrexate? {:18581}  Current Complaints / other details:  ***

## 2024-04-18 ENCOUNTER — Ambulatory Visit
Admission: RE | Admit: 2024-04-18 | Discharge: 2024-04-18 | Disposition: A | Discharge status: Home / Self Care | Source: Ambulatory Visit | Attending: Radiation Oncology | Admitting: Radiation Oncology

## 2024-04-18 ENCOUNTER — Encounter: Payer: Self-pay | Admitting: Oncology

## 2024-04-18 ENCOUNTER — Encounter: Payer: Self-pay | Admitting: Radiation Oncology

## 2024-04-18 ENCOUNTER — Inpatient Hospital Stay

## 2024-04-18 ENCOUNTER — Inpatient Hospital Stay: Attending: Gynecologic Oncology | Admitting: Hematology and Oncology

## 2024-04-18 ENCOUNTER — Encounter: Payer: Self-pay | Admitting: Hematology and Oncology

## 2024-04-18 VITALS — BP 141/59 | HR 59 | Temp 99.6°F | Resp 18 | Ht 64.0 in | Wt 248.6 lb

## 2024-04-18 DIAGNOSIS — Z5111 Encounter for antineoplastic chemotherapy: Secondary | ICD-10-CM | POA: Insufficient documentation

## 2024-04-18 DIAGNOSIS — I10 Essential (primary) hypertension: Secondary | ICD-10-CM | POA: Insufficient documentation

## 2024-04-18 DIAGNOSIS — I2510 Atherosclerotic heart disease of native coronary artery without angina pectoris: Secondary | ICD-10-CM | POA: Diagnosis not present

## 2024-04-18 DIAGNOSIS — Z90710 Acquired absence of both cervix and uterus: Secondary | ICD-10-CM | POA: Insufficient documentation

## 2024-04-18 DIAGNOSIS — M19011 Primary osteoarthritis, right shoulder: Secondary | ICD-10-CM | POA: Insufficient documentation

## 2024-04-18 DIAGNOSIS — C541 Malignant neoplasm of endometrium: Secondary | ICD-10-CM

## 2024-04-18 DIAGNOSIS — Z79899 Other long term (current) drug therapy: Secondary | ICD-10-CM | POA: Insufficient documentation

## 2024-04-18 DIAGNOSIS — Z79630 Long term (current) use of alkylating agent: Secondary | ICD-10-CM | POA: Insufficient documentation

## 2024-04-18 DIAGNOSIS — M25412 Effusion, left shoulder: Secondary | ICD-10-CM | POA: Insufficient documentation

## 2024-04-18 DIAGNOSIS — Z87891 Personal history of nicotine dependence: Secondary | ICD-10-CM | POA: Insufficient documentation

## 2024-04-18 DIAGNOSIS — Z6841 Body Mass Index (BMI) 40.0 and over, adult: Secondary | ICD-10-CM | POA: Insufficient documentation

## 2024-04-18 DIAGNOSIS — Z808 Family history of malignant neoplasm of other organs or systems: Secondary | ICD-10-CM | POA: Insufficient documentation

## 2024-04-18 DIAGNOSIS — M47816 Spondylosis without myelopathy or radiculopathy, lumbar region: Secondary | ICD-10-CM | POA: Insufficient documentation

## 2024-04-18 DIAGNOSIS — K8020 Calculus of gallbladder without cholecystitis without obstruction: Secondary | ICD-10-CM | POA: Insufficient documentation

## 2024-04-18 DIAGNOSIS — M19012 Primary osteoarthritis, left shoulder: Secondary | ICD-10-CM | POA: Diagnosis not present

## 2024-04-18 DIAGNOSIS — Z801 Family history of malignant neoplasm of trachea, bronchus and lung: Secondary | ICD-10-CM | POA: Insufficient documentation

## 2024-04-18 DIAGNOSIS — E66813 Obesity, class 3: Secondary | ICD-10-CM | POA: Insufficient documentation

## 2024-04-18 DIAGNOSIS — Z90722 Acquired absence of ovaries, bilateral: Secondary | ICD-10-CM | POA: Insufficient documentation

## 2024-04-18 DIAGNOSIS — E785 Hyperlipidemia, unspecified: Secondary | ICD-10-CM | POA: Insufficient documentation

## 2024-04-18 DIAGNOSIS — M47814 Spondylosis without myelopathy or radiculopathy, thoracic region: Secondary | ICD-10-CM | POA: Insufficient documentation

## 2024-04-18 DIAGNOSIS — I700 Atherosclerosis of aorta: Secondary | ICD-10-CM | POA: Diagnosis not present

## 2024-04-18 DIAGNOSIS — J45909 Unspecified asthma, uncomplicated: Secondary | ICD-10-CM | POA: Insufficient documentation

## 2024-04-18 MED ORDER — DEXAMETHASONE 4 MG PO TABS: ORAL_TABLET | ORAL | 6 refills | Status: DC

## 2024-04-18 MED ORDER — PROCHLORPERAZINE MALEATE 10 MG PO TABS: 10.0000 mg | ORAL_TABLET | Freq: Four times a day (QID) | ORAL | 1 refills | Status: AC | PRN

## 2024-04-18 MED ORDER — ONDANSETRON HCL 8 MG PO TABS: 8.0000 mg | ORAL_TABLET | Freq: Three times a day (TID) | ORAL | 1 refills | Status: AC | PRN

## 2024-04-18 NOTE — Assessment & Plan Note (Signed)
 The calculated treatment dose is very high due to large body habitus I plan to adjust the dose of her treatment upfront

## 2024-04-18 NOTE — Progress Notes (Signed)
 [facility] CONSULT NOTE  Patient Care Team: Brandy Rollene BRAVO, MD as PCP - General Brandy Jayson MATSU, MD as PCP - Cardiology (Cardiology)  ASSESSMENT & PLAN:  Endometrial cancer [facility]) The patient was diagnosed with uterine cancer after presentation with postmenopausal bleeding, leading to imaging study and subsequent surgery in October 2025 Final pathology: High-grade endometrioid adenocarcinoma, FIGO grade 3, p53 mutated, overall stage IIc  We reviewed the NCCN guidelines We discussed the role of chemotherapy. The intent is of curative intent.  We discussed some of the risks, benefits, side-effects of carboplatin & Taxol. Treatment is intravenous, every 3 weeks x 6 cycles  Some of the short term side-effects included, though not limited to, including weight loss, life threatening infections, risk of allergic reactions, need for transfusions of blood products, nausea, vomiting, change in bowel habits, loss of hair, admission to hospital for various reasons, and risks of death.   Long term side-effects are also discussed including risks of infertility, permanent damage to nerve function, hearing loss, chronic fatigue, kidney damage with possibility needing hemodialysis, and rare secondary malignancy including bone marrow disorders.  The patient is aware that the response rates discussed earlier is not guaranteed.  After a long discussion, patient made an informed decision to proceed with the prescribed plan of care.   Patient education material was dispensed. We discussed premedication with dexamethasone  before chemotherapy. We discussed port placement that the patient declined port placement She is aware that if she has no good venous access during treatment, she would need to be scheduled for port placement in the future She is scheduled for chemo education class today  Obesity, Class III, BMI 40-49.9 (morbid obesity) (HCC) The calculated treatment dose is very high  due to large body habitus I plan to adjust the dose of her treatment upfront  Orders Placed This Encounter  Procedures   CBC with Differential (Cancer Center Only)    Standing Status:   Future    Expected Date:   04/26/2024    Expiration Date:   04/26/2025   CMP (Cancer Center only)    Standing Status:   Future    Expected Date:   04/26/2024    Expiration Date:   04/26/2025   CBC with Differential (Cancer Center Only)    Standing Status:   Future    Expected Date:   05/17/2024    Expiration Date:   05/17/2025   CMP (Cancer Center only)    Standing Status:   Future    Expected Date:   05/17/2024    Expiration Date:   05/17/2025   CBC with Differential (Cancer Center Only)    Standing Status:   Future    Expected Date:   06/14/2024    Expiration Date:   06/14/2025   CMP (Cancer Center only)    Standing Status:   Future    Expected Date:   06/14/2024    Expiration Date:   06/14/2025   CBC with Differential (Cancer Center Only)    Standing Status:   Future    Expected Date:   07/05/2024    Expiration Date:   07/05/2025   CMP (Cancer Center only)    Standing Status:   Future    Expected Date:   07/05/2024    Expiration Date:   07/05/2025   CBC with Differential (Cancer Center Only)    Standing Status:   Future    Expected Date:   07/26/2024    Expiration Date:   07/26/2025  CMP (Cancer Center only)    Standing Status:   Future    Expected Date:   07/26/2024    Expiration Date:   07/26/2025   CBC with Differential (Cancer Center Only)    Standing Status:   Future    Expected Date:   08/16/2024    Expiration Date:   08/16/2025   CMP (Cancer Center only)    Standing Status:   Future    Expected Date:   08/16/2024    Expiration Date:   08/16/2025    The total time spent in the appointment was 60 minutes encounter with patients including review of chart and various tests results, discussions about plan of care and coordination of care plan   All questions were answered. The patient knows to call  the clinic with any problems, questions or concerns. No barriers to learning was detected.  Brandy Bedford, MD 11/6/20253:13 PM  CHIEF COMPLAINTS/PURPOSE OF CONSULTATION:  High risk uterine cancer, for adjuvant treatment  HISTORY OF PRESENTING ILLNESS:  Brandy Dean 73 y.o. female is here because of recent diagnosis of uterine cancer She is doing well after surgery Her initial presentation was due to postmenopausal bleeding  I have reviewed her chart and materials related to her cancer extensively and collaborated history with the patient. Summary of oncologic history is as follows: Oncology History  Endometrial cancer (HCC)  01/23/2024 Imaging   US  pelvis Prominent endometrium at 5 cm. In the setting of post-menopausal bleeding, endometrial sampling is indicated to exclude carcinoma. If results are benign, sonohysterogram should be considered for focal lesion work-up.   02/27/2024 Surgery   PreOp: postmenopausal bleeding PostOp: same Procedure:  Hysteroscopy, Dilation and Curettage, Myosure resection Surgeon: Dr. Delon Dean Anesthesia: General Findings: 9cm uterus with thickened endometrium- significant blood and tissue noted making visualization challenging.  Questionable intracavitary fibroid also noted.  Both ostia visualized with some difficulty.     02/27/2024 Pathology Results   SURGICAL PATHOLOGY  CASE: [id number]  PATIENT: Brandy Dean  Surgical Pathology Report   Clinical History: N95.0-PMB (las)   FINAL MICROSCOPIC DIAGNOSIS:   A. 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    03/15/2024 Initial Diagnosis   Endometrial cancer (HCC)   03/22/2024 Imaging   1. Heterogeneous endometrial mass measures 5.9 x 3.1 cm, consistent with known endometrial carcinoma. 2. No evidence of metastatic disease within the chest, abdomen, or pelvis. 3. Cholelithiasis. 4. Colonic  stool burden may reflect constipation. 5. Aortic atherosclerosis.   03/27/2024 Surgery   Pre-operative Diagnosis: endometrial cancer, high grade, endometrioid   Post-operative Diagnosis: same, significant retroperitoneal adiposity and narrow pelvis   Operation: Robotic-assisted laparoscopic total hysterectomy with bilateral salpingoophorectomy, SLN biopsy bilaterally, cystoscopy Morbid obesity requiring additional OR personnel for positioning and retraction. Obesity made retroperitoneal visualization limited and increased the complexity of the case and necessitated additional instrumentation for retraction. Obesity related complexity increased the duration of the procedure by 60 minutes.    Surgeon: Viktoria Crank MD    Operative Findings: On EUA, moderately mobile uterus. On intra-abdomina entry, omentum adherent to the abdominal wall in the midline from just superior to the umbilicus to above the falciform ligament. Normal upper abdominal survey. Normal appearing small and large bowel, omentum. Uterus 10 cm and normal in appearance with some fibrous adhesions to the anterior abdominal wall c/w prior c-sections. Normal appearing bilateral tubes and ovaries. SLN mapping successful to bilateral deep obturator SLNs. No ascites. No obvious adenopathy.  On cystoscopy, dome intact, two ureteral orifices on the right. Good efflux noted from all three ureteral orifices.    03/27/2024 Pathology Results   SURGICAL PATHOLOGY   CASE: [id number]  PATIENT: Brandy Dean  Surgical Pathology Report   FINAL MICROSCOPIC DIAGNOSIS:   A. UTERUS, CERVIX, BILATERAL FALLOPIAN TUBES AND OVARIES, HYSTERECTOMY:       Endometrioid adenocarcinoma, FIGO grade 3.       Carcinoma invades 50% of endometrium (5 mm / 10 mm).       No lymphovascular invasion identified.       Benign leiomyomata.       Benign bilateral fimbriated fallopian tubes.       Benign bilateral ovaries.       See oncology table.   B.  SENTINEL LYMPH NODE, RIGHT OBTURATOR, EXCISION:       One lymph node, negative for metastatic carcinoma (0/1).   C. SENTINEL LYMPH NODE, LEFT OBTURATOR, EXCISION:       One lymph node, negative for metastatic carcinoma (0/1).   ONCOLOGY TABLE:  UTERUS, CARCINOMA OR CARCINOSARCOMA: Resection  Procedure: Total hysterectomy and bilateral salpingo-oophorectomy Histologic Type: Endometrioid adenocarcinoma Histologic Grade: FIGO grade 3 Myometrial Invasion:      Depth of Myometrial Invasion (mm): 5      Myometrial Thickness (mm): 10      Percentage of Myometrial Invasion: 50% Uterine Serosa Involvement: Not identified Cervical stromal Involvement: Not identified Extent of involvement of other tissue/organs: Not identified Peritoneal/Ascitic Fluid: Negative Lymphovascular Invasion: Not identified  Regional Lymph Nodes:      Pelvic Lymph Nodes Examined:          [2] Sentinel          [0] Non-sentinel          [2] Total      Pelvic Lymph Nodes with Metastasis: 0          Macrometastasis: (>2.0 mm): 0          Micrometastasis: (>0.2 mm and < 2.0 mm): 0          Isolated Tumor Cells (<0.2 mm): 0          Laterality of Lymph Node with Tumor: 0          Extracapsular Extension: 0      Para-aortic Lymph Nodes Examined:          [0] Sentinel          [0] Non-sentinel          [0] Total      Para-aortic Lymph Nodes with Metastasis: NA          Macrometastasis: (>2.0 mm): NA          Micrometastasis:  (>0.2 mm and < 2.0 mm): NA          Isolated Tumor Cells (<0.2 mm): NA          Laterality of Lymph Node with Tumor: NA          Extracapsular Extension: NA Distant Metastasis:      Distant Site(s) Involved: Not applicable Pathologic Stage Classification (pTNM, AJCC 8th Edition): pT1b, pN0 Ancillary Studies: MMR / MSI testing will be ordered    04/17/2024 Cancer Staging   Staging form: Corpus Uteri - Carcinoma and Carcinosarcoma, AJCC 8th Edition and FIGO 2023 - Clinical stage from  04/17/2024: FIGO Stage IIC, calculated as Stage IB (cT1b, cN0, cM0, p53+) - Signed by Lonn Hicks, MD on 04/17/2024 Stage prefix: Initial diagnosis  04/26/2024 -  Chemotherapy   Patient is on Treatment Plan : UTERINE Carboplatin AUC 6 + Paclitaxel q21d       MEDICAL HISTORY:  Past Medical History:  Diagnosis Date   Bacterial pneumonia    March  9-12,  2011   Bronchial asthma    Cancer [facility])    Essential hypertension    Heart murmur    Hyperlipidemia    Morbid obesity (HCC)     SURGICAL HISTORY: Past Surgical History:  Procedure Laterality Date   CESAREAN SECTION  1988 & 1991   CYSTOSCOPY  03/27/2024   Procedure: CYSTOSCOPY;  Surgeon: Viktoria Comer SAUNDERS, MD;  Location: [facility];  Service: Gynecology;;   HYSTEROSCOPY WITH D & C N/A 02/27/2024   Procedure: DILATATION AND CURETTAGE /HYSTEROSCOPY;  Surgeon: Ozan, Jalene, DO;  Location: [facility];  Service: Gynecology;  Laterality: N/A;   INJECTION, FOR SENTINEL LYMPH NODE IDENTIFICATION N/A 03/27/2024   Procedure: INJECTION, FOR SENTINEL LYMPH NODE IDENTIFICATION;  Surgeon: Viktoria Comer SAUNDERS, MD;  Location: [facility];  Service: Gynecology;  Laterality: N/A;   LYMPH NODE BIOPSY N/A 03/27/2024   Procedure: LYMPH NODE BIOPSY;  Surgeon: Viktoria Comer SAUNDERS, MD;  Location: [facility];  Service: Gynecology;  Laterality: N/A;  POSSIBLE LYMPH NODE DISSECTION   RIght and left keloid ears  06/13/1966   TOTAL LAPAROSCOPIC HYSTERECTOMY WITH BILATERAL SALPINGO OOPHORECTOMY Bilateral 03/27/2024   Procedure: HYSTERECTOMY, TOTAL, LAPAROSCOPIC, WITH BILATERAL SALPINGO-OOPHORECTOMY;  Surgeon: Viktoria Comer SAUNDERS, MD;  Location: [facility];  Service: Gynecology;  Laterality: Bilateral;  POSSIBLE LAPAROTOMY    SOCIAL HISTORY: Social History   Socioeconomic History   Marital status: Legally Separated    Spouse name: Not on file   Number of children: 3   Years of education: Not on file   Highest education level: Not on file  Occupational History    Employer:  [employer]  Tobacco Use   Smoking status: Former    Current packs/day: 0.00    Types: Cigarettes    Quit date: 06/14/2007    Years since quitting: 16.8   Smokeless tobacco: Never  Vaping Use   Vaping status: Never Used  Substance and Sexual Activity   Alcohol use: Yes    Comment: wine occ   Drug use: No   Sexual activity: Not Currently    Birth control/protection: Post-menopausal  Other Topics Concern   Not on file  Social History Narrative   Not on file   Social Drivers of Health   Financial Resource Strain: Low Risk  (08/25/2020)   Overall Financial Resource Strain (CARDIA)    Difficulty of Paying Living Expenses: Not hard at all  Food Insecurity: No Food Insecurity (03/27/2024)   Hunger Vital Sign    Worried About Running Out of Food in the Last Year: Never true    Ran Out of Food in the Last Year: Never true  Transportation Needs: No Transportation Needs (03/27/2024)   PRAPARE - Administrator, Civil Service (Medical): No    Lack of Transportation (Non-Medical): No  Physical Activity: Insufficiently Active (08/25/2020)   Exercise Vital Sign    Days of Exercise per Week: 5 days    Minutes of Exercise per Session: 20 min  Stress: No Stress Concern Present (08/25/2020)   [employer] of Occupational Health - Occupational Stress Questionnaire    Feeling of Stress : Not at all  Social Connections: Moderately Integrated (03/28/2024)   Social Connection and Isolation Panel    Frequency of  Communication with Friends and Family: More than three times a week    Frequency of Social Gatherings with Friends and Family: More than three times a week    Attends Religious Services: More than 4 times per year    Active Member of [employer] or Organizations: Yes    Attends Banker Meetings: More than 4 times per year    Marital Status: Separated  Intimate Partner Violence: Not At Risk (03/27/2024)   Humiliation, Afraid, Rape, and Kick questionnaire     Fear of Current or Ex-Partner: No    Emotionally Abused: No    Physically Abused: No    Sexually Abused: No    FAMILY HISTORY: Family History  Problem Relation Age of Onset   Stroke Mother    Hypertension Mother    Cancer Father        Head and neck cancer   Esophageal cancer Father    Obesity Sister    Cancer Brother        lung   Lung cancer Brother    Breast cancer Neg Hx    Prostate cancer Neg Hx    Endometrial cancer Neg Hx    Ovarian cancer Neg Hx    Colon cancer Neg Hx    Pancreatic cancer Neg Hx     ALLERGIES:  is allergic to benzonatate.  MEDICATIONS:  Current Outpatient Medications  Medication Sig Dispense Refill   dexamethasone  (DECADRON ) 4 MG tablet Take 2 tabs at the night before and 2 tab the morning of chemotherapy, every 3 weeks, by mouth x 6 cycles 24 tablet 6   acetaminophen  (TYLENOL ) 500 MG tablet Take 500-1,000 mg by mouth every 6 (six) hours as needed for moderate pain (pain score 4-6).     Albuterol -Budesonide  (AIRSUPRA ) 90-80 MCG/ACT AERO Inhale 2 puffs into the lungs as needed. Inhale 2 puffs as needed for wheezing, maximum 12 puffs/24 hours 10.7 g 5   BREZTRI  AEROSPHERE 160-9-4.8 MCG/ACT AERO inhaler INHALE 2 PUFFS INTO THE LUNGS TWICE DAILY 10.7 g 11   Cholecalciferol (VITAMIN D3 PO) Take 1 tablet by mouth daily.     clotrimazole -betamethasone  (LOTRISONE ) cream APPLY TOPICALLY TO THE AFFECTED AREA TWICE DAILY 45 g 0   ondansetron  (ZOFRAN ) 8 MG tablet Take 1 tablet (8 mg total) by mouth every 8 (eight) hours as needed for nausea or vomiting. Start on the third day after chemotherapy. 30 tablet 1   polyethylene glycol (MIRALAX) 17 g packet Take 17 g by mouth daily. For AFTER surgery, do not take if having diarrhea 14 each 0   prochlorperazine (COMPAZINE) 10 MG tablet Take 1 tablet (10 mg total) by mouth every 6 (six) hours as needed for nausea or vomiting. 30 tablet 1   triamterene -hydrochlorothiazide (MAXZIDE) 75-50 MG tablet Take 1 tablet by mouth  daily. 30 tablet 3   No current facility-administered medications for this visit.    REVIEW OF SYSTEMS:   Constitutional: Denies fevers, chills or abnormal night sweats Eyes: Denies blurriness of vision, double vision or watery eyes Ears, nose, mouth, throat, and face: Denies mucositis or sore throat Respiratory: Denies cough, dyspnea or wheezes Cardiovascular: Denies palpitation, chest discomfort or lower extremity swelling Gastrointestinal:  Denies nausea, heartburn or change in bowel habits Skin: Denies abnormal skin rashes Lymphatics: Denies new lymphadenopathy or easy bruising Neurological:Denies numbness, tingling or new weaknesses Behavioral/Psych: Mood is stable, no new changes  All other systems were reviewed with the patient and are negative.  PHYSICAL EXAMINATION: ECOG PERFORMANCE STATUS: 1 -  Symptomatic but completely ambulatory  Vitals:   04/18/24 1408  BP: (!) 141/59  Pulse: (!) 59  Resp: 18  Temp: 99.6 F (37.6 C)  SpO2: 95%   Filed Weights   04/18/24 1408  Weight: 248 lb 9.6 oz (112.8 kg)    GENERAL:alert, no distress and comfortable SKIN: skin color, texture, turgor are normal, no rashes or significant lesions EYES: normal, conjunctiva are pink and non-injected, sclera clear OROPHARYNX:no exudate, no erythema and lips, buccal mucosa, and tongue normal  NECK: supple, thyroid normal size, non-tender, without nodularity LYMPH:  no palpable lymphadenopathy in the cervical, axillary or inguinal LUNGS: clear to auscultation and percussion with normal breathing effort HEART: regular rate & rhythm with soft ejection systolic murmur and no lower extremity edema ABDOMEN:abdomen soft, non-tender and normal bowel sounds.  No other well-healed surgical scar Musculoskeletal:no cyanosis of digits and no clubbing  PSYCH: alert & oriented x 3 with fluent speech NEURO: no focal motor/sensory deficits  LABORATORY DATA:  I have reviewed the data as listed Lab Results   Component Value Date   WBC 11.4 (H) 03/28/2024   HGB 12.1 03/28/2024   HCT 38.6 03/28/2024   MCV 86.9 03/28/2024   PLT 194 03/28/2024   Recent Labs    07/21/23 1557 02/16/24 1613 03/22/24 0830 03/28/24 0646  NA 137 137 137 136  K 4.2 4.0 4.5 4.8  CL 97 98 102 101  CO2 25 26 29 29   GLUCOSE 83 85 87 100*  BUN 15 18 14 11   CREATININE 0.78 0.77 0.71 0.63  CALCIUM  10.7* 10.5* 10.8* 9.9  GFRNONAA  --   --  >60 >60  PROT 7.4 6.8 7.1  --   ALBUMIN 4.6 4.5 4.2  --   AST 13 13 18   --   ALT 10 7 7   --   ALKPHOS 69 61 64  --   BILITOT 0.6 0.8 0.8  --     RADIOGRAPHIC STUDIES: I have personally reviewed the radiological images as listed and agreed with the findings in the report. CT CHEST ABDOMEN PELVIS W CONTRAST Result Date: 03/25/2024 CLINICAL DATA:  Genital cancer staging, * Tracking Code: BO *. EXAM: CT CHEST, ABDOMEN, AND PELVIS WITH CONTRAST TECHNIQUE: Multidetector CT imaging of the chest, abdomen and pelvis was performed following the standard protocol during bolus administration of intravenous contrast. RADIATION DOSE REDUCTION: This exam was performed according to the departmental dose-optimization program which includes automated exposure control, adjustment of the mA and/or kV according to patient size and/or use of iterative reconstruction technique. CONTRAST:  OMNIPAQUE  IOHEXOL  300 MG/ML  SOLN COMPARISON:  Pelvic ultrasound January 23, 2024. FINDINGS: CT CHEST FINDINGS Cardiovascular: Aortic atherosclerosis. Coronary artery calcifications. Normal size heart. No significant pericardial effusion/thickening. Mediastinum/Nodes: No suspicious thyroid nodule. No pathologically enlarged mediastinal, hilar or axillary lymph nodes. Esophagus is grossly unremarkable. Lungs/Pleura: No suspicious pulmonary nodules or masses. Scattered atelectasis/scarring. No pleural effusion. No pneumothorax. Musculoskeletal: No aggressive lytic or blastic lesion of bone. Thoracic spondylosis.  Degenerative change of the bilateral shoulders with a left shoulder joint effusion and bursal fluid. CT ABDOMEN PELVIS FINDINGS Hepatobiliary: No suspicious hepatic lesion. Cholelithiasis. No biliary ductal dilation. Pancreas: No pancreatic ductal dilation or evidence of acute inflammation. Spleen: No splenomegaly. Adrenals/Urinary Tract: No suspicious adrenal nodule/mass. No hydronephrosis. Kidneys demonstrate symmetric enhancement. Urinary bladder is unremarkable for degree of distension. Stomach/Bowel: Radiopaque enteric contrast material traverses distal loops of small bowel. Colonic stool burden may reflect constipation. No evidence of bowel obstruction. Vascular/Lymphatic: Aortic  atherosclerosis. No pathologically enlarged abdominal or pelvic lymph nodes. Reproductive: Heterogeneous endometrial mass measures 5.9 x 3.1 cm on image 87/2. Other: No significant abdominopelvic free fluid. Musculoskeletal: No aggressive lytic or blastic lesion of bone. Lumbar spondylosis. IMPRESSION: 1. Heterogeneous endometrial mass measures 5.9 x 3.1 cm, consistent with known endometrial carcinoma. 2. No evidence of metastatic disease within the chest, abdomen, or pelvis. 3. Cholelithiasis. 4. Colonic stool burden may reflect constipation. 5. Aortic atherosclerosis. Aortic Atherosclerosis (ICD10-I70.0). Electronically Signed   By: Reyes Holder M.D.   On: 03/25/2024 08:31

## 2024-04-18 NOTE — Progress Notes (Signed)
 Met with Brandy Dean after her appointments with Dr. Shannon and Dr. Lonn.  Provided her with the [employer] and encouraged her to call with any questions or needs.

## 2024-04-18 NOTE — Assessment & Plan Note (Addendum)
 The patient was diagnosed with uterine cancer after presentation with postmenopausal bleeding, leading to imaging study and subsequent surgery in October 2025 Final pathology: High-grade endometrioid adenocarcinoma, FIGO grade 3, p53 mutated, overall stage IIc  We reviewed the NCCN guidelines We discussed the role of chemotherapy. The intent is of curative intent.  We discussed some of the risks, benefits, side-effects of carboplatin & Taxol. Treatment is intravenous, every 3 weeks x 6 cycles  Some of the short term side-effects included, though not limited to, including weight loss, life threatening infections, risk of allergic reactions, need for transfusions of blood products, nausea, vomiting, change in bowel habits, loss of hair, admission to hospital for various reasons, and risks of death.   Long term side-effects are also discussed including risks of infertility, permanent damage to nerve function, hearing loss, chronic fatigue, kidney damage with possibility needing hemodialysis, and rare secondary malignancy including bone marrow disorders.  The patient is aware that the response rates discussed earlier is not guaranteed.  After a long discussion, patient made an informed decision to proceed with the prescribed plan of care.   Patient education material was dispensed. We discussed premedication with dexamethasone  before chemotherapy. We discussed port placement that the patient declined port placement She is aware that if she has no good venous access during treatment, she would need to be scheduled for port placement in the future She is scheduled for chemo education class today

## 2024-04-19 ENCOUNTER — Other Ambulatory Visit: Payer: Self-pay

## 2024-04-19 NOTE — Progress Notes (Addendum)
 Pharmacist Chemotherapy Monitoring - Initial Assessment    Anticipated start date: 04/26/24   The following has been reviewed per standard work regarding the patient's treatment regimen: The patient's diagnosis, treatment plan and drug doses, and organ/hematologic function Lab orders and baseline tests specific to treatment regimen  The treatment plan start date, drug sequencing, and pre-medications Prior authorization status  Patient's documented medication list, including drug-drug interaction screen and prescriptions for anti-emetics and supportive care specific to the treatment regimen The drug concentrations, fluid compatibility, administration routes, and timing of the medications to be used The patient's access for treatment and lifetime cumulative dose history, if applicable  The patient's medication allergies and previous infusion related reactions, if applicable   Changes made to treatment plan:  N/A  Follow up needed:  1) Follow up PA - pending   Bridgett Leotis Helling, RPH, BCPS, BCOP 04/19/2024  1:03 PM

## 2024-04-22 ENCOUNTER — Telehealth: Payer: Self-pay

## 2024-04-22 NOTE — Telephone Encounter (Signed)
 Returned her call. She is requesting a letter for work.  Called her back and faxed requested letter to [facility] to the attention of [employer]. Faxed to [phone], received fax confirmation.

## 2024-04-25 ENCOUNTER — Encounter: Payer: Self-pay | Admitting: Gynecologic Oncology

## 2024-04-25 ENCOUNTER — Other Ambulatory Visit: Payer: Self-pay | Admitting: Hematology and Oncology

## 2024-04-25 ENCOUNTER — Inpatient Hospital Stay

## 2024-04-25 ENCOUNTER — Ambulatory Visit: Admitting: Gynecologic Oncology

## 2024-04-25 VITALS — BP 129/88 | HR 60 | Temp 98.2°F | Resp 20 | Wt 238.0 lb

## 2024-04-25 DIAGNOSIS — C541 Malignant neoplasm of endometrium: Secondary | ICD-10-CM

## 2024-04-25 DIAGNOSIS — Z5111 Encounter for antineoplastic chemotherapy: Secondary | ICD-10-CM | POA: Diagnosis not present

## 2024-04-25 DIAGNOSIS — Z90722 Acquired absence of ovaries, bilateral: Secondary | ICD-10-CM

## 2024-04-25 DIAGNOSIS — Z9079 Acquired absence of other genital organ(s): Secondary | ICD-10-CM

## 2024-04-25 DIAGNOSIS — Z7189 Other specified counseling: Secondary | ICD-10-CM

## 2024-04-25 DIAGNOSIS — Z90710 Acquired absence of both cervix and uterus: Secondary | ICD-10-CM

## 2024-04-25 LAB — CMP (CANCER CENTER ONLY)
ALT: 7 U/L (ref 0–44)
AST: 12 U/L — ABNORMAL LOW (ref 15–41)
Albumin: 4.2 g/dL (ref 3.5–5.0)
Alkaline Phosphatase: 62 U/L (ref 38–126)
Anion gap: 7 (ref 5–15)
BUN: 15 mg/dL (ref 8–23)
CO2: 28 mmol/L (ref 22–32)
Calcium: 10.5 mg/dL — ABNORMAL HIGH (ref 8.9–10.3)
Chloride: 102 mmol/L (ref 98–111)
Creatinine: 0.64 mg/dL (ref 0.44–1.00)
GFR, Estimated: >60 mL/min (ref >60)
Glucose, Bld: 99 mg/dL (ref 70–99)
Potassium: 3.9 mmol/L (ref 3.5–5.1)
Sodium: 137 mmol/L (ref 135–145)
Total Bilirubin: 0.5 mg/dL (ref 0.0–1.2)
Total Protein: 7.4 g/dL (ref 6.5–8.1)

## 2024-04-25 LAB — CBC WITH DIFFERENTIAL (CANCER CENTER ONLY)
Abs Immature Granulocytes: 0.03 K/uL (ref 0.00–0.07)
Basophils Absolute: 0 K/uL (ref 0.0–0.1)
Basophils Relative: 0 %
Eosinophils Absolute: 0.4 K/uL (ref 0.0–0.5)
Eosinophils Relative: 6 %
HCT: 38.5 % (ref 36.0–46.0)
Hemoglobin: 12.7 g/dL (ref 12.0–15.0)
Immature Granulocytes: 1 %
Lymphocytes Relative: 28 %
Lymphs Abs: 1.8 K/uL (ref 0.7–4.0)
MCH: 26.9 pg (ref 26.0–34.0)
MCHC: 33 g/dL (ref 30.0–36.0)
MCV: 81.6 fL (ref 80.0–100.0)
Monocytes Absolute: 0.6 K/uL (ref 0.1–1.0)
Monocytes Relative: 9 %
Neutro Abs: 3.7 K/uL (ref 1.7–7.7)
Neutrophils Relative %: 56 %
Platelet Count: 230 K/uL (ref 150–400)
RBC: 4.72 MIL/uL (ref 3.87–5.11)
RDW: 14.5 % (ref 11.5–15.5)
WBC Count: 6.6 K/uL (ref 4.0–10.5)
nRBC: 0 % (ref 0.0–0.2)

## 2024-04-25 NOTE — Progress Notes (Signed)
 Gynecologic Oncology Return Clinic Visit  04/25/24  Reason for Visit: treatment planning, follow-up  Treatment History: Oncology History  Endometrial cancer (HCC)  01/23/2024 Imaging   US  pelvis Prominent endometrium at 5 cm. In the setting of post-menopausal bleeding, endometrial sampling is indicated to exclude carcinoma. If results are benign, sonohysterogram should be considered for focal lesion work-up.   02/27/2024 Surgery   PreOp: postmenopausal bleeding PostOp: same Procedure:  Hysteroscopy, Dilation and Curettage, Myosure resection Surgeon: Dr. Delon Prude Anesthesia: General Findings: 9cm uterus with thickened endometrium- significant blood and tissue noted making visualization challenging.  Questionable intracavitary fibroid also noted.  Both ostia visualized with some difficulty.     02/27/2024 Pathology Results   SURGICAL PATHOLOGY  CASE: [id number]  PATIENT: DELON NOVAK  Surgical Pathology Report   Clinical History: N95.0-PMB (las)   FINAL MICROSCOPIC DIAGNOSIS:   A. 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    03/15/2024 Initial Diagnosis   Endometrial cancer (HCC)   03/22/2024 Imaging   1. Heterogeneous endometrial mass measures 5.9 x 3.1 cm, consistent with known endometrial carcinoma. 2. No evidence of metastatic disease within the chest, abdomen, or pelvis. 3. Cholelithiasis. 4. Colonic stool burden may reflect constipation. 5. Aortic atherosclerosis.   03/27/2024 Surgery   Pre-operative Diagnosis: endometrial cancer, high grade, endometrioid   Post-operative Diagnosis: same, significant retroperitoneal adiposity and narrow pelvis   Operation: Robotic-assisted laparoscopic total hysterectomy with bilateral salpingoophorectomy, SLN biopsy bilaterally, cystoscopy Morbid obesity requiring additional OR personnel for positioning and retraction.  Obesity made retroperitoneal visualization limited and increased the complexity of the case and necessitated additional instrumentation for retraction. Obesity related complexity increased the duration of the procedure by 60 minutes.    Surgeon: Viktoria Crank MD    Operative Findings: On EUA, moderately mobile uterus. On intra-abdomina entry, omentum adherent to the abdominal wall in the midline from just superior to the umbilicus to above the falciform ligament. Normal upper abdominal survey. Normal appearing small and large bowel, omentum. Uterus 10 cm and normal in appearance with some fibrous adhesions to the anterior abdominal wall c/w prior c-sections. Normal appearing bilateral tubes and ovaries. SLN mapping successful to bilateral deep obturator SLNs. No ascites. No obvious adenopathy. On cystoscopy, dome intact, two ureteral orifices on the right. Good efflux noted from all three ureteral orifices.    03/27/2024 Pathology Results   SURGICAL PATHOLOGY   CASE: [id number]  PATIENT: DELON NOVAK  Surgical Pathology Report   FINAL MICROSCOPIC DIAGNOSIS:   A. UTERUS, CERVIX, BILATERAL FALLOPIAN TUBES AND OVARIES, HYSTERECTOMY:       Endometrioid adenocarcinoma, FIGO grade 3.       Carcinoma invades 50% of endometrium (5 mm / 10 mm).       No lymphovascular invasion identified.       Benign leiomyomata.       Benign bilateral fimbriated fallopian tubes.       Benign bilateral ovaries.       See oncology table.   B. SENTINEL LYMPH NODE, RIGHT OBTURATOR, EXCISION:       One lymph node, negative for metastatic carcinoma (0/1).   C. SENTINEL LYMPH NODE, LEFT OBTURATOR, EXCISION:       One lymph node, negative for metastatic carcinoma (0/1).   ONCOLOGY TABLE:  UTERUS, CARCINOMA OR CARCINOSARCOMA: Resection  Procedure: Total hysterectomy and bilateral salpingo-oophorectomy Histologic Type: Endometrioid adenocarcinoma Histologic Grade: FIGO grade 3 Myometrial Invasion:       Depth of Myometrial  Invasion (mm): 5      Myometrial Thickness (mm): 10      Percentage of Myometrial Invasion: 50% Uterine Serosa Involvement: Not identified Cervical stromal Involvement: Not identified Extent of involvement of other tissue/organs: Not identified Peritoneal/Ascitic Fluid: Negative Lymphovascular Invasion: Not identified  Regional Lymph Nodes:      Pelvic Lymph Nodes Examined:          [2] Sentinel          [0] Non-sentinel          [2] Total      Pelvic Lymph Nodes with Metastasis: 0          Macrometastasis: (>2.0 mm): 0          Micrometastasis: (>0.2 mm and < 2.0 mm): 0          Isolated Tumor Cells (<0.2 mm): 0          Laterality of Lymph Node with Tumor: 0          Extracapsular Extension: 0      Para-aortic Lymph Nodes Examined:          [0] Sentinel          [0] Non-sentinel          [0] Total      Para-aortic Lymph Nodes with Metastasis: NA          Macrometastasis: (>2.0 mm): NA          Micrometastasis:  (>0.2 mm and < 2.0 mm): NA          Isolated Tumor Cells (<0.2 mm): NA          Laterality of Lymph Node with Tumor: NA          Extracapsular Extension: NA Distant Metastasis:      Distant Site(s) Involved: Not applicable Pathologic Stage Classification (pTNM, AJCC 8th Edition): pT1b, pN0 Ancillary Studies: MMR / MSI testing will be ordered    04/17/2024 Cancer Staging   Staging form: Corpus Uteri - Carcinoma and Carcinosarcoma, AJCC 8th Edition and FIGO 2023 - Clinical stage from 04/17/2024: FIGO Stage IIC, calculated as Stage IB (cT1b, cN0, cM0, p53+) - Signed by Lonn Hicks, MD on 04/17/2024 Stage prefix: Initial diagnosis   04/26/2024 -  Chemotherapy   Patient is on Treatment Plan : UTERINE Carboplatin AUC 6 + Paclitaxel q21d       Interval History: Doing very well.  Denies abdominal pain.  Denies any vaginal bleeding.  Reports good bowel and bladder function.  Past Medical/Surgical History: Past Medical History:  Diagnosis Date    Bacterial pneumonia    March  9-12,  2011   Bronchial asthma    Cancer [facility])    Essential hypertension    Heart murmur    Hyperlipidemia    Morbid obesity [facility])     Past Surgical History:  Procedure Laterality Date   CESAREAN SECTION  1988 & 1991   CYSTOSCOPY  03/27/2024   Procedure: CYSTOSCOPY;  Surgeon: Viktoria Comer SAUNDERS, MD;  Location: [facility];  Service: Gynecology;;   HYSTEROSCOPY WITH D & C N/A 02/27/2024   Procedure: DILATATION AND CURETTAGE /HYSTEROSCOPY;  Surgeon: Ozan, Siham, DO;  Location: [facility];  Service: Gynecology;  Laterality: N/A;   INJECTION, FOR SENTINEL LYMPH NODE IDENTIFICATION N/A 03/27/2024   Procedure: INJECTION, FOR SENTINEL LYMPH NODE IDENTIFICATION;  Surgeon: Viktoria Comer SAUNDERS, MD;  Location: [facility];  Service: Gynecology;  Laterality: N/A;   LYMPH NODE BIOPSY N/A 03/27/2024  Procedure: LYMPH NODE BIOPSY;  Surgeon: Viktoria Comer SAUNDERS, MD;  Location: [facility];  Service: Gynecology;  Laterality: N/A;  POSSIBLE LYMPH NODE DISSECTION   RIght and left keloid ears  06/13/1966   TOTAL LAPAROSCOPIC HYSTERECTOMY WITH BILATERAL SALPINGO OOPHORECTOMY Bilateral 03/27/2024   Procedure: HYSTERECTOMY, TOTAL, LAPAROSCOPIC, WITH BILATERAL SALPINGO-OOPHORECTOMY;  Surgeon: Viktoria Comer SAUNDERS, MD;  Location: [facility];  Service: Gynecology;  Laterality: Bilateral;  POSSIBLE LAPAROTOMY    Family History  Problem Relation Age of Onset   Stroke Mother    Hypertension Mother    Cancer Father        Head and neck cancer   Esophageal cancer Father    Obesity Sister    Cancer Brother        lung   Lung cancer Brother    Breast cancer Neg Hx    Prostate cancer Neg Hx    Endometrial cancer Neg Hx    Ovarian cancer Neg Hx    Colon cancer Neg Hx    Pancreatic cancer Neg Hx     Social History   Socioeconomic History   Marital status: Legally Separated    Spouse name: Not on file   Number of children: 3   Years of education: Not on file   Highest education level: Not on  file  Occupational History    Employer: [employer]  Tobacco Use   Smoking status: Former    Current packs/day: 0.00    Types: Cigarettes    Quit date: 06/14/2007    Years since quitting: 16.8   Smokeless tobacco: Never  Vaping Use   Vaping status: Never Used  Substance and Sexual Activity   Alcohol use: Yes    Comment: wine occ   Drug use: No   Sexual activity: Not Currently    Birth control/protection: Post-menopausal  Other Topics Concern   Not on file  Social History Narrative   Not on file   Social Drivers of Health   Financial Resource Strain: Low Risk  (08/25/2020)   Overall Financial Resource Strain (CARDIA)    Difficulty of Paying Living Expenses: Not hard at all  Food Insecurity: No Food Insecurity (03/27/2024)   Hunger Vital Sign    Worried About Running Out of Food in the Last Year: Never true    Ran Out of Food in the Last Year: Never true  Transportation Needs: No Transportation Needs (03/27/2024)   PRAPARE - Administrator, Civil Service (Medical): No    Lack of Transportation (Non-Medical): No  Physical Activity: Insufficiently Active (08/25/2020)   Exercise Vital Sign    Days of Exercise per Week: 5 days    Minutes of Exercise per Session: 20 min  Stress: No Stress Concern Present (08/25/2020)   [employer] of Occupational Health - Occupational Stress Questionnaire    Feeling of Stress : Not at all  Social Connections: Moderately Integrated (03/28/2024)   Social Connection and Isolation Panel    Frequency of Communication with Friends and Family: More than three times a week    Frequency of Social Gatherings with Friends and Family: More than three times a week    Attends Religious Services: More than 4 times per year    Active Member of [employer] or Organizations: Yes    Attends Engineer, Structural: More than 4 times per year    Marital Status: Separated    Current Medications:  Current Outpatient Medications:     acetaminophen  (TYLENOL ) 500 MG tablet,  Take 500-1,000 mg by mouth every 6 (six) hours as needed for moderate pain (pain score 4-6)., Disp: , Rfl:    Albuterol -Budesonide  (AIRSUPRA ) 90-80 MCG/ACT AERO, Inhale 2 puffs into the lungs as needed. Inhale 2 puffs as needed for wheezing, maximum 12 puffs/24 hours, Disp: 10.7 g, Rfl: 5   BREZTRI  AEROSPHERE 160-9-4.8 MCG/ACT AERO inhaler, INHALE 2 PUFFS INTO THE LUNGS TWICE DAILY, Disp: 10.7 g, Rfl: 11   Cholecalciferol (VITAMIN D3 PO), Take 1 tablet by mouth daily., Disp: , Rfl:    clotrimazole -betamethasone  (LOTRISONE ) cream, APPLY TOPICALLY TO THE AFFECTED AREA TWICE DAILY, Disp: 45 g, Rfl: 0   ondansetron  (ZOFRAN ) 8 MG tablet, Take 1 tablet (8 mg total) by mouth every 8 (eight) hours as needed for nausea or vomiting. Start on the third day after chemotherapy., Disp: 30 tablet, Rfl: 1   polyethylene glycol (MIRALAX) 17 g packet, Take 17 g by mouth daily. For AFTER surgery, do not take if having diarrhea, Disp: 14 each, Rfl: 0   prochlorperazine (COMPAZINE) 10 MG tablet, Take 1 tablet (10 mg total) by mouth every 6 (six) hours as needed for nausea or vomiting., Disp: 30 tablet, Rfl: 1   triamterene -hydrochlorothiazide (MAXZIDE) 75-50 MG tablet, Take 1 tablet by mouth daily., Disp: 30 tablet, Rfl: 3  Review of Systems: Denies appetite changes, fevers, chills, fatigue, unexplained weight changes. Denies hearing loss, neck lumps or masses, mouth sores, ringing in ears or voice changes. Denies cough or wheezing.  Denies shortness of breath. Denies chest pain or palpitations. Denies leg swelling. Denies abdominal distention, pain, blood in stools, constipation, diarrhea, nausea, vomiting, or early satiety. Denies pain with intercourse, dysuria, frequency, hematuria or incontinence. Denies hot flashes, pelvic pain, vaginal bleeding or vaginal discharge.   Denies joint pain, back pain or muscle pain/cramps. Denies itching, rash, or wounds. Denies dizziness,  headaches, numbness or seizures. Denies swollen lymph nodes or glands, denies easy bruising or bleeding. Denies anxiety, depression, confusion, or decreased concentration.  Physical Exam: BP 129/88 (BP Location: Left Arm, Patient Position: Sitting)   Pulse 60   Temp 98.2 F (36.8 C) (Oral)   Resp 20   Wt 238 lb (108 kg)   SpO2 94%   BMI 40.85 kg/m  General: Alert, oriented, no acute distress. HEENT: Posterior oropharynx clear, sclera anicteric. Chest: Unlabored breathing on room air. Abdomen: Obese, soft, nontender.  Normoactive bowel sounds.  No masses or hepatosplenomegaly appreciated.  Well-healed incisions. Extremities: Grossly normal range of motion.  Warm, well perfused.  No edema bilaterally. GU: Normal appearing external genitalia without erythema, excoriation, or lesions.  Speculum exam reveals cuff intact, suture visible, no bleeding or discharge.  Bimanual exam reveals cuff intact, no fluctuance or tenderness to palpation.    Laboratory & Radiologic Studies: A. UTERUS, CERVIX, BILATERAL FALLOPIAN TUBES AND OVARIES, HYSTERECTOMY:       Endometrioid adenocarcinoma, FIGO grade 3.       Carcinoma invades 50% of endometrium (5 mm / 10 mm).       No lymphovascular invasion identified.       Benign leiomyomata.       Benign bilateral fimbriated fallopian tubes.       Benign bilateral ovaries.       See oncology table.   B. SENTINEL LYMPH NODE, RIGHT OBTURATOR, EXCISION:       One lymph node, negative for metastatic carcinoma (0/1).   C. SENTINEL LYMPH NODE, LEFT OBTURATOR, EXCISION:       One lymph node, negative for metastatic  carcinoma (0/1).    FINAL MICROSCOPIC DIAGNOSIS:  - No malignant cells identified  - Reactive mesothelial cells present    Assessment & Plan: CARLOTTA TELFAIR is a 73 y.o. 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 50% MI, no LVI.  MMRp. P53 mutated. CARIS: MSS, MMRp, ER 2+, mutations in AKT1  and p53; PDL1 TPS 10%, HER2 neg (0%)   Doing well. Meeting milestones.  Reviewed continued expectations and restrictions.  We discussed pathology from surgery again.  She was given a copy of her pathology report.  Given high risk histology and somatic testing showing p53 mutation, it discussed again recommendation for adjuvant treatment with chemotherapy and radiation.  Patient is starting chemotherapy tomorrow.  Reviewed that I will see her once she has completed treatment.  Signs and symptoms were reviewed that should prompt a phone call before her next scheduled visit.  20 minutes of total time was spent for this patient encounter, including preparation, face-to-face counseling with the patient and coordination of care, and documentation of the encounter.  Comer Dollar, MD  Division of Gynecologic Oncology  Department of Obstetrics and Gynecology  [facility]

## 2024-04-25 NOTE — Patient Instructions (Signed)
 It was good to see you today.  You are healing very well after surgery.  Please remember, no heavy lifting/pulling/pushing for 6 weeks after surgery.  Nothing in the vagina for at least 12 weeks.   A little bit of spotting over the next couple of months would be normal as the stitch is at the top of the vagina start to dissolve.  If you have more than just spotting or bleeding that does not stop, discharge, or any other concerning symptoms over the next couple months, please call me.   As always, if you develop any new and concerning symptoms before your next visit, please call to see me sooner.  The symptoms would include vaginal bleeding, pelvic pain, change to bowel or bladder function, unintentional weight loss, or anything else that is concerning.   I will see you back for follow-up after you have finished your chemotherapy and radiation.

## 2024-04-26 ENCOUNTER — Inpatient Hospital Stay: Admitting: Licensed Clinical Social Worker

## 2024-04-26 ENCOUNTER — Inpatient Hospital Stay

## 2024-04-26 ENCOUNTER — Other Ambulatory Visit: Payer: Self-pay

## 2024-04-26 ENCOUNTER — Inpatient Hospital Stay: Admitting: Hematology and Oncology

## 2024-04-26 VITALS — BP 139/97 | HR 74 | Temp 98.0°F | Resp 20 | Ht 64.0 in | Wt 237.5 lb

## 2024-04-26 DIAGNOSIS — C541 Malignant neoplasm of endometrium: Secondary | ICD-10-CM

## 2024-04-26 DIAGNOSIS — Z5111 Encounter for antineoplastic chemotherapy: Secondary | ICD-10-CM | POA: Diagnosis not present

## 2024-04-26 MED ORDER — SODIUM CHLORIDE 0.9 % IV SOLN: INTRAVENOUS | Status: DC

## 2024-04-26 MED ORDER — SODIUM CHLORIDE 0.9 % IV SOLN: Freq: Once | INTRAVENOUS | Status: DC | PRN

## 2024-04-26 MED ORDER — FAMOTIDINE IN NACL 20-0.9 MG/50ML-% IV SOLN
20.0000 mg | Freq: Once | INTRAVENOUS | Status: AC
  Administered 2024-04-26: 20 mg via INTRAVENOUS
  Filled 2024-04-26: qty 50

## 2024-04-26 MED ORDER — SODIUM CHLORIDE 0.9 % IV SOLN
175.0000 mg/m2 | Freq: Once | INTRAVENOUS | Status: AC
  Administered 2024-04-26: 324 mg via INTRAVENOUS
  Filled 2024-04-26: qty 54

## 2024-04-26 MED ORDER — PALONOSETRON HCL INJECTION 0.25 MG/5ML
0.2500 mg | Freq: Once | INTRAVENOUS | Status: AC
  Administered 2024-04-26: 0.25 mg via INTRAVENOUS
  Filled 2024-04-26: qty 5

## 2024-04-26 MED ORDER — SODIUM CHLORIDE 0.9 % IV SOLN
521.4000 mg | Freq: Once | INTRAVENOUS | Status: AC
  Administered 2024-04-26: 520 mg via INTRAVENOUS
  Filled 2024-04-26: qty 52

## 2024-04-26 MED ORDER — METHYLPREDNISOLONE SODIUM SUCC 125 MG IJ SOLR
125.0000 mg | Freq: Once | INTRAMUSCULAR | Status: AC | PRN
  Administered 2024-04-26: 62.5 mg via INTRAVENOUS

## 2024-04-26 MED ORDER — DIPHENHYDRAMINE HCL 50 MG/ML IJ SOLN
25.0000 mg | Freq: Once | INTRAMUSCULAR | Status: AC
  Administered 2024-04-26: 25 mg via INTRAVENOUS
  Filled 2024-04-26: qty 1

## 2024-04-26 MED ORDER — FAMOTIDINE IN NACL 20-0.9 MG/50ML-% IV SOLN
20.0000 mg | Freq: Once | INTRAVENOUS | Status: AC | PRN
  Administered 2024-04-26: 20 mg via INTRAVENOUS

## 2024-04-26 MED ORDER — DIPHENHYDRAMINE HCL 50 MG/ML IJ SOLN
25.0000 mg | Freq: Once | INTRAMUSCULAR | Status: AC | PRN
  Administered 2024-04-26: 25 mg via INTRAVENOUS

## 2024-04-26 MED ORDER — APREPITANT 130 MG/18ML IV EMUL
130.0000 mg | Freq: Once | INTRAVENOUS | Status: AC
  Administered 2024-04-26: 130 mg via INTRAVENOUS
  Filled 2024-04-26: qty 18

## 2024-04-26 MED ORDER — DEXAMETHASONE SOD PHOSPHATE PF 10 MG/ML IJ SOLN
10.0000 mg | Freq: Once | INTRAMUSCULAR | Status: AC
  Administered 2024-04-26: 10 mg via INTRAVENOUS

## 2024-04-26 NOTE — Progress Notes (Signed)
 Hypersensitivity Reaction Note  Date of event: 04/26/24  Time of event: 1143   Type of event: Grade 2 (Moderate reaction; Requires therapy or infusion interruption, but responds promptly to interventions; prophylactic medications indicated for <24 hours)    Generic name of drug involved: Paclitaxel (Taxol)  Initial Presentation and Response:  The patient reported and showed signs of nausea, flushing, chest discomfort, cough, and feeling funny per patient during the infusion.   Provider notified of the hypersensitivity reaction: Dr. Lonn  Time of provider notification: [phone]  Initial Interventions Implemented:   Infusion stopped: Yes  Medications administered - see MAR for sequence and times of administration: Normal Saline 1 L IV, Diphenhydramine (Benadryl) 25 mg IV, Famotidine  (Pepcid ) 20 mg IV, and Methylprednisolone  (Solu-Medrol ) 62.5 mg IV  Additional interventions: N/A  Patient response to treatment: Symptoms resolved following interventions.  Remaining Course of Treatment:    The patient was unable to complete the infusion.  Was agent that likely caused hypersensitivity reaction added to Allergies List within EMR? Yes   Additional Information Regarding the Chain of Events (including reaction signs/symptoms, treatment administered, and outcome):  See flowsheet for vital signs. Per Dr Lonn, continue IVF x 1 hour, recheck vital signs, continue with Carboplatin only if stable, no Taxol retrial.   Patient monitored for 1 hour per Dr Buzz suggestion. VSS stable. Carboplatin administered, pt tolerated tx well. VSS at discharge, discharged ambulatory to lobby with rolator.

## 2024-04-26 NOTE — Patient Instructions (Signed)
 [facility] - A DEPT OF [facility]  Discharge Instructions: Thank you for choosing [facility] to provide your oncology and hematology care.   If you have a lab appointment with the Cancer Center, please go directly to the Cancer Center and check in at the registration area.   Wear comfortable clothing and clothing appropriate for easy access to any Portacath or PICC line.   We strive to give you quality time with your provider. You may need to reschedule your appointment if you arrive late (15 or more minutes).  Arriving late affects you and other patients whose appointments are after yours.  Also, if you miss three or more appointments without notifying the office, you may be dismissed from the clinic at the provider's discretion.      For prescription refill requests, have your pharmacy contact our office and allow 72 hours for refills to be completed.    Today you received the following chemotherapy and/or immunotherapy agents Taxol, Carboplatin      To help prevent nausea and vomiting after your treatment, we encourage you to take your nausea medication as directed.  BELOW ARE SYMPTOMS THAT SHOULD BE REPORTED IMMEDIATELY: *FEVER GREATER THAN 100.4 F (38 C) OR HIGHER *CHILLS OR SWEATING *NAUSEA AND VOMITING THAT IS NOT CONTROLLED WITH YOUR NAUSEA MEDICATION *UNUSUAL SHORTNESS OF BREATH *UNUSUAL BRUISING OR BLEEDING *URINARY PROBLEMS (pain or burning when urinating, or frequent urination) *BOWEL PROBLEMS (unusual diarrhea, constipation, pain near the anus) TENDERNESS IN MOUTH AND THROAT WITH OR WITHOUT PRESENCE OF ULCERS (sore throat, sores in mouth, or a toothache) UNUSUAL RASH, SWELLING OR PAIN  UNUSUAL VAGINAL DISCHARGE OR ITCHING   Items with * indicate a potential emergency and should be followed up as soon as possible or go to the Emergency Department if any problems should occur.  Please show the CHEMOTHERAPY ALERT CARD or  IMMUNOTHERAPY ALERT CARD at check-in to the Emergency Department and triage nurse.  Should you have questions after your visit or need to cancel or reschedule your appointment, please contact [facility] - A DEPT OF [facility]  Dept: [phone]  and follow the prompts.  Office hours are 8:00 a.m. to 4:30 p.m. Monday - Friday. Please note that voicemails left after 4:00 p.m. may not be returned until the following business day.  We are closed weekends and major holidays. You have access to a nurse at all times for urgent questions. Please call the main number to the clinic Dept: [phone] and follow the prompts.   For any non-urgent questions, you may also contact your provider using MyChart. We now offer e-Visits for anyone 64 and older to request care online for non-urgent symptoms. For details visit [email or web address].   Also download the MyChart app! Go to the app store, search "MyChart", open the app, select [facility], and log in with your MyChart username and password.

## 2024-04-26 NOTE — Progress Notes (Signed)
 [facility] OFFICE PROGRESS NOTE  Patient Care Team: [name], MD as PCP - General [name], MD as PCP - Cardiology (Cardiology)  Assessment & Plan Endometrial cancer [facility]) The patient developed acute allergic reaction to paclitaxel within minutes of infusion despite adequate premedications With additional premedications given, her symptoms subsequently resolved and her vital signs are stable She will resume the rest of her chemotherapy with carboplatin only and paclitaxel is added to her allergy list In the future, I plan to change her treatment to docetaxel and we will get new insurance authorization for that We will call the patient on Monday to check on her  No orders of the defined types were placed in this encounter.    [name], MD  INTERVAL HISTORY: She is seen urgently in the infusion room due to allergic reaction to paclitaxel The patient complained of chest tightness and shortness of breath She was given additional premedication Her vital signs are stable The patient is reevaluated again around 1230 and she is feeling back to normal She was able to complete the rest of her chemotherapy without problems  PHYSICAL EXAMINATION: ECOG PERFORMANCE STATUS: 1 - Symptomatic but completely ambulatory  GENERAL:alert, no distress and comfortable SKIN: skin color, texture, turgor are normal, no rashes or significant lesions EYES: normal, conjunctiva are pink and non-injected, sclera clear OROPHARYNX:no exudate, no erythema and lips, buccal mucosa, and tongue normal  NECK: supple, thyroid normal size, non-tender, without nodularity LYMPH:  no palpable lymphadenopathy in the cervical, axillary or inguinal LUNGS: clear to auscultation and percussion with normal breathing effort HEART: regular rate & rhythm and no murmurs and no lower extremity edema ABDOMEN:abdomen soft, non-tender and normal bowel sounds Musculoskeletal:no cyanosis of digits  and no clubbing  PSYCH: alert & oriented x 3 with fluent speech NEURO: no focal motor/sensory deficits

## 2024-04-26 NOTE — Progress Notes (Signed)
 [facility] Clinical Social Work  Initial Assessment   YAMILA CRAGIN is a 73 y.o. year old female presenting alone. Clinical Social Work was referred by new patient protocol for assessment of psychosocial needs.   SDOH (Social Determinants of Health) assessments performed: No SDOH Interventions    Flowsheet Row Office Visit from 03/15/2024 in [facility] - A Dept Of [place]  SDOH Interventions   Food Insecurity Interventions Intervention Not Indicated  Housing Interventions Intervention Not Indicated  Transportation Interventions Intervention Not Indicated  Utilities Interventions Intervention Not Indicated    SDOH Screenings   Food Insecurity: No Food Insecurity (03/27/2024)  Housing: Low Risk  (03/27/2024)  Transportation Needs: No Transportation Needs (03/27/2024)  Utilities: Not At Risk (03/27/2024)  Depression (PHQ2-9): Low Risk  (04/26/2024)  Financial Resource Strain: Low Risk  (08/25/2020)  Physical Activity: Insufficiently Active (08/25/2020)  Social Connections: Moderately Integrated (03/28/2024)  Stress: No Stress Concern Present (08/25/2020)  Tobacco Use: Medium Risk (04/25/2024)    PHQ 2/9:    04/26/2024    9:00 AM 03/21/2024   10:14 AM 02/16/2024    3:28 PM  Depression screen PHQ 2/9  Decreased Interest 0 0 0  Down, Depressed, Hopeless 0 0 0  PHQ - 2 Score 0 0 0  Altered sleeping  0 0  Tired, decreased energy  0 0  Change in appetite  0 0  Feeling bad or failure about yourself   0 0  Trouble concentrating  0 0  Moving slowly or fidgety/restless  0 0  Suicidal thoughts  0 0  PHQ-9 Score  0  0   Difficult doing work/chores  Not difficult at all Not difficult at all     Data saved with a previous flowsheet row definition     Distress Screen completed: No    04/18/2024    6:02 PM  ONCBCN DISTRESS SCREENING  Screening Type Initial Screening  How much distress have you been experiencing in the past week? (0-10) 3  Emotional  concerns type Worry or anxiety;Sadness or depression;Fear;Anger  Physical Concerns Type  Pain;Sleep;Changes in eating      Family/Social Information:  Family members/support persons in your life? Family, Friends, and [employer]. She has an extensive support Scientist, Forensic concerns: no, her children help and she can drive herself  Employment: working for [employer]. She is on leave currently until March. She enjoys working and staying busy .  Income source: Employment and Actor concerns: No Type of concern: None Food access concerns: no Religious or spiritual practice: Yes-part of a church and turns to faith to help cope Advanced directives: Not known Services Currently in place:  [employer] state health plan  Coping/ Adjustment to diagnosis: Patient understands treatment plan and what happens next? yes, she has had surgery already and now starting chemo and then brachytherapy. She was sad and crying when first diagnosed, but now is grateful that everything was removed and is ready to do next treatments to hopefully ensure she remains cancer free Patient reported stressors: Adjusting to my illness Current coping skills/ strengths: Ability for insight , Communication skills , Motivation for treatment/growth , Religious Affiliation , and Supportive family/friends     SUMMARY: Current SDOH Barriers:  No major barriers identified today  Clinical Social Work Clinical Goal(s):  No clinical social work goals at this time  Interventions: Discussed common feeling and emotions when being diagnosed with cancer, and the importance of support during  treatment Informed patient of the support team roles and support services at [facility] Provided CSW contact information and encouraged patient to call with any questions or concerns Added to GYN support group e-mail list   Follow Up Plan: Patient will contact CSW with any support or resource needs Patient  verbalizes understanding of plan: Yes    Jamarion J[place]onville E Al Bracewell, LCSW Clinical Social Worker [facility]

## 2024-04-26 NOTE — Assessment & Plan Note (Addendum)
 The patient developed acute allergic reaction to paclitaxel within minutes of infusion despite adequate premedications With additional premedications given, her symptoms subsequently resolved and her vital signs are stable She will resume the rest of her chemotherapy with carboplatin only and paclitaxel is added to her allergy list In the future, I plan to change her treatment to docetaxel and we will get new insurance authorization for that We will call the patient on Monday to check on her

## 2024-04-29 ENCOUNTER — Telehealth: Payer: Self-pay

## 2024-04-29 LAB — SURGICAL PATHOLOGY

## 2024-04-29 NOTE — Telephone Encounter (Signed)
 Called to check on her today. She is doing well with no complaints. She is eating and drinking with no problems. She will call the office for questions/concerns.

## 2024-04-29 NOTE — Telephone Encounter (Signed)
-----   Message from [name] sent at 04/29/2024  8:41 AM EST ----- She had allergic reaction to taxol on Friday Can you call and check on her today?

## 2024-05-01 ENCOUNTER — Ambulatory Visit

## 2024-05-01 DIAGNOSIS — I10 Essential (primary) hypertension: Secondary | ICD-10-CM | POA: Diagnosis not present

## 2024-05-01 NOTE — Progress Notes (Signed)
 Patient is in office today for a nurse visit for Blood Pressure Check. Patient blood pressure was 148/83, Patient No chest pain, No shortness of breath, No syncope

## 2024-05-02 ENCOUNTER — Encounter: Payer: Self-pay | Admitting: Hematology and Oncology

## 2024-05-03 ENCOUNTER — Telehealth: Payer: Self-pay

## 2024-05-03 NOTE — Telephone Encounter (Signed)
 Copied from CRM [id number]. Topic: Clinical - Medical Advice >> May 02, 2024  5:15 PM Sophia H wrote: Reason for CRM: Questions regarding cancer treatment/radiation for PCP, please reach out. # [phone]

## 2024-05-06 ENCOUNTER — Other Ambulatory Visit: Payer: Self-pay | Admitting: Nurse Practitioner

## 2024-05-06 DIAGNOSIS — R0602 Shortness of breath: Secondary | ICD-10-CM

## 2024-05-06 DIAGNOSIS — R600 Localized edema: Secondary | ICD-10-CM

## 2024-05-07 ENCOUNTER — Telehealth: Payer: Self-pay | Admitting: Oncology

## 2024-05-07 ENCOUNTER — Encounter: Payer: Self-pay | Admitting: Family Medicine

## 2024-05-07 DIAGNOSIS — Z23 Encounter for immunization: Secondary | ICD-10-CM | POA: Insufficient documentation

## 2024-05-07 NOTE — Progress Notes (Signed)
 Brandy Dean     MRN: [id number]      DOB: [birth year]  Chief Complaint  Patient presents with   Foot Swelling    Pt complains of bilateral foot swelling x2 weeks. Was seen on 10/28 by [facility]. Furosemide  not helping     HPI Brandy Dean is here for follow up and re-evaluation of chronic medical conditions, medication management and review of any available recent lab and radiology data.  She had surgery for endometrial cancer on 03/27/2024, hysterectomy and bilateral oalpingo oophorectomy C/o leg swelling and blood pressure medications have been changed Thankful that cancer was removed with no evidence of spread and reports good recovery from her surgery   ROS Denies recent fever or chills. Denies sinus pressure, nasal congestion, ear pain or sore throat. Denies chest congestion, productive cough or wheezing. Denies chest pains, palpitations and leg swelling Denies abdominal pain, nausea, vomiting,diarrhea or constipation.   Denies dysuria, frequency, hesitancy or incontinence. Denies uncontrolled  joint pain, swelling and limitation in mobility. Denies headaches, seizures, numbness, or tingling. Denies depression, anxiety or insomnia. Denies skin break down or rash.   PE  BP (!) 155/85   Pulse (!) 56   Resp 18   Ht 5' 4 (1.626 m)   Wt 249 lb (112.9 kg)   SpO2 90%   BMI 42.74 kg/m   Patient alert and oriented and in no cardiopulmonary distress.  HEENT: No facial asymmetry, EOMI,     Neck supple .  Chest: Clear to auscultation bilaterally.  CVS: S1, S2 no murmurs, no S3.Regular rate.  ABD: Soft non tender.   Ext: one plus  edema  MS: Adequate ROM spine, shoulders, hips and knees.  Skin: Intact, no ulcerations or rash noted.  Psych: Good eye contact, normal affect. Memory intact not anxious or depressed appearing.  CNS: CN 2-12 intact, power,  normal throughout.no focal deficits noted.   Assessment & Plan  Essential hypertension Uncontrolled, resume  triamterene  as before, Nurse BP re eval in 2 weeks DASH diet and commitment to daily physical activity for a minimum of 30 minutes discussed and encouraged, as a part of hypertension management. The importance of attaining a healthy weight is also discussed.     05/01/2024    3:03 PM 04/26/2024    2:05 PM 04/26/2024    1:10 PM 04/26/2024   11:51 AM 04/26/2024   11:48 AM 04/26/2024   11:44 AM 04/26/2024   11:28 AM  BP/Weight  Systolic BP 148 139 116 147 162 140 105  Diastolic BP 83 97 59 116 97 85 81  Wt. (Lbs) 227.12        BMI 38.99 kg/m2             Obesity, Class III, BMI 40-49.9 (morbid obesity) (HCC)  Patient re-educated about  the importance of commitment to a  minimum of 150 minutes of exercise per week as able.  The importance of healthy food choices with portion control discussed, as well as eating regularly and within a 12 hour window most days. The need to choose clean , green food 50 to 75% of the time is discussed, as well as to make water  the primary drink and set a goal of 64 ounces water  daily.       05/01/2024    3:03 PM 04/26/2024    9:08 AM 04/25/2024    8:46 AM  Weight /BMI  Weight 227 lb 1.9 oz 237 lb 8 oz 238 lb  Height  5' 4 (1.626 m)   BMI 38.99 kg/m2 40.77 kg/m2 40.85 kg/m2      Asthma, cough variant Controlled, no change in medication Controlled, no change in medication   Influenza vaccination administered at current visit After obtaining informed consent, the vaccine is  administered , with no adverse effect noted at the time of administration.   Endometrial cancer (HCC) S/P TAH and BSO 03/27/2024, upcoming oncology consultation for any additional treatment chemo, radiation , both or neither

## 2024-05-07 NOTE — Assessment & Plan Note (Signed)
 S/P TAH and BSO 03/27/2024, upcoming oncology consultation for any additional treatment chemo, radiation , both or neither

## 2024-05-07 NOTE — Telephone Encounter (Signed)
Pt informed

## 2024-05-07 NOTE — Telephone Encounter (Signed)
 Called Brandy Dean regarding her questions about treatment.  She said her children are asking why she needs chemotherapy and radiation if all the cancer was removed with surgery. She understands that her cancer is aggressive (grade 3) from her discussions with Dr. Viktoria and Dr. Lonn and that is why she need further treatment.  She does not want it to come back. She also understands that vaginal brachytherapy will help prevent recurrences of the cancer at the vaginal cuff area.  She wants to proceed with treatment.  Advised her that her children can call if they have questions or concerns.  Gave her my contact information.

## 2024-05-07 NOTE — Assessment & Plan Note (Signed)
 After obtaining informed consent, the vaccine is  administered , with no adverse effect noted at the time of administration.

## 2024-05-07 NOTE — Assessment & Plan Note (Signed)
 Uncontrolled, resume triamterene  as before, Nurse BP re eval in 2 weeks DASH diet and commitment to daily physical activity for a minimum of 30 minutes discussed and encouraged, as a part of hypertension management. The importance of attaining a healthy weight is also discussed.     05/01/2024    3:03 PM 04/26/2024    2:05 PM 04/26/2024    1:10 PM 04/26/2024   11:51 AM 04/26/2024   11:48 AM 04/26/2024   11:44 AM 04/26/2024   11:28 AM  BP/Weight  Systolic BP 148 139 116 147 162 140 105  Diastolic BP 83 97 59 116 97 85 81  Wt. (Lbs) 227.12        BMI 38.99 kg/m2

## 2024-05-07 NOTE — Assessment & Plan Note (Addendum)
 Controlled, no change in medication Controlled, no change in medication

## 2024-05-07 NOTE — Assessment & Plan Note (Signed)
  Patient re-educated about  the importance of commitment to a  minimum of 150 minutes of exercise per week as able.  The importance of healthy food choices with portion control discussed, as well as eating regularly and within a 12 hour window most days. The need to choose clean , green food 50 to 75% of the time is discussed, as well as to make water  the primary drink and set a goal of 64 ounces water  daily.       05/01/2024    3:03 PM 04/26/2024    9:08 AM 04/25/2024    8:46 AM  Weight /BMI  Weight 227 lb 1.9 oz 237 lb 8 oz 238 lb  Height  5' 4 (1.626 m)   BMI 38.99 kg/m2 40.77 kg/m2 40.85 kg/m2

## 2024-05-10 NOTE — Progress Notes (Signed)
 Pharmacist Chemotherapy Monitoring - Initial Assessment    Anticipated start date: 05/17/2024    The following has been reviewed per standard work regarding the patient's treatment regimen: The patient's diagnosis, treatment plan and drug doses, and organ/hematologic function Lab orders and baseline tests specific to treatment regimen  The treatment plan start date, drug sequencing, and pre-medications Prior authorization status  Patient's documented medication list, including drug-drug interaction screen and prescriptions for anti-emetics and supportive care specific to the treatment regimen The drug concentrations, fluid compatibility, administration routes, and timing of the medications to be used The patient's access for treatment and lifetime cumulative dose history, if applicable  The patient's medication allergies and previous infusion related reactions, if applicable   Changes made to treatment plan:  N/A  Follow up needed:  N/A  FIRST TIME TAXOTERE.    Niels FORBES Molt, [facility], 05/10/2024  9:53 AM

## 2024-05-14 ENCOUNTER — Telehealth: Payer: Self-pay | Admitting: Oncology

## 2024-05-14 NOTE — Telephone Encounter (Signed)
 Emaline called and said her son is going to be coming to her infusion on 05/17/24.  She is wondering when she will be done with her infusion so that her son can go back to work.  Advised her that her infusion is scheduled for 3:15 hours so she should be finished by 1:30.

## 2024-05-17 ENCOUNTER — Inpatient Hospital Stay

## 2024-05-17 ENCOUNTER — Inpatient Hospital Stay: Admitting: Hematology and Oncology

## 2024-05-17 ENCOUNTER — Telehealth: Payer: Self-pay | Admitting: Oncology

## 2024-05-17 ENCOUNTER — Inpatient Hospital Stay: Attending: Gynecologic Oncology

## 2024-05-17 ENCOUNTER — Encounter: Payer: Self-pay | Admitting: Hematology and Oncology

## 2024-05-17 VITALS — BP 135/69 | HR 54 | Temp 97.8°F | Resp 18 | Wt 233.8 lb

## 2024-05-17 DIAGNOSIS — Z79630 Long term (current) use of alkylating agent: Secondary | ICD-10-CM | POA: Insufficient documentation

## 2024-05-17 DIAGNOSIS — Z5111 Encounter for antineoplastic chemotherapy: Secondary | ICD-10-CM | POA: Insufficient documentation

## 2024-05-17 DIAGNOSIS — C541 Malignant neoplasm of endometrium: Secondary | ICD-10-CM

## 2024-05-17 LAB — CMP (CANCER CENTER ONLY)
ALT: 9 U/L (ref 0–44)
AST: 17 U/L (ref 15–41)
Albumin: 4.5 g/dL (ref 3.5–5.0)
Alkaline Phosphatase: 72 U/L (ref 38–126)
Anion gap: 10 (ref 5–15)
BUN: 17 mg/dL (ref 8–23)
CO2: 28 mmol/L (ref 22–32)
Calcium: 11 mg/dL — ABNORMAL HIGH (ref 8.9–10.3)
Chloride: 99 mmol/L (ref 98–111)
Creatinine: 0.76 mg/dL (ref 0.44–1.00)
GFR, Estimated: >60 mL/min (ref >60)
Glucose, Bld: 111 mg/dL — ABNORMAL HIGH (ref 70–99)
Potassium: 4.1 mmol/L (ref 3.5–5.1)
Sodium: 137 mmol/L (ref 135–145)
Total Bilirubin: 0.5 mg/dL (ref 0.0–1.2)
Total Protein: 8 g/dL (ref 6.5–8.1)

## 2024-05-17 LAB — CBC WITH DIFFERENTIAL (CANCER CENTER ONLY)
Abs Immature Granulocytes: 0.01 K/uL (ref 0.00–0.07)
Basophils Absolute: 0 K/uL (ref 0.0–0.1)
Basophils Relative: 0 %
Eosinophils Absolute: 0 K/uL (ref 0.0–0.5)
Eosinophils Relative: 0 %
HCT: 38.9 % (ref 36.0–46.0)
Hemoglobin: 12.7 g/dL (ref 12.0–15.0)
Immature Granulocytes: 0 %
Lymphocytes Relative: 14 %
Lymphs Abs: 0.6 K/uL — ABNORMAL LOW (ref 0.7–4.0)
MCH: 26.7 pg (ref 26.0–34.0)
MCHC: 32.6 g/dL (ref 30.0–36.0)
MCV: 81.7 fL (ref 80.0–100.0)
Monocytes Absolute: 0.1 K/uL (ref 0.1–1.0)
Monocytes Relative: 1 %
Neutro Abs: 3.8 K/uL (ref 1.7–7.7)
Neutrophils Relative %: 85 %
Platelet Count: 248 K/uL (ref 150–400)
RBC: 4.76 MIL/uL (ref 3.87–5.11)
RDW: 14.7 % (ref 11.5–15.5)
WBC Count: 4.5 K/uL (ref 4.0–10.5)
nRBC: 0 % (ref 0.0–0.2)

## 2024-05-17 MED ORDER — FAMOTIDINE IN NACL 20-0.9 MG/50ML-% IV SOLN
20.0000 mg | Freq: Once | INTRAVENOUS | Status: AC
  Administered 2024-05-17: 20 mg via INTRAVENOUS
  Filled 2024-05-17: qty 50

## 2024-05-17 MED ORDER — DEXAMETHASONE SOD PHOSPHATE PF 10 MG/ML IJ SOLN
10.0000 mg | Freq: Once | INTRAMUSCULAR | Status: AC
  Administered 2024-05-17: 10 mg via INTRAVENOUS

## 2024-05-17 MED ORDER — SODIUM CHLORIDE 0.9 % IV SOLN: INTRAVENOUS | Status: DC

## 2024-05-17 MED ORDER — PALONOSETRON HCL INJECTION 0.25 MG/5ML
0.2500 mg | Freq: Once | INTRAVENOUS | Status: AC
  Administered 2024-05-17: 0.25 mg via INTRAVENOUS
  Filled 2024-05-17: qty 5

## 2024-05-17 MED ORDER — SODIUM CHLORIDE 0.9 % IV SOLN
50.0000 mg/m2 | Freq: Once | INTRAVENOUS | Status: AC
  Administered 2024-05-17: 93 mg via INTRAVENOUS
  Filled 2024-05-17: qty 9.3

## 2024-05-17 MED ORDER — DIPHENHYDRAMINE HCL 50 MG/ML IJ SOLN
25.0000 mg | Freq: Once | INTRAMUSCULAR | Status: AC
  Administered 2024-05-17: 25 mg via INTRAVENOUS
  Filled 2024-05-17: qty 1

## 2024-05-17 MED ORDER — APREPITANT 130 MG/18ML IV EMUL
130.0000 mg | Freq: Once | INTRAVENOUS | Status: AC
  Administered 2024-05-17: 130 mg via INTRAVENOUS
  Filled 2024-05-17: qty 18

## 2024-05-17 MED ORDER — SODIUM CHLORIDE 0.9 % IV SOLN
521.4000 mg | Freq: Once | INTRAVENOUS | Status: AC
  Administered 2024-05-17: 520 mg via INTRAVENOUS
  Filled 2024-05-17: qty 52

## 2024-05-17 NOTE — Patient Instructions (Signed)
 [facility] - A DEPT OF [place]  Discharge Instructions: Thank you for choosing [facility] to provide your oncology and hematology care.   If you have a lab appointment with the Cancer Center, please go directly to the Cancer Center and check in at the registration area.   Wear comfortable clothing and clothing appropriate for easy access to any Portacath or PICC line.   We strive to give you quality time with your provider. You may need to reschedule your appointment if you arrive late (15 or more minutes).  Arriving late affects you and other patients whose appointments are after yours.  Also, if you miss three or more appointments without notifying the office, you may be dismissed from the clinic at the provider's discretion.      For prescription refill requests, have your pharmacy contact our office and allow 72 hours for refills to be completed.    Today you received the following chemotherapy and/or immunotherapy agents Docetaxel  (taxotere ), Carboplatin  (paraplatin )      To help prevent nausea and vomiting after your treatment, we encourage you to take your nausea medication as directed.  BELOW ARE SYMPTOMS THAT SHOULD BE REPORTED IMMEDIATELY: *FEVER GREATER THAN 100.4 F (38 C) OR HIGHER *CHILLS OR SWEATING *NAUSEA AND VOMITING THAT IS NOT CONTROLLED WITH YOUR NAUSEA MEDICATION *UNUSUAL SHORTNESS OF BREATH *UNUSUAL BRUISING OR BLEEDING *URINARY PROBLEMS (pain or burning when urinating, or frequent urination) *BOWEL PROBLEMS (unusual diarrhea, constipation, pain near the anus) TENDERNESS IN MOUTH AND THROAT WITH OR WITHOUT PRESENCE OF ULCERS (sore throat, sores in mouth, or a toothache) UNUSUAL RASH, SWELLING OR PAIN  UNUSUAL VAGINAL DISCHARGE OR ITCHING   Items with * indicate a potential emergency and should be followed up as soon as possible or go to the Emergency Department if any problems should occur.  Please show the  CHEMOTHERAPY ALERT CARD or IMMUNOTHERAPY ALERT CARD at check-in to the Emergency Department and triage nurse.  Should you have questions after your visit or need to cancel or reschedule your appointment, please contact [facility]  Dept: [phone]  and follow the prompts.  Office hours are 8:00 a.m. to 4:30 p.m. Monday - Friday. Please note that voicemails left after 4:00 p.m. may not be returned until the following business day.  We are closed weekends and major holidays. You have access to a nurse at all times for urgent questions. Please call the main number to the clinic Dept: [phone] and follow the prompts.   For any non-urgent questions, you may also contact your provider using MyChart. We now offer e-Visits for anyone 59 and older to request care online for non-urgent symptoms. For details visit [email or web address].   Also download the MyChart app! Go to the app store, search MyChart, open the app, select [place], and log in with your MyChart username and password.

## 2024-05-17 NOTE — Progress Notes (Signed)
 [facility] OFFICE PROGRESS NOTE  Patient Care Team: Brandy Rollene BRAVO, MD as PCP - General Brandy Jayson MATSU, MD as PCP - Cardiology (Cardiology)  Assessment & Plan Endometrial cancer [facility]) The patient was diagnosed with uterine cancer after presentation with postmenopausal bleeding, leading to imaging study and subsequent surgery in October 2025 Final pathology: High-grade endometrioid adenocarcinoma, FIGO grade 3, p53 mutated, overall stage IIc   Treatment cycle 1 was complicated by allergic reaction to paclitaxel  Her treatment is now substituted with docetaxel  The plan would be 6 cycles of chemotherapy She will also get radiation started in a few weeks We will proceed with treatment without delay    No orders of the defined types were placed in this encounter.    Brandy Bedford, MD  INTERVAL HISTORY: she returns for treatment follow-up Complications related to previous cycle of chemotherapy included allergic reaction to paclitaxel   PHYSICAL EXAMINATION: ECOG PERFORMANCE STATUS: 0 - Asymptomatic  Lab Results  Component Value Date   CAN125 12.1 03/15/2024      Latest Ref Rng & Units 05/17/2024    9:23 AM 04/25/2024    9:04 AM 03/28/2024    6:46 AM  CBC  WBC 4.0 - 10.5 K/uL 4.5  6.6  11.4   Hemoglobin 12.0 - 15.0 g/dL 87.2  87.2  87.8   Hematocrit 36.0 - 46.0 % 38.9  38.5  38.6   Platelets 150 - 400 K/uL 248  230  194       Chemistry      Component Value Date/Time   NA 137 05/17/2024 0923   NA 137 02/16/2024 1613   K 4.1 05/17/2024 0923   CL 99 05/17/2024 0923   CO2 28 05/17/2024 0923   BUN 17 05/17/2024 0923   BUN 18 02/16/2024 1613   CREATININE 0.76 05/17/2024 0923   CREATININE 0.81 11/20/2019 1457      Component Value Date/Time   CALCIUM  11.0 (H) 05/17/2024 0923   ALKPHOS 72 05/17/2024 0923   AST 17 05/17/2024 0923   ALT 9 05/17/2024 0923   BILITOT 0.5 05/17/2024 0923       There were no vitals filed for this visit. There were no  vitals filed for this visit. Other relevant data reviewed during this visit included CBC and CMP

## 2024-05-17 NOTE — Telephone Encounter (Signed)
 Called [facility] Radiation Oncology and they are not able to provide vaginal brachytherapy treatments.  They do not have the equipment.    Called [place] and let her know.  She is ok with coming to the [facility] for her radiation treatments.

## 2024-05-17 NOTE — Assessment & Plan Note (Addendum)
 The patient was diagnosed with uterine cancer after presentation with postmenopausal bleeding, leading to imaging study and subsequent surgery in October 2025 Final pathology: High-grade endometrioid adenocarcinoma, FIGO grade 3, p53 mutated, overall stage IIc   Treatment cycle 1 was complicated by allergic reaction to paclitaxel  Her treatment is now substituted with docetaxel  The plan would be 6 cycles of chemotherapy She will also get radiation started in a few weeks We will proceed with treatment without delay

## 2024-05-20 ENCOUNTER — Encounter: Payer: Self-pay | Admitting: Hematology and Oncology

## 2024-05-20 ENCOUNTER — Telehealth: Payer: Self-pay

## 2024-05-20 NOTE — Telephone Encounter (Signed)
-----   Message from Nurse Deland RAMAN sent at 05/17/2024  3:38 PM EST ----- Regarding: 1st time Pt received Toxotere and Carboplatin  for the 1st time and tolerated treatment well. She is a patient of Dr lonn . Ambulated from infusion area with walker and no complaints

## 2024-05-24 ENCOUNTER — Telehealth: Payer: Self-pay

## 2024-05-24 NOTE — Telephone Encounter (Signed)
 Spoke with the pt regarding her disability forms Being  completed, faxed, and confirmation received. Pt verbalized understanding. Patient request for her copy to be mailed to her home address.

## 2024-05-27 ENCOUNTER — Encounter: Payer: Self-pay | Admitting: Hematology and Oncology

## 2024-05-27 ENCOUNTER — Telehealth: Payer: Self-pay | Admitting: *Deleted

## 2024-05-27 NOTE — Progress Notes (Signed)
 Brandy Dean is here today for follow up new vaginal cylinder fitting.     Does the patient complain of any of the following:  Pain:None Abdominal bloating: None Diarrhea/Constipation: She reports some constipation in the last week, has been taking miralax . Nausea/Vomiting: She reports some nausea the last couple of weeks, taking antiemetics. Vaginal Discharge: none Blood in Urine or Stool: None Urinary Issues (dysuria/incomplete emptying/ incontinence/ increased frequency/urgency): None    Additional comments if applicable:

## 2024-05-27 NOTE — Telephone Encounter (Signed)
 CALLED PATIENT TO REMIND OF NEW HDR [facility] FOR 05-28-24, SPOKE WITH PATIENT AND SHE IS AWARE OF THESE APPTS.

## 2024-05-28 ENCOUNTER — Ambulatory Visit
Admission: RE | Admit: 2024-05-28 | Discharge: 2024-05-28 | Attending: Radiation Oncology | Admitting: Radiation Oncology

## 2024-05-28 ENCOUNTER — Ambulatory Visit
Admission: RE | Admit: 2024-05-28 | Discharge: 2024-05-28 | Disposition: A | Discharge status: Home / Self Care | Source: Ambulatory Visit | Attending: Radiation Oncology | Admitting: Radiation Oncology

## 2024-05-28 ENCOUNTER — Encounter: Payer: Self-pay | Admitting: Radiation Oncology

## 2024-05-28 ENCOUNTER — Ambulatory Visit
Admission: RE | Admit: 2024-05-28 | Discharge: 2024-05-28 | Disposition: A | Source: Ambulatory Visit | Attending: Radiation Oncology | Admitting: Radiation Oncology

## 2024-05-28 ENCOUNTER — Other Ambulatory Visit: Payer: Self-pay

## 2024-05-28 VITALS — BP 151/87 | HR 67 | Temp 96.8°F | Resp 18 | Ht 64.0 in | Wt 228.1 lb

## 2024-05-28 DIAGNOSIS — C541 Malignant neoplasm of endometrium: Secondary | ICD-10-CM

## 2024-05-28 DIAGNOSIS — Z79899 Other long term (current) drug therapy: Secondary | ICD-10-CM | POA: Insufficient documentation

## 2024-05-28 LAB — RAD ONC ARIA SESSION SUMMARY
Course Elapsed Days: 0
Plan Fractions Treated to Date: 1
Plan Prescribed Dose Per Fraction: 6 Gy
Plan Total Fractions Prescribed: 5
Plan Total Prescribed Dose: 30 Gy
Reference Point Dosage Given to Date: 6 Gy
Reference Point Session Dosage Given: 6 Gy
Session Number: 1

## 2024-05-28 NOTE — Progress Notes (Signed)
°  Radiation Oncology         [phone] ________________________________  Name: Brandy Dean MRN: [id number]  Date: 05/28/2024  DOB: Dec 17, 1950  CC: Brandy Rollene BRAVO, MD  Brandy Rollene BRAVO, MD  HDR BRACHYTHERAPY NOTE  DIAGNOSIS: The encounter diagnosis was Endometrial cancer [facility]).   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 She will be treated with a 2.5 cm diameter segmented cylinder. This conforms to her anatomy without undue discomfort.  Vaginal brachytherapy procedure node: The patient was brought to the HDR suite. Identity was confirmed. All relevant records and images related to the planned course of therapy were reviewed. The patient freely provided informed written consent to proceed with treatment after reviewing the details related to the planned course of therapy. The consent form was witnessed and verified by the simulation staff. Then, the patient was set-up in a stable reproducible supine position for radiation therapy. Pelvic exam revealed the vaginal cuff to be intact . The patient's custom vaginal cylinder was placed in the proximal vagina. This was affixed to the CT/MR stabilization plate to prevent slippage. Patient tolerated the placement well.  Verification simulation note:  A fiducial marker was placed within the vaginal cylinder. An AP and lateral film was then obtained through the pelvis area. This documented accurate position of the vaginal cylinder for treatment.  HDR BRACHYTHERAPY TREATMENT  The remote afterloading device was affixed to the vaginal cylinder by catheter. Patient then proceeded to undergo her first high-dose-rate treatment directed at the proximal vagina. The patient was prescribed a dose of 6.0 gray to be delivered to the mucosal surface. Treatment length was 2.5 cm. Patient was treated with 1 channel using 5 dwell positions. Treatment  time was 233.4 seconds. Iridium 192 was the high-dose-rate source for treatment. The patient tolerated the treatment well. After completion of her therapy, a radiation survey was performed documenting return of the iridium source into the [employer] safe.   PLAN: patient will return next week to undergo her second high-dose-rate treatment.  ________________________________  Lynwood CHARM Nasuti, PhD, MD   This document serves as a record of services personally performed by Lynwood Nasuti, MD. It was created on his behalf by Reymundo Cartwright, a trained medical scribe. The creation of this record is based on the scribe's personal observations and the provider's statements to them. This document has been checked and approved by the attending provider.

## 2024-05-28 NOTE — Progress Notes (Signed)
 Radiation Oncology         [phone] ________________________________  Name: Brandy Dean MRN: [id number]  Date: 05/28/2024  DOB: 1950-08-01  Vaginal Brachytherapy Procedure Note  CC: [name], MD [name], MD    ICD-10-CM   1. Endometrial cancer (HCC)  C54.1       Diagnosis: The encounter diagnosis was Endometrial cancer (HCC).   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 She was last seen in office on 04/18/24 for her initial consultation.   In the interval since she was last seen, she presented for a consultation visit with Dr. Lonn on 04/18/24 during which they opted to proceed with 6 cycles of intravenous treatment with carboplatin  & Taxol  every 3 weeks.   She was seen by Dr. Viktoria on 04/25/24 during which she reported feeling quite well without any abdominal bleeding or vaginal bleeding.  During her follow up with Dr. Lonn on 04/26/24, patient had an allergic reaction to paclitaxel  and therefore they will proceed with carboplatin  only. Dr. Lonn plans to change her treatment to docetaxel  later on.  No other significant oncologic interval history since the patient was last seen.   She denies any pelvic pain vaginal bleeding or discharge.     ALLERGIES: is allergic to benzonatate and paclitaxel .  Meds: Current Outpatient Medications  Medication Sig Dispense Refill   acetaminophen  (TYLENOL ) 500 MG tablet Take 500-1,000 mg by mouth every 6 (six) hours as needed for moderate pain (pain score 4-6).     Albuterol -Budesonide  (AIRSUPRA ) 90-80 MCG/ACT AERO Inhale 2 puffs into the lungs as needed. Inhale 2 puffs as needed for wheezing, maximum 12 puffs/24 hours 10.7 g 5   BREZTRI  AEROSPHERE 160-9-4.8 MCG/ACT AERO inhaler INHALE 2 PUFFS INTO THE LUNGS TWICE DAILY 10.7 g 11   Cholecalciferol  (VITAMIN D3 PO) Take 1 tablet by mouth daily.     clotrimazole -betamethasone  (LOTRISONE ) cream APPLY TOPICALLY TO THE AFFECTED AREA TWICE DAILY 45 g 0   ondansetron  (ZOFRAN ) 8 MG tablet Take 1 tablet (8 mg total) by mouth every 8 (eight) hours as needed for nausea or vomiting. Start on the third day after chemotherapy. 30 tablet 1   polyethylene glycol (MIRALAX ) 17 g packet Take 17 g by mouth daily. For AFTER surgery, do not take if having diarrhea 14 each 0   prochlorperazine  (COMPAZINE ) 10 MG tablet Take 1 tablet (10 mg total) by mouth every 6 (six) hours as needed for nausea or vomiting. 30 tablet 1   triamterene -hydrochlorothiazide (MAXZIDE) 75-50 MG tablet Take 1 tablet by mouth daily. 30 tablet 3   No current facility-administered medications for this encounter.    Physical Findings: The patient is in no acute distress. Patient is alert and oriented.  height is 5' 4 (1.626 m) and weight is 228 lb 2 oz (103.5 kg). Her temporal temperature is 96.8 F (36 C) (abnormal). Her blood pressure is 151/87 (abnormal) and her pulse is 67. Her respiration is 18 and oxygen  saturation is 98%.   No palpable cervical, supraclavicular or axillary lymphoadenopathy. The heart has a regular rate and rhythm. The lungs are clear to auscultation. Abdomen soft and non-tender.  On pelvic examination the external genitalia were unremarkable. A speculum exam was performed. Vaginal cuff intact, no mucosal lesions. On bimanual exam there were no pelvic masses appreciated.  Lab Findings: Lab Results  Component Value Date  WBC 4.5 05/17/2024   HGB 12.7 05/17/2024   HCT 38.9 05/17/2024   MCV 81.7 05/17/2024   PLT 248 05/17/2024    Radiographic Findings: No results found.  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 The patient will be treated with a 2.5 cm diameter cylinder with a treatment length of 2.5 cm. This distended the vaginal  vault without undue discomfort. The patient tolerated the procedure well.  The patient was successfully fitted for a vaginal cylinder. The patient is appropriate to begin vaginal brachytherapy.   Plan: The patient will proceed with CT simulation and vaginal brachytherapy today.    _______________________________   Lynwood CHARM Nasuti, PhD, MD  This document serves as a record of services personally performed by Lynwood Nasuti, MD. It was created on his behalf by Reymundo Cartwright, a trained medical scribe. The creation of this record is based on the scribe's personal observations and the provider's statements to them. This document has been checked and approved by the attending provider.

## 2024-05-31 ENCOUNTER — Telehealth: Payer: Self-pay | Admitting: *Deleted

## 2024-05-31 NOTE — Telephone Encounter (Signed)
 RETURNED PATIENT'S PHONE CALL, LVM FOR A RETURN CALL

## 2024-05-31 NOTE — Telephone Encounter (Signed)
 CALLED PATIENT TO REMIND OF HDR TX. FOR 06-03-24 @ 10 AM, LVM FOR A RETURN CALL

## 2024-06-02 NOTE — Progress Notes (Signed)
" °  Radiation Oncology         [phone] ________________________________  Name: Brandy Dean MRN: [id number]  Date: 06/03/2024  DOB: Oct 30, 1950  CC: [name], MD  [name], MD  HDR BRACHYTHERAPY NOTE  DIAGNOSIS: The encounter diagnosis was Endometrial cancer [facility]).   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 She will be treated with a 2.5 cm diameter segmented cylinder. This conforms to her anatomy without undue discomfort.  Vaginal brachytherapy procedure node: The patient was brought to the HDR suite. Identity was confirmed. All relevant records and images related to the planned course of therapy were reviewed. The patient freely provided informed written consent to proceed with treatment after reviewing the details related to the planned course of therapy. The consent form was witnessed and verified by the simulation staff. Then, the patient was set-up in a stable reproducible supine position for radiation therapy. Pelvic exam revealed the vaginal cuff to be intact . The patient's custom vaginal cylinder was placed in the proximal vagina. This was affixed to the CT/MR stabilization plate to prevent slippage. Patient tolerated the placement well.  Verification simulation note:  A fiducial marker was placed within the vaginal cylinder. An AP and lateral film was then obtained through the pelvis area. This documented accurate position of the vaginal cylinder for treatment.  HDR BRACHYTHERAPY TREATMENT  The remote afterloading device was affixed to the vaginal cylinder by catheter. Patient then proceeded to undergo her second high-dose-rate treatment directed at the proximal vagina. The patient was prescribed a dose of 6.0 gray to be delivered to the mucosal surface. Treatment length was 2.5 cm. Patient was treated with 1 channel using 5 dwell positions. Treatment  time was 247.0 seconds. Iridium 192 was the high-dose-rate source for treatment. The patient tolerated the treatment well. After completion of her therapy, a radiation survey was performed documenting return of the iridium source into the GammaMed safe.   PLAN: The patient will return next week to undergo her third high-dose-rate treatment.  ________________________________  -----------------------------------  Lynwood CHARM Nasuti, PhD, MD  This document serves as a record of services personally performed by Lynwood Nasuti, MD. It was created on his behalf by Dorthy Fuse, a trained medical scribe. The creation of this record is based on the scribe's personal observations and the provider's statements to them. This document has been checked and approved by the attending provider.   "

## 2024-06-03 ENCOUNTER — Ambulatory Visit
Admission: RE | Admit: 2024-06-03 | Discharge: 2024-06-03 | Attending: Radiation Oncology | Admitting: Radiation Oncology

## 2024-06-03 ENCOUNTER — Other Ambulatory Visit: Payer: Self-pay

## 2024-06-03 DIAGNOSIS — C541 Malignant neoplasm of endometrium: Secondary | ICD-10-CM

## 2024-06-03 LAB — RAD ONC ARIA SESSION SUMMARY
Course Elapsed Days: 6
Plan Fractions Treated to Date: 2
Plan Prescribed Dose Per Fraction: 6 Gy
Plan Total Fractions Prescribed: 5
Plan Total Prescribed Dose: 30 Gy
Reference Point Dosage Given to Date: 12 Gy
Reference Point Session Dosage Given: 6 Gy
Session Number: 2

## 2024-06-05 ENCOUNTER — Telehealth: Payer: Self-pay | Admitting: *Deleted

## 2024-06-05 NOTE — Telephone Encounter (Signed)
 Called patient to remind of HDR Tx. for 06-10-24 @ 9 am, spoke with patient and she is aware of this tx.

## 2024-06-09 NOTE — Progress Notes (Signed)
" °  Radiation Oncology         [phone] ________________________________  Name: Brandy Dean MRN: [id number]  Date: 06/10/2024  DOB: 16-Oct-1950  CC: [name], MD  [name], MD  HDR BRACHYTHERAPY NOTE  DIAGNOSIS: The encounter diagnosis was Endometrial cancer [facility]).   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 She will be treated with a 2.5 cm diameter segmented cylinder. This conforms to her anatomy without undue discomfort.  Vaginal brachytherapy procedure node: The patient was brought to the HDR suite. Identity was confirmed. All relevant records and images related to the planned course of therapy were reviewed. The patient freely provided informed written consent to proceed with treatment after reviewing the details related to the planned course of therapy. The consent form was witnessed and verified by the simulation staff. Then, the patient was set-up in a stable reproducible supine position for radiation therapy. Pelvic exam revealed the vaginal cuff to be intact . The patient's custom vaginal cylinder was placed in the proximal vagina. This was affixed to the CT/MR stabilization plate to prevent slippage. Patient tolerated the placement well.  Verification simulation note:  A fiducial marker was placed within the vaginal cylinder. An AP and lateral film was then obtained through the pelvis area. This documented accurate position of the vaginal cylinder for treatment.  HDR BRACHYTHERAPY TREATMENT  The remote afterloading device was affixed to the vaginal cylinder by catheter. Patient then proceeded to undergo her third high-dose-rate treatment directed at the proximal vagina. The patient was prescribed a dose of 6.0 gray to be delivered to the mucosal surface. Treatment length was 2.5 cm. Patient was treated with 1 channel using 5 dwell positions. Treatment  time was 263.9 seconds. Iridium 192 was the high-dose-rate source for treatment. The patient tolerated the treatment well. After completion of her therapy, a radiation survey was performed documenting return of the iridium source into the GammaMed safe.   PLAN: The patient will return next week to undergo her fourth high-dose-rate treatment.  ________________________________  -----------------------------------  Lynwood CHARM Nasuti, PhD, MD  This document serves as a record of services personally performed by Lynwood Nasuti, MD. It was created on his behalf by Dorthy Fuse, a trained medical scribe. The creation of this record is based on the scribe's personal observations and the provider's statements to them. This document has been checked and approved by the attending provider.   "

## 2024-06-10 ENCOUNTER — Telehealth: Payer: Self-pay | Admitting: *Deleted

## 2024-06-10 ENCOUNTER — Ambulatory Visit: Admitting: Radiation Oncology

## 2024-06-10 ENCOUNTER — Ambulatory Visit
Admission: RE | Admit: 2024-06-10 | Discharge: 2024-06-10 | Disposition: A | Source: Ambulatory Visit | Attending: Radiation Oncology | Admitting: Radiation Oncology

## 2024-06-10 ENCOUNTER — Other Ambulatory Visit: Payer: Self-pay

## 2024-06-10 DIAGNOSIS — C541 Malignant neoplasm of endometrium: Secondary | ICD-10-CM

## 2024-06-10 LAB — RAD ONC ARIA SESSION SUMMARY
Course Elapsed Days: 13
Plan Fractions Treated to Date: 3
Plan Prescribed Dose Per Fraction: 6 Gy
Plan Total Fractions Prescribed: 5
Plan Total Prescribed Dose: 30 Gy
Reference Point Dosage Given to Date: 18 Gy
Reference Point Session Dosage Given: 6 Gy
Session Number: 3

## 2024-06-10 NOTE — Telephone Encounter (Signed)
 RETURNED PATIENT'S PHONE CALL, SPOKE WITH PATIENT. ?

## 2024-06-11 ENCOUNTER — Other Ambulatory Visit: Payer: Self-pay | Admitting: Hematology and Oncology

## 2024-06-12 ENCOUNTER — Ambulatory Visit: Admitting: Radiation Oncology

## 2024-06-17 ENCOUNTER — Inpatient Hospital Stay (HOSPITAL_BASED_OUTPATIENT_CLINIC_OR_DEPARTMENT_OTHER): Admitting: Hematology and Oncology

## 2024-06-17 ENCOUNTER — Inpatient Hospital Stay: Attending: Gynecologic Oncology

## 2024-06-17 ENCOUNTER — Encounter: Payer: Self-pay | Admitting: Hematology and Oncology

## 2024-06-17 ENCOUNTER — Ambulatory Visit: Admitting: Radiation Oncology

## 2024-06-17 ENCOUNTER — Inpatient Hospital Stay

## 2024-06-17 VITALS — BP 143/79 | HR 62 | Temp 98.9°F | Resp 18 | Ht 64.0 in | Wt 245.4 lb

## 2024-06-17 DIAGNOSIS — C541 Malignant neoplasm of endometrium: Secondary | ICD-10-CM

## 2024-06-17 DIAGNOSIS — Z79630 Long term (current) use of alkylating agent: Secondary | ICD-10-CM | POA: Insufficient documentation

## 2024-06-17 DIAGNOSIS — Z5111 Encounter for antineoplastic chemotherapy: Secondary | ICD-10-CM | POA: Diagnosis present

## 2024-06-17 DIAGNOSIS — Z79633 Long term (current) use of mitotic inhibitor: Secondary | ICD-10-CM | POA: Diagnosis not present

## 2024-06-17 LAB — CBC WITH DIFFERENTIAL (CANCER CENTER ONLY)
Abs Immature Granulocytes: 0.03 K/uL (ref 0.00–0.07)
Basophils Absolute: 0 K/uL (ref 0.0–0.1)
Basophils Relative: 0 %
Eosinophils Absolute: 0 K/uL (ref 0.0–0.5)
Eosinophils Relative: 0 %
HCT: 37.6 % (ref 36.0–46.0)
Hemoglobin: 12.3 g/dL (ref 12.0–15.0)
Immature Granulocytes: 1 %
Lymphocytes Relative: 11 %
Lymphs Abs: 0.6 K/uL — ABNORMAL LOW (ref 0.7–4.0)
MCH: 27 pg (ref 26.0–34.0)
MCHC: 32.7 g/dL (ref 30.0–36.0)
MCV: 82.5 fL (ref 80.0–100.0)
Monocytes Absolute: 0.1 K/uL (ref 0.1–1.0)
Monocytes Relative: 1 %
Neutro Abs: 4.7 K/uL (ref 1.7–7.7)
Neutrophils Relative %: 87 %
Platelet Count: 175 K/uL (ref 150–400)
RBC: 4.56 MIL/uL (ref 3.87–5.11)
RDW: 16.6 % — ABNORMAL HIGH (ref 11.5–15.5)
WBC Count: 5.4 K/uL (ref 4.0–10.5)
nRBC: 0 % (ref 0.0–0.2)

## 2024-06-17 LAB — CMP (CANCER CENTER ONLY)
ALT: 10 U/L (ref 0–44)
AST: 25 U/L (ref 15–41)
Albumin: 4.3 g/dL (ref 3.5–5.0)
Alkaline Phosphatase: 62 U/L (ref 38–126)
Anion gap: 13 (ref 5–15)
BUN: 22 mg/dL (ref 8–23)
CO2: 24 mmol/L (ref 22–32)
Calcium: 10.8 mg/dL — ABNORMAL HIGH (ref 8.9–10.3)
Chloride: 100 mmol/L (ref 98–111)
Creatinine: 0.77 mg/dL (ref 0.44–1.00)
GFR, Estimated: >60 mL/min
Glucose, Bld: 142 mg/dL — ABNORMAL HIGH (ref 70–99)
Potassium: 3.9 mmol/L (ref 3.5–5.1)
Sodium: 138 mmol/L (ref 135–145)
Total Bilirubin: 0.5 mg/dL (ref 0.0–1.2)
Total Protein: 7.5 g/dL (ref 6.5–8.1)

## 2024-06-17 MED ORDER — SODIUM CHLORIDE 0.9 % IV SOLN: INTRAVENOUS | Status: DC

## 2024-06-17 MED ORDER — SODIUM CHLORIDE 0.9 % IV SOLN
50.0000 mg/m2 | Freq: Once | INTRAVENOUS | Status: AC
  Administered 2024-06-17: 93 mg via INTRAVENOUS
  Filled 2024-06-17: qty 9.3

## 2024-06-17 MED ORDER — FAMOTIDINE IN NACL 20-0.9 MG/50ML-% IV SOLN
20.0000 mg | Freq: Once | INTRAVENOUS | Status: AC
  Administered 2024-06-17: 20 mg via INTRAVENOUS
  Filled 2024-06-17: qty 50

## 2024-06-17 MED ORDER — APREPITANT 130 MG/18ML IV EMUL
130.0000 mg | Freq: Once | INTRAVENOUS | Status: AC
  Administered 2024-06-17: 130 mg via INTRAVENOUS
  Filled 2024-06-17: qty 18

## 2024-06-17 MED ORDER — DEXAMETHASONE SOD PHOSPHATE PF 10 MG/ML IJ SOLN
10.0000 mg | Freq: Once | INTRAMUSCULAR | Status: AC
  Administered 2024-06-17: 10 mg via INTRAVENOUS

## 2024-06-17 MED ORDER — SODIUM CHLORIDE 0.9 % IV SOLN
521.4000 mg | Freq: Once | INTRAVENOUS | Status: AC
  Administered 2024-06-17: 520 mg via INTRAVENOUS
  Filled 2024-06-17: qty 52

## 2024-06-17 MED ORDER — PALONOSETRON HCL INJECTION 0.25 MG/5ML
0.2500 mg | Freq: Once | INTRAVENOUS | Status: AC
  Administered 2024-06-17: 0.25 mg via INTRAVENOUS
  Filled 2024-06-17: qty 5

## 2024-06-17 MED ORDER — DIPHENHYDRAMINE HCL 50 MG/ML IJ SOLN
25.0000 mg | Freq: Once | INTRAMUSCULAR | Status: AC
  Administered 2024-06-17: 25 mg via INTRAVENOUS
  Filled 2024-06-17: qty 1

## 2024-06-17 NOTE — Addendum Note (Signed)
 Addended by: Ralphael Southgate P on: 06/17/2024 08:32 AM   Modules accepted: Orders

## 2024-06-17 NOTE — Patient Instructions (Signed)
 [facility] - A DEPT OF [facility]  Discharge Instructions: Thank you for choosing [facility] to provide your oncology and hematology care.   If you have a lab appointment with the Cancer Center, please go directly to the Cancer Center and check in at the registration area.   Wear comfortable clothing and clothing appropriate for easy access to any Portacath or PICC line.   We strive to give you quality time with your provider. You may need to reschedule your appointment if you arrive late (15 or more minutes).  Arriving late affects you and other patients whose appointments are after yours.  Also, if you miss three or more appointments without notifying the office, you may be dismissed from the clinic at the provider's discretion.      For prescription refill requests, have your pharmacy contact our office and allow 72 hours for refills to be completed.    Today you received the following chemotherapy and/or immunotherapy agents: Docetaxel/Carboplatin      To help prevent nausea and vomiting after your treatment, we encourage you to take your nausea medication as directed.  BELOW ARE SYMPTOMS THAT SHOULD BE REPORTED IMMEDIATELY: *FEVER GREATER THAN 100.4 F (38 C) OR HIGHER *CHILLS OR SWEATING *NAUSEA AND VOMITING THAT IS NOT CONTROLLED WITH YOUR NAUSEA MEDICATION *UNUSUAL SHORTNESS OF BREATH *UNUSUAL BRUISING OR BLEEDING *URINARY PROBLEMS (pain or burning when urinating, or frequent urination) *BOWEL PROBLEMS (unusual diarrhea, constipation, pain near the anus) TENDERNESS IN MOUTH AND THROAT WITH OR WITHOUT PRESENCE OF ULCERS (sore throat, sores in mouth, or a toothache) UNUSUAL RASH, SWELLING OR PAIN  UNUSUAL VAGINAL DISCHARGE OR ITCHING   Items with * indicate a potential emergency and should be followed up as soon as possible or go to the Emergency Department if any problems should occur.  Please show the CHEMOTHERAPY ALERT CARD or  IMMUNOTHERAPY ALERT CARD at check-in to the Emergency Department and triage nurse.  Should you have questions after your visit or need to cancel or reschedule your appointment, please contact [facility] - A DEPT OF [facility]  Dept: [phone]  and follow the prompts.  Office hours are 8:00 a.m. to 4:30 p.m. Monday - Friday. Please note that voicemails left after 4:00 p.m. may not be returned until the following business day.  We are closed weekends and major holidays. You have access to a nurse at all times for urgent questions. Please call the main number to the clinic Dept: [phone] and follow the prompts.   For any non-urgent questions, you may also contact your provider using MyChart. We now offer e-Visits for anyone 60 and older to request care online for non-urgent symptoms. For details visit [email or web address].   Also download the MyChart app! Go to the app store, search "MyChart", open the app, select [facility], and log in with your MyChart username and password.

## 2024-06-17 NOTE — Assessment & Plan Note (Addendum)
 The patient was diagnosed with uterine cancer after presentation with postmenopausal bleeding, leading to imaging study and subsequent surgery in October 2025 Final pathology: High-grade endometrioid adenocarcinoma, FIGO grade 3, p53 mutated, overall stage IIc   Treatment cycle 1 was complicated by allergic reaction to paclitaxel  Her treatment is now substituted with docetaxel  The plan would be 6 cycles of chemotherapy However, for some unknown reason, the patient stated somebody told her she will only need 3 cycles of chemotherapy I discussed with the patient again and again that it would not be my recommendation to stop her chemotherapy prematurely The patient is adamant that she only needs 3 cycles of treatment I will reach out to her GYN surgeon to discuss this plan with her She will continue radiation treatment as scheduled

## 2024-06-17 NOTE — Progress Notes (Signed)
  Cancer Center OFFICE PROGRESS NOTE  Patient Care Team: [name], MD as PCP - General [name], MD as PCP - Cardiology (Cardiology)  Assessment & Plan Endometrial cancer [facility]) The patient was diagnosed with uterine cancer after presentation with postmenopausal bleeding, leading to imaging study and subsequent surgery in October 2025 Final pathology: High-grade endometrioid adenocarcinoma, FIGO grade 3, p53 mutated, overall stage IIc   Treatment cycle 1 was complicated by allergic reaction to paclitaxel  Her treatment is now substituted with docetaxel  The plan would be 6 cycles of chemotherapy However, for some unknown reason, the patient stated somebody told her she will only need 3 cycles of chemotherapy I discussed with the patient again and again that it would not be my recommendation to stop her chemotherapy prematurely The patient is adamant that she only needs 3 cycles of treatment I will reach out to her GYN surgeon to discuss this plan with her She will continue radiation treatment as scheduled  No orders of the defined types were placed in this encounter.    [name], MD  INTERVAL HISTORY: she returns for treatment follow-up Complications related to previous cycle of chemotherapy included elevated blood sugar The patient stated repeatedly that she is under the impression she only will need 3 cycles of treatment She tolerated last cycle therapy well No new side effects  PHYSICAL EXAMINATION: ECOG PERFORMANCE STATUS: 1 - Symptomatic but completely ambulatory  Lab Results  Component Value Date   CAN125 12.1 03/15/2024      Latest Ref Rng & Units 06/17/2024   10:18 AM 05/17/2024    9:23 AM 04/25/2024    9:04 AM  CBC  WBC 4.0 - 10.5 K/uL 5.4  4.5  6.6   Hemoglobin 12.0 - 15.0 g/dL 87.6  87.2  87.2   Hematocrit 36.0 - 46.0 % 37.6  38.9  38.5   Platelets 150 - 400 K/uL 175  248  230       Chemistry      Component Value Date/Time    NA 138 06/17/2024 1018   NA 137 02/16/2024 1613   K 3.9 06/17/2024 1018   CL 100 06/17/2024 1018   CO2 24 06/17/2024 1018   BUN 22 06/17/2024 1018   BUN 18 02/16/2024 1613   CREATININE 0.77 06/17/2024 1018   CREATININE 0.81 11/20/2019 1457      Component Value Date/Time   CALCIUM  10.8 (H) 06/17/2024 1018   ALKPHOS 62 06/17/2024 1018   AST 25 06/17/2024 1018   ALT 10 06/17/2024 1018   BILITOT 0.5 06/17/2024 1018       Vitals:   06/17/24 1047  BP: (!) 143/79  Pulse: 62  Resp: 18  Temp: 98.9 F (37.2 C)  SpO2: 94%   Filed Weights   06/17/24 1047  Weight: 245 lb 6.4 oz (111.3 kg)   Other relevant data reviewed during this visit included CBC and CMP

## 2024-06-18 ENCOUNTER — Encounter: Payer: Self-pay | Admitting: Hematology and Oncology

## 2024-06-18 ENCOUNTER — Telehealth: Payer: Self-pay

## 2024-06-18 ENCOUNTER — Telehealth: Payer: Self-pay | Admitting: *Deleted

## 2024-06-18 ENCOUNTER — Other Ambulatory Visit: Payer: Self-pay | Admitting: Hematology and Oncology

## 2024-06-18 NOTE — Telephone Encounter (Signed)
 CALLED PATIENT TO REMIND OF HDR TX, FOR 06-19-24 @ 2 PM, SPOKE WITH PATIENT AND SHE IS AWARE OF THIS TX.

## 2024-06-18 NOTE — Progress Notes (Signed)
" °  Radiation Oncology         [phone] ________________________________  Name: Brandy Dean MRN: [id number]  Date: 06/19/2024  DOB: August 03, 1950  CC: [name], MD  [name], MD  HDR BRACHYTHERAPY NOTE  DIAGNOSIS: The encounter diagnosis was Endometrial cancer [facility]).   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 She will be treated with a 2.5 cm diameter segmented cylinder. This conforms to her anatomy without undue discomfort.  Vaginal brachytherapy procedure node: The patient was brought to the HDR suite. Identity was confirmed. All relevant records and images related to the planned course of therapy were reviewed. The patient freely provided informed written consent to proceed with treatment after reviewing the details related to the planned course of therapy. The consent form was witnessed and verified by the simulation staff. Then, the patient was set-up in a stable reproducible supine position for radiation therapy. Pelvic exam revealed the vaginal cuff to be intact . The patient's custom vaginal cylinder was placed in the proximal vagina. This was affixed to the CT/MR stabilization plate to prevent slippage. Patient tolerated the placement well.  Verification simulation note:  A fiducial marker was placed within the vaginal cylinder. An AP and lateral film was then obtained through the pelvis area. This documented accurate position of the vaginal cylinder for treatment.  HDR BRACHYTHERAPY TREATMENT  The remote afterloading device was affixed to the vaginal cylinder by catheter. Patient then proceeded to undergo her fourth high-dose-rate treatment directed at the proximal vagina. The patient was prescribed a dose of 6.0 gray to be delivered to the mucosal surface. Treatment length was 2.5 cm. Patient was treated with 1 channel using 5 dwell positions. Treatment  time was 286.9 seconds. Iridium 192 was the high-dose-rate source for treatment. The patient tolerated the treatment well. After completion of her therapy, a radiation survey was performed documenting return of the iridium source into the GammaMed safe.   PLAN: The patient will return next week to undergo her fifth high-dose-rate treatment.  ________________________________  -----------------------------------  Lynwood CHARM Nasuti, PhD, MD  This document serves as a record of services personally performed by Lynwood Nasuti, MD. It was created on his behalf by Reymundo Cartwright, a trained medical scribe. The creation of this record is based on the scribe's personal observations and the provider's statements to them. This document has been checked and approved by the attending provider.    "

## 2024-06-18 NOTE — Telephone Encounter (Signed)
 Returned her call. She called apologizing for the misunderstanding about chemo appts. She does not need Dr. Viktoria to call her and understands that she does need 6 chemo appts.  She would like to schedule her next treatment.

## 2024-06-19 ENCOUNTER — Ambulatory Visit
Admission: RE | Admit: 2024-06-19 | Discharge: 2024-06-19 | Disposition: A | Discharge status: Home / Self Care | Source: Ambulatory Visit | Attending: Radiation Oncology | Admitting: Radiation Oncology

## 2024-06-19 ENCOUNTER — Other Ambulatory Visit: Payer: Self-pay

## 2024-06-19 DIAGNOSIS — C541 Malignant neoplasm of endometrium: Secondary | ICD-10-CM | POA: Diagnosis present

## 2024-06-19 LAB — RAD ONC ARIA SESSION SUMMARY
Course Elapsed Days: 22
Plan Fractions Treated to Date: 4
Plan Prescribed Dose Per Fraction: 6 Gy
Plan Total Fractions Prescribed: 5
Plan Total Prescribed Dose: 30 Gy
Reference Point Dosage Given to Date: 24 Gy
Reference Point Session Dosage Given: 6 Gy
Session Number: 4

## 2024-06-24 ENCOUNTER — Ambulatory Visit: Admitting: Radiation Oncology

## 2024-06-25 ENCOUNTER — Other Ambulatory Visit: Payer: Self-pay | Admitting: Family Medicine

## 2024-06-25 ENCOUNTER — Telehealth: Payer: Self-pay

## 2024-06-25 MED ORDER — TRIAMTERENE-HCTZ 75-50 MG PO TABS: 1.0000 | ORAL_TABLET | Freq: Every day | ORAL | 3 refills | Status: DC

## 2024-06-25 NOTE — Telephone Encounter (Signed)
 Copied from CRM [id number]. Topic: Clinical - Prescription Issue >> Jun 25, 2024  1:40 PM [name] wrote: Reason for CRM: Patient called stating that [facility] have refilled her medication for  triamterene -hydrochlorothiazide (MAXZIDE) 75-50 MG tablet for a 30 day supply  Needs refill with [facility]  [address] [place] [address] for a 90 day supply- patient is asking for all of her medication to be sent to and refilled by [facility] [employer]  Verified the patient's call back number as [phone]

## 2024-06-25 NOTE — Telephone Encounter (Signed)
 Copied from CRM [id number]. Topic: Clinical - Prescription Issue >> Jun 25, 2024  1:27 PM Alfonso HERO wrote: Reason for CRM: patient calling because pharmacy only gave her a 30 day supply for triamterene -hydrochlorothiazide (MAXZIDE) 75-50 MG tablet and its supposed to be a 90 day supply. She is asking if something can be done to fix this.

## 2024-06-25 NOTE — Telephone Encounter (Signed)
 Copied from CRM [id number]. Topic: Clinical - Medication Refill >> Jun 25, 2024 11:17 AM Darshell M wrote: Medication: triamterene -hydrochlorothiazide (MAXZIDE) 75-50 MG tablet  Has the patient contacted their pharmacy? Yes (Agent: If no, request that the patient contact the pharmacy for the refill. If patient does not wish to contact the pharmacy document the reason why and proceed with request.) (Agent: If yes, when and what did the pharmacy advise?)  This is the patient's preferred pharmacy:  [facility] - [place], [place] - [address] [place] [address] Phone: [phone] Fax: [phone]  Is this the correct pharmacy for this prescription? Yes If no, delete pharmacy and type the correct one.   Has the prescription been filled recently? No  Is the patient out of the medication? Yes - Been without medication for 5 days  Has the patient been seen for an appointment in the last year OR does the patient have an upcoming appointment? Yes  Can we respond through MyChart? No  Agent: Please be advised that Rx refills may take up to 3 business days. We ask that you follow-up with your pharmacy.

## 2024-06-26 ENCOUNTER — Telehealth: Payer: Self-pay | Admitting: *Deleted

## 2024-06-26 ENCOUNTER — Other Ambulatory Visit: Payer: Self-pay

## 2024-06-26 MED ORDER — TRIAMTERENE-HCTZ 75-50 MG PO TABS: 1.0000 | ORAL_TABLET | Freq: Every day | ORAL | 1 refills | Status: AC

## 2024-06-26 NOTE — Telephone Encounter (Signed)
 CALLED PATIENT TO REMIND OF HDR TX. FOR 06-27-24 @ 2 PM, SPOKE WITH PATIENT AND SHE IS AWARE OF THIS APPT.

## 2024-06-26 NOTE — Telephone Encounter (Signed)
Rx resent for 90 day supply.

## 2024-06-26 NOTE — Progress Notes (Signed)
" °  Radiation Oncology         [phone] ________________________________  Name: Brandy Dean MRN: [id number]  Date: 06/27/2024  DOB: 07-22-1950  CC: [name], MD  [name], MD  HDR BRACHYTHERAPY NOTE  DIAGNOSIS: The encounter diagnosis was Endometrial cancer [facility]).   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 She will be treated with a 2.5 cm diameter segmented cylinder. This conforms to her anatomy without undue discomfort.  Vaginal brachytherapy procedure node: The patient was brought to the HDR suite. Identity was confirmed. All relevant records and images related to the planned course of therapy were reviewed. The patient freely provided informed written consent to proceed with treatment after reviewing the details related to the planned course of therapy. The consent form was witnessed and verified by the simulation staff. Then, the patient was set-up in a stable reproducible supine position for radiation therapy. Pelvic exam revealed the vaginal cuff to be intact . The patient's custom vaginal cylinder was placed in the proximal vagina. This was affixed to the CT/MR stabilization plate to prevent slippage. Patient tolerated the placement well.  Verification simulation note:  A fiducial marker was placed within the vaginal cylinder. An AP and lateral film was then obtained through the pelvis area. This documented accurate position of the vaginal cylinder for treatment.  HDR BRACHYTHERAPY TREATMENT  The remote afterloading device was affixed to the vaginal cylinder by catheter. Patient then proceeded to undergo her fifth high-dose-rate treatment directed at the proximal vagina. The patient was prescribed a dose of 6.0 gray to be delivered to the mucosal surface. Treatment length was 2.5 cm. Patient was treated with 1 channel using 5 dwell positions. Treatment  time was 122.2 seconds. Iridium 192 was the high-dose-rate source for treatment. The patient tolerated the treatment well. After completion of her therapy, a radiation survey was performed documenting return of the iridium source into the [employer] safe.   PLAN:The patient completed her fifth and last high-dose-rate treatment, she will return next month for a follow up visit.    -----------------------------------  Lynwood CHARM Nasuti, PhD, MD  This document serves as a record of services personally performed by Lynwood Nasuti, MD. It was created on his behalf by Reymundo Cartwright, a trained medical scribe. The creation of this record is based on the scribe's personal observations and the provider's statements to them. This document has been checked and approved by the attending provider.  "

## 2024-06-27 ENCOUNTER — Ambulatory Visit
Admission: RE | Admit: 2024-06-27 | Discharge: 2024-06-27 | Disposition: A | Source: Ambulatory Visit | Attending: Radiation Oncology | Admitting: Radiation Oncology

## 2024-06-27 ENCOUNTER — Other Ambulatory Visit: Payer: Self-pay

## 2024-06-27 DIAGNOSIS — C541 Malignant neoplasm of endometrium: Secondary | ICD-10-CM | POA: Diagnosis not present

## 2024-06-27 LAB — RAD ONC ARIA SESSION SUMMARY
Course Elapsed Days: 30
Plan Fractions Treated to Date: 5
Plan Prescribed Dose Per Fraction: 6 Gy
Plan Total Fractions Prescribed: 5
Plan Total Prescribed Dose: 30 Gy
Reference Point Dosage Given to Date: 30 Gy
Reference Point Session Dosage Given: 6 Gy
Session Number: 5

## 2024-06-28 NOTE — Radiation Completion Notes (Addendum)
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   The patient will return in one month and will continue follow up with Drs. Lonn and Quinlan as well.      Ronita Due, PA-C "

## 2024-07-08 ENCOUNTER — Telehealth: Payer: Self-pay | Admitting: Hematology and Oncology

## 2024-07-08 NOTE — Telephone Encounter (Signed)
 Rescheduled appointments due to the cancer center not opening up until 10am. Left Vm with the changes made to the patients upcoming appointments. Patient will be sent a mychart message as well.

## 2024-07-09 ENCOUNTER — Inpatient Hospital Stay

## 2024-07-09 ENCOUNTER — Encounter: Payer: Self-pay | Admitting: Hematology and Oncology

## 2024-07-09 ENCOUNTER — Inpatient Hospital Stay (HOSPITAL_BASED_OUTPATIENT_CLINIC_OR_DEPARTMENT_OTHER): Admitting: Hematology and Oncology

## 2024-07-09 ENCOUNTER — Inpatient Hospital Stay: Admitting: Hematology and Oncology

## 2024-07-09 VITALS — BP 168/85 | HR 62 | Resp 18 | Ht 64.0 in | Wt 236.2 lb

## 2024-07-09 VITALS — BP 127/58

## 2024-07-09 DIAGNOSIS — Z5111 Encounter for antineoplastic chemotherapy: Secondary | ICD-10-CM | POA: Diagnosis not present

## 2024-07-09 DIAGNOSIS — C541 Malignant neoplasm of endometrium: Secondary | ICD-10-CM

## 2024-07-09 LAB — CBC WITH DIFFERENTIAL (CANCER CENTER ONLY)
Abs Immature Granulocytes: 0.02 10*3/uL (ref 0.00–0.07)
Basophils Absolute: 0 10*3/uL (ref 0.0–0.1)
Basophils Relative: 0 %
Eosinophils Absolute: 0 10*3/uL (ref 0.0–0.5)
Eosinophils Relative: 0 %
HCT: 38.2 % (ref 36.0–46.0)
Hemoglobin: 12.9 g/dL (ref 12.0–15.0)
Immature Granulocytes: 1 %
Lymphocytes Relative: 15 %
Lymphs Abs: 0.7 10*3/uL (ref 0.7–4.0)
MCH: 27.7 pg (ref 26.0–34.0)
MCHC: 33.8 g/dL (ref 30.0–36.0)
MCV: 82 fL (ref 80.0–100.0)
Monocytes Absolute: 0.1 10*3/uL (ref 0.1–1.0)
Monocytes Relative: 3 %
Neutro Abs: 3.5 10*3/uL (ref 1.7–7.7)
Neutrophils Relative %: 81 %
Platelet Count: 159 10*3/uL (ref 150–400)
RBC: 4.66 MIL/uL (ref 3.87–5.11)
RDW: 17 % — ABNORMAL HIGH (ref 11.5–15.5)
WBC Count: 4.4 10*3/uL (ref 4.0–10.5)
nRBC: 0 % (ref 0.0–0.2)

## 2024-07-09 LAB — CMP (CANCER CENTER ONLY)
ALT: 8 U/L (ref 0–44)
AST: 18 U/L (ref 15–41)
Albumin: 4.5 g/dL (ref 3.5–5.0)
Alkaline Phosphatase: 63 U/L (ref 38–126)
Anion gap: 13 (ref 5–15)
BUN: 17 mg/dL (ref 8–23)
CO2: 25 mmol/L (ref 22–32)
Calcium: 10.9 mg/dL — ABNORMAL HIGH (ref 8.9–10.3)
Chloride: 99 mmol/L (ref 98–111)
Creatinine: 0.73 mg/dL (ref 0.44–1.00)
GFR, Estimated: >60 mL/min
Glucose, Bld: 112 mg/dL — ABNORMAL HIGH (ref 70–99)
Potassium: 3.8 mmol/L (ref 3.5–5.1)
Sodium: 136 mmol/L (ref 135–145)
Total Bilirubin: 0.6 mg/dL (ref 0.0–1.2)
Total Protein: 7.8 g/dL (ref 6.5–8.1)

## 2024-07-09 MED ORDER — SODIUM CHLORIDE 0.9 % IV SOLN
516.0000 mg | Freq: Once | INTRAVENOUS | Status: AC
  Administered 2024-07-09: 520 mg via INTRAVENOUS
  Filled 2024-07-09: qty 52

## 2024-07-09 MED ORDER — APREPITANT 130 MG/18ML IV EMUL
130.0000 mg | Freq: Once | INTRAVENOUS | Status: AC
  Administered 2024-07-09: 130 mg via INTRAVENOUS
  Filled 2024-07-09: qty 18

## 2024-07-09 MED ORDER — SODIUM CHLORIDE 0.9 % IV SOLN
50.0000 mg/m2 | Freq: Once | INTRAVENOUS | Status: AC
  Administered 2024-07-09: 93 mg via INTRAVENOUS
  Filled 2024-07-09: qty 9.3

## 2024-07-09 MED ORDER — DEXAMETHASONE 4 MG PO TABS: ORAL_TABLET | ORAL | 0 refills | Status: AC

## 2024-07-09 MED ORDER — DEXAMETHASONE SOD PHOSPHATE PF 10 MG/ML IJ SOLN
10.0000 mg | Freq: Once | INTRAMUSCULAR | Status: AC
  Administered 2024-07-09: 10 mg via INTRAVENOUS
  Filled 2024-07-09: qty 1

## 2024-07-09 MED ORDER — DIPHENHYDRAMINE HCL 50 MG/ML IJ SOLN
25.0000 mg | Freq: Once | INTRAMUSCULAR | Status: AC
  Administered 2024-07-09: 25 mg via INTRAVENOUS
  Filled 2024-07-09: qty 1

## 2024-07-09 MED ORDER — SODIUM CHLORIDE 0.9 % IV SOLN: INTRAVENOUS | Status: DC

## 2024-07-09 MED ORDER — PALONOSETRON HCL INJECTION 0.25 MG/5ML
0.2500 mg | Freq: Once | INTRAVENOUS | Status: AC
  Administered 2024-07-09: 0.25 mg via INTRAVENOUS
  Filled 2024-07-09: qty 5

## 2024-07-09 MED ORDER — FAMOTIDINE IN NACL 20-0.9 MG/50ML-% IV SOLN
20.0000 mg | Freq: Once | INTRAVENOUS | Status: AC
  Administered 2024-07-09: 20 mg via INTRAVENOUS
  Filled 2024-07-09: qty 50

## 2024-07-09 NOTE — Assessment & Plan Note (Addendum)
 The patient was diagnosed with uterine cancer after presentation with postmenopausal bleeding, leading to imaging study and subsequent surgery in October 2025 Final pathology: High-grade endometrioid adenocarcinoma, FIGO grade 3, p53 mutated, overall stage IIc   Treatment cycle 1 was complicated by allergic reaction to paclitaxel  Her treatment is now substituted with docetaxel  The plan would be 6 cycles of chemotherapy She tolerated recent treatment well Will proceed with treatment without delay

## 2024-07-09 NOTE — Progress Notes (Signed)
 [facility] OFFICE PROGRESS NOTE  Patient Care Team: Brandy Rollene BRAVO, MD as PCP - General Brandy Jayson MATSU, MD as PCP - Cardiology (Cardiology)  Assessment & Plan Endometrial cancer [facility]) The patient was diagnosed with uterine cancer after presentation with postmenopausal bleeding, leading to imaging study and subsequent surgery in October 2025 Final pathology: High-grade endometrioid adenocarcinoma, FIGO grade 3, p53 mutated, overall stage IIc   Treatment cycle 1 was complicated by allergic reaction to paclitaxel  Her treatment is now substituted with docetaxel  The plan would be 6 cycles of chemotherapy She tolerated recent treatment well Will proceed with treatment without delay  No orders of the defined types were placed in this encounter.    Brandy Bedford, MD  INTERVAL HISTORY: she returns for treatment follow-up Complications related to previous cycle of chemotherapy included none  PHYSICAL EXAMINATION: ECOG PERFORMANCE STATUS: 0 - Asymptomatic  Lab Results  Component Value Date   CAN125 12.1 03/15/2024      Latest Ref Rng & Units 07/09/2024   10:28 AM 06/17/2024   10:18 AM 05/17/2024    9:23 AM  CBC  WBC 4.0 - 10.5 K/uL 4.4  5.4  4.5   Hemoglobin 12.0 - 15.0 g/dL 87.0  87.6  87.2   Hematocrit 36.0 - 46.0 % 38.2  37.6  38.9   Platelets 150 - 400 K/uL 159  175  248       Chemistry      Component Value Date/Time   NA 136 07/09/2024 1028   NA 137 02/16/2024 1613   K 3.8 07/09/2024 1028   CL 99 07/09/2024 1028   CO2 25 07/09/2024 1028   BUN 17 07/09/2024 1028   BUN 18 02/16/2024 1613   CREATININE 0.73 07/09/2024 1028   CREATININE 0.81 11/20/2019 1457      Component Value Date/Time   CALCIUM  10.9 (H) 07/09/2024 1028   ALKPHOS 63 07/09/2024 1028   AST 18 07/09/2024 1028   ALT 8 07/09/2024 1028   BILITOT 0.6 07/09/2024 1028       Vitals:   07/09/24 1120  BP: (!) 168/85  Pulse: 62  Resp: 18  SpO2: 98%   Filed Weights   07/09/24 1120   Weight: 236 lb 3.2 oz (107.1 kg)   Other relevant data reviewed during this visit included CBC and CMP

## 2024-07-09 NOTE — Patient Instructions (Signed)
 [facility] - A DEPT OF [facility]  Discharge Instructions: Thank you for choosing [facility] to provide your oncology and hematology care.   If you have a lab appointment with the Cancer Center, please go directly to the Cancer Center and check in at the registration area.   Wear comfortable clothing and clothing appropriate for easy access to any Portacath or PICC line.   We strive to give you quality time with your provider. You may need to reschedule your appointment if you arrive late (15 or more minutes).  Arriving late affects you and other patients whose appointments are after yours.  Also, if you miss three or more appointments without notifying the office, you may be dismissed from the clinic at the provider's discretion.      For prescription refill requests, have your pharmacy contact our office and allow 72 hours for refills to be completed.    Today you received the following chemotherapy and/or immunotherapy agents: Docetaxel  (Taxotere ) & Carboplatin  (Paraplatin )   To help prevent nausea and vomiting after your treatment, we encourage you to take your nausea medication as directed.  BELOW ARE SYMPTOMS THAT SHOULD BE REPORTED IMMEDIATELY: *FEVER GREATER THAN 100.4 F (38 C) OR HIGHER *CHILLS OR SWEATING *NAUSEA AND VOMITING THAT IS NOT CONTROLLED WITH YOUR NAUSEA MEDICATION *UNUSUAL SHORTNESS OF BREATH *UNUSUAL BRUISING OR BLEEDING *URINARY PROBLEMS (pain or burning when urinating, or frequent urination) *BOWEL PROBLEMS (unusual diarrhea, constipation, pain near the anus) TENDERNESS IN MOUTH AND THROAT WITH OR WITHOUT PRESENCE OF ULCERS (sore throat, sores in mouth, or a toothache) UNUSUAL RASH, SWELLING OR PAIN  UNUSUAL VAGINAL DISCHARGE OR ITCHING   Items with * indicate a potential emergency and should be followed up as soon as possible or go to the Emergency Department if any problems should occur.  Please show the  CHEMOTHERAPY ALERT CARD or IMMUNOTHERAPY ALERT CARD at check-in to the Emergency Department and triage nurse.  Should you have questions after your visit or need to cancel or reschedule your appointment, please contact [facility] - A DEPT OF [facility]  Dept: [phone]  and follow the prompts.  Office hours are 8:00 a.m. to 4:30 p.m. Monday - Friday. Please note that voicemails left after 4:00 p.m. may not be returned until the following business day.  We are closed weekends and major holidays. You have access to a nurse at all times for urgent questions. Please call the main number to the clinic Dept: [phone] and follow the prompts.   For any non-urgent questions, you may also contact your provider using MyChart. We now offer e-Visits for anyone 43 and older to request care online for non-urgent symptoms. For details visit [email or web address].   Also download the MyChart app! Go to the app store, search MyChart, open the app, select [facility], and log in with your MyChart username and password.

## 2024-07-16 NOTE — Addendum Note (Signed)
 Encounter addended by: Wyatt Leeroy HERO, PA-C on: 07/16/2024 12:23 PM  Actions taken: Clinical Note Signed

## 2024-07-19 ENCOUNTER — Ambulatory Visit: Admitting: Family Medicine

## 2024-07-30 ENCOUNTER — Inpatient Hospital Stay: Admitting: Hematology and Oncology

## 2024-07-30 ENCOUNTER — Inpatient Hospital Stay

## 2024-08-06 ENCOUNTER — Ambulatory Visit: Admitting: Radiation Oncology

## 2024-08-12 ENCOUNTER — Ambulatory Visit: Admitting: Family Medicine

## 2024-08-20 ENCOUNTER — Inpatient Hospital Stay: Admitting: Hematology and Oncology

## 2024-08-20 ENCOUNTER — Inpatient Hospital Stay

## 2024-09-11 ENCOUNTER — Ambulatory Visit: Admitting: Family Medicine

## 2024-11-22 ENCOUNTER — Inpatient Hospital Stay: Admitting: Gynecologic Oncology
